# Patient Record
Sex: Female | Born: 1940
Health system: Southern US, Community
[De-identification: ages and names within clinical notes are randomized; demographics above are authoritative.]

## PROBLEM LIST (undated history)

## (undated) DIAGNOSIS — I482 Chronic atrial fibrillation, unspecified: Secondary | ICD-10-CM

## (undated) DIAGNOSIS — Z8701 Personal history of pneumonia (recurrent): Secondary | ICD-10-CM

## (undated) DIAGNOSIS — R931 Abnormal findings on diagnostic imaging of heart and coronary circulation: Secondary | ICD-10-CM

## (undated) DIAGNOSIS — S838X9A Sprain of other specified parts of unspecified knee, initial encounter: Secondary | ICD-10-CM

## (undated) DIAGNOSIS — J449 Chronic obstructive pulmonary disease, unspecified: Secondary | ICD-10-CM

## (undated) DIAGNOSIS — N189 Chronic kidney disease, unspecified: Secondary | ICD-10-CM

## (undated) DIAGNOSIS — Z7901 Long term (current) use of anticoagulants: Secondary | ICD-10-CM

## (undated) DIAGNOSIS — E049 Nontoxic goiter, unspecified: Secondary | ICD-10-CM

## (undated) DIAGNOSIS — E785 Hyperlipidemia, unspecified: Secondary | ICD-10-CM

## (undated) DIAGNOSIS — IMO0001 Reserved for inherently not codable concepts without codable children: Secondary | ICD-10-CM

## (undated) DIAGNOSIS — I4891 Unspecified atrial fibrillation: Secondary | ICD-10-CM

## (undated) DIAGNOSIS — M1710 Unilateral primary osteoarthritis, unspecified knee: Secondary | ICD-10-CM

## (undated) DIAGNOSIS — I428 Other cardiomyopathies: Secondary | ICD-10-CM

## (undated) DIAGNOSIS — M179 Osteoarthritis of knee, unspecified: Secondary | ICD-10-CM

## (undated) DIAGNOSIS — T148XXA Other injury of unspecified body region, initial encounter: Secondary | ICD-10-CM

## (undated) DIAGNOSIS — J4489 Other specified chronic obstructive pulmonary disease: Secondary | ICD-10-CM

## (undated) DIAGNOSIS — I10 Essential (primary) hypertension: Secondary | ICD-10-CM

## (undated) HISTORY — DX: Abnormal findings on diagnostic imaging of heart and coronary circulation: R93.1

## (undated) HISTORY — DX: Unspecified atrial fibrillation: I48.91

## (undated) HISTORY — DX: Reserved for inherently not codable concepts without codable children: IMO0001

## (undated) HISTORY — DX: Hyperlipidemia, unspecified: E78.5

## (undated) HISTORY — DX: Essential (primary) hypertension: I10

## (undated) HISTORY — DX: Chronic atrial fibrillation, unspecified: I48.20

## (undated) HISTORY — DX: Long term (current) use of anticoagulants: Z79.01

## (undated) HISTORY — DX: Nontoxic goiter, unspecified: E04.9

## (undated) HISTORY — DX: Osteoarthritis of knee, unspecified: M17.9

## (undated) HISTORY — DX: Unilateral primary osteoarthritis, unspecified knee: M17.10

---

## 1968-11-07 HISTORY — PX: BREAST ENHANCEMENT SURGERY: SHX7

## 1976-11-07 HISTORY — PX: VAGINAL HYSTERECTOMY: SUR661

## 1998-03-09 ENCOUNTER — Encounter: Admission: RE | Admit: 1998-03-09 | Discharge: 1998-06-07 | Payer: Self-pay | Admitting: Cardiology

## 1998-10-13 ENCOUNTER — Encounter (HOSPITAL_COMMUNITY): Admission: RE | Admit: 1998-10-13 | Discharge: 1999-01-11 | Payer: Self-pay | Admitting: Internal Medicine

## 1999-01-12 ENCOUNTER — Encounter (HOSPITAL_COMMUNITY): Admission: RE | Admit: 1999-01-12 | Discharge: 1999-04-12 | Payer: Self-pay | Admitting: Internal Medicine

## 2000-10-06 ENCOUNTER — Encounter: Admission: RE | Admit: 2000-10-06 | Discharge: 2001-01-04 | Payer: Self-pay | Admitting: Cardiology

## 2006-08-28 ENCOUNTER — Ambulatory Visit: Payer: Self-pay | Admitting: Internal Medicine

## 2006-11-07 DIAGNOSIS — IMO0001 Reserved for inherently not codable concepts without codable children: Secondary | ICD-10-CM

## 2006-11-07 HISTORY — DX: Reserved for inherently not codable concepts without codable children: IMO0001

## 2007-09-06 ENCOUNTER — Ambulatory Visit: Payer: Self-pay | Admitting: Internal Medicine

## 2007-11-21 ENCOUNTER — Telehealth (INDEPENDENT_AMBULATORY_CARE_PROVIDER_SITE_OTHER): Payer: Self-pay | Admitting: *Deleted

## 2008-06-30 ENCOUNTER — Encounter: Admission: RE | Admit: 2008-06-30 | Discharge: 2008-06-30 | Payer: Self-pay | Admitting: Cardiology

## 2008-07-04 ENCOUNTER — Encounter: Payer: Self-pay | Admitting: Internal Medicine

## 2008-09-04 ENCOUNTER — Ambulatory Visit: Payer: Self-pay | Admitting: Internal Medicine

## 2008-09-04 DIAGNOSIS — J209 Acute bronchitis, unspecified: Secondary | ICD-10-CM | POA: Insufficient documentation

## 2008-09-04 DIAGNOSIS — E1169 Type 2 diabetes mellitus with other specified complication: Secondary | ICD-10-CM

## 2008-09-04 DIAGNOSIS — I428 Other cardiomyopathies: Secondary | ICD-10-CM | POA: Insufficient documentation

## 2008-09-04 DIAGNOSIS — I4811 Longstanding persistent atrial fibrillation: Secondary | ICD-10-CM

## 2008-09-04 DIAGNOSIS — E669 Obesity, unspecified: Secondary | ICD-10-CM | POA: Insufficient documentation

## 2008-09-04 LAB — CONVERTED CEMR LAB: Theophylline Lvl: 4.6 ug/mL — ABNORMAL LOW (ref 10.0–20.0)

## 2008-09-09 ENCOUNTER — Telehealth (INDEPENDENT_AMBULATORY_CARE_PROVIDER_SITE_OTHER): Payer: Self-pay | Admitting: *Deleted

## 2008-11-07 DIAGNOSIS — R931 Abnormal findings on diagnostic imaging of heart and coronary circulation: Secondary | ICD-10-CM

## 2008-11-07 HISTORY — DX: Abnormal findings on diagnostic imaging of heart and coronary circulation: R93.1

## 2009-09-03 ENCOUNTER — Ambulatory Visit: Payer: Self-pay | Admitting: Internal Medicine

## 2009-09-03 DIAGNOSIS — J449 Chronic obstructive pulmonary disease, unspecified: Secondary | ICD-10-CM

## 2010-07-02 ENCOUNTER — Ambulatory Visit: Payer: Self-pay | Admitting: Cardiology

## 2010-08-04 ENCOUNTER — Ambulatory Visit: Payer: Self-pay | Admitting: Cardiology

## 2010-08-18 ENCOUNTER — Ambulatory Visit: Payer: Self-pay | Admitting: Cardiology

## 2010-09-02 ENCOUNTER — Ambulatory Visit: Payer: Self-pay | Admitting: Internal Medicine

## 2010-09-06 ENCOUNTER — Ambulatory Visit: Payer: Self-pay | Admitting: Cardiology

## 2010-10-13 ENCOUNTER — Ambulatory Visit: Payer: Self-pay | Admitting: Cardiology

## 2010-10-27 ENCOUNTER — Ambulatory Visit: Payer: Self-pay | Admitting: Cardiovascular Disease

## 2010-11-11 ENCOUNTER — Ambulatory Visit: Payer: Self-pay | Admitting: Cardiology

## 2010-12-07 NOTE — Assessment & Plan Note (Signed)
Summary: 1 year/ mbw   Primary Provider/Referring Provider:  [name]  CC:  yearly follow up visit-asthma; denies any SOB, wheezing, and attacks; uses rescue inhaler occasionally.[name]  History of Present Illness: HISTORY:  A 1-year followup.  Has had flu shots and has had pneumococcal vaccine twice.  Says breathing fine.  Only occasionally needs inhaler. No flare-ups or significant exacerbations.  She says a stress test went well recently.  We have reviewed her medications, particularly in the context of her atrial fibrillation.   09/04/08- Asthmatic bronchitis. Good year, no acute issues. Takes albuterol 4mg  tab every other day on average.Well tolerated- discussed heart rhythm  Rare use of Combivent. Discussed H1N1. had Flu vax.   September 03, 2009-  Asthmatic bronchitis She feels she is doing well and still follows closely with Dr Patty Sermons. Recent  cold shaken off without extra help. Uses her inhaler only occasionally. Denies nasal drainage or congestion. We discussed her vaccine status- has had Pneumovax twice. Remains on warfarin for chronic atirial fib.  September 02, 2010- Asthmatic bronchitis Nurse-CC: yearly follow up visit-asthma; denies any SOB, wheezing, attacks; uses rescue inhaler occasionally. Former smoker. Now working 3 days/ week at [employer]. She avoided summer heat and winter cold. some dry cough occasionally. Takes a little mucinex every other day. Uses daily theophylline. Rare need for rescue inhaler and infrequent use of Spiriva. She takes 1/2 albuterol tab daily. Had flu shot. Discussed theophylline and albuterol as oral meds.  Consider PFT, theophylline level, CXR on return.  Asthma History    Initial Asthma Severity Rating:    Age range: 12+ years    Symptoms: 0-2 days/week    Nighttime Awakenings: 0-2/month    Interferes w/ normal activity: no limitations    SABA use (not for EIB): 0-2 days/week    Asthma Severity Assessment:  Intermittent   Preventive Screening-Counseling & Management  Alcohol-Tobacco     Smoking Status: quit     Year Quit: 1989     Pack years: 66yrs, 2ppd  Current Medications (verified): 1)  Theophylline Cr 200 Mg Xr12h-Tab (Theophylline) .... Take 1 Tablet By Mouth Once A Day 2)  Albuterol Sulfate 4 Mg Tabs (Albuterol Sulfate) .[name].. 1 By Mouth Two Times A Day As Needed For Asthma 3)  Spiriva Handihaler 18 Mcg Caps (Tiotropium Bromide Monohydrate) .... Inhale Contents of 1 Capsule Once A Day 4)  Combivent 103-18 Mcg/act Aero (Ipratropium-Albuterol) .... Inhale 2 Puffs Every 4-6 Hours As Needed 5)  Bisoprolol Fumarate 5 Mg Tabs (Bisoprolol Fumarate) .... Take 1 Tablet By Mouth Once A Day 6)  Bayer Aspirin Ec Low Dose 81 Mg Tbec (Aspirin) .... Take 1 Tablet By Mouth Once A Day 7)  Warfarin Sodium 5 Mg Tabs (Warfarin Sodium) .... Per Clinic 8)  Furosemide 40 Mg Tabs (Furosemide) .... Take 1 Tablet By Mouth Once A Day 9)  Diovan Hct 320-25 Mg Tabs (Valsartan-Hydrochlorothiazide) .... Take 1 Tablet By Mouth Once A Day 10)  Lipitor 80 Mg Tabs (Atorvastatin Calcium) .... Take 1 Tab By Mouth At Bedtime 11)  Metformin Hcl 1000 Mg Tabs (Metformin Hcl) .... Take 1 Tablet By Mouth Two Times A Day 12)  Metanx 2.8-25-2 Mg Tabs (L-Methylfolate-B6-B12) .... Take 1 Tablet By Mouth Two Times A Day 13)  Actos 45 Mg Tabs (Pioglitazone Hcl) .... Take 1 Tablet By Mouth Once A Day 14)  Januvia 100 Mg Tabs (Sitagliptin Phosphate) .... Take 1 Tablet By Mouth Once A Day 15)  Lanoxin 0.125 Mg Tabs (Digoxin) .Marland KitchenMarland KitchenMarland Kitchen  Take 1 Tablet By Mouth Once A Day 16)  Vitamin D 1000 Unit Tabs (Cholecalciferol) .... Take 1 Tablet By Mouth Once A Day 17)  Mucinex Dm 30-600 Mg Xr12h-Tab (Dextromethorphan-Guaifenesin) .[name].. 1-2 Every 12 Hours As Needed 18)  Calcium Carbonate-Vitamin D 600-400 Mg-Unit  Tabs (Calcium Carbonate-Vitamin D) .... Take 1 Tablet By Mouth Two Times A Day 19)  Fish Oil 1000 Mg Caps (Omega-3 Fatty Acids) .... Take 1  Capsule By Mouth Once A Day 20)  Multivitamins   Tabs (Multiple Vitamin) .... Take 1 Tablet By Mouth Once A Day 21)  Black Cohosh 40 Mg Caps (Black Cohosh) .[name].. 1 Once Daily 22)  Vitamin E 200 Unit Caps (Vitamin E) .[name].. 1 Once Daily 23)  Super B Complex  Tabs (B Complex-C) .[name].. 1 Once Daily 24)  Flax Seed Oil 1000 Mg Caps (Flaxseed (Linseed)) .[name].. 1 Once Daily 25)  Lantus 100 Unit/ml Soln (Insulin Glargine) .... Inject 15 Units At Bedtime  Allergies (verified): 1)  ! Sulfa  Past History:  Past Medical History: Last updated: October 02, 2008 DIABETES MELLITUS (ICD-250.00) CARDIOMYOPATHY (ICD-425.4) ATRIAL FIBRILLATION (ICD-427.31) Hx of ASTHMATIC BRONCHITIS, ACUTE (ICD-466.0)  Past Surgical History: Last updated: 09/03/2009 Tonsillectomy Total Abdominal Hysterectomy  Family History: Last updated: 10/02/2008 father died of congestive heart failure, emphysema, arthritis mother died of stroke, arthritis brother died of heart disease, diabetes 2 other siblings healthy  Social History: Last updated: 10/02/2008 former smoker occasionally exercises drinks 1 cup caffeine a day widowed 1 child rare etoh part time at [employer]  Risk Factors: Smoking Status: quit (09/02/2010)  Review of Systems      See HPI       The patient complains of non-productive cough.  The patient denies shortness of breath with activity, shortness of breath at rest, productive cough, coughing up blood, chest pain, irregular heartbeats, acid heartburn, indigestion, loss of appetite, weight change, abdominal pain, difficulty swallowing, sore throat, tooth/dental problems, headaches, nasal congestion/difficulty breathing through nose, hand/feet swelling, rash, change in color of mucus, and fever.    Vital Signs:  Patient profile:   70 year old female Height:      64 inches Weight:      183.38 pounds BMI:     31.59 O2 Sat:      99 % on Room air Pulse rate:   71 / minute BP sitting:   110 / 68  (left  arm) Cuff size:   regular  Vitals Entered By: Reynaldo Minium CMA (September 02, 2010 10:03 AM)  O2 Flow:  Room air CC: yearly follow up visit-asthma; denies any SOB, wheezing, attacks; uses rescue inhaler occasionally.   Physical Exam  Additional Exam:  General: A/Ox3; pleasant and cooperative, NAD, SKIN: no rash, lesions NODES: no lymphadenopathy HEENT: [place]/AT, EOM- WNL, Conjuctivae- clear, PERRLA, TM-WNL, Nose- clear, Throat- clear and wnl, Mallampati II, no erythema NECK: Supple w/ fair ROM, JVD- none, normal carotid impulses w/o bruits Thyroid- normal to palpation CHEST: Clear to P&A, diminished, unlabored without cough or wheeze. HEART: IRR (AF), no m/g/r heard. Pulse remains irregular. ABDOMEN: Soft and nl; AOZ:HYQM, nl pulses, no edema . walking shoe on right foot for a "corn" NEURO: Grossly intact to observation      Impression & Recommendations:  Problem # 1:  ASTHMA (ICD-493.90) Mild intermittent and very well controlled now. It is interesting that she does so well with oral meds. We discussed her meds, especially systemic oral bronchodilators,  and chose to leave well enough alone.   Medications  Added to Medication List This Visit: 1)  Lantus 100 Unit/ml Soln (Insulin glargine) .... Inject 15 units at bedtime  Other Orders: Est. Patient Level III (16109)  Patient Instructions: 1)  Please schedule a follow-up appointment in 1 year. 2)  Meds refilled Prescriptions: COMBIVENT 103-18 MCG/ACT AERO (IPRATROPIUM-ALBUTEROL) inhale 2 puffs every 4-6 hours as needed  #15 Gram x prn   Entered and Authorized by:   Waymon Budge MD   Signed by:   Waymon Budge MD on 09/02/2010   Method used:   Print then Give to Patient   RxID:   [id number] SPIRIVA HANDIHALER 18 MCG CAPS (TIOTROPIUM BROMIDE MONOHYDRATE) Inhale contents of 1 capsule once a day  #30 x prn   Entered and Authorized by:   Waymon Budge MD   Signed by:   Waymon Budge MD on 09/02/2010   Method used:    Print then Give to Patient   RxID:   [id number] ALBUTEROL SULFATE 4 MG TABS (ALBUTEROL SULFATE) 1 by mouth two times a day as needed for asthma  #180 Tablet x prn   Entered and Authorized by:   Waymon Budge MD   Signed by:   Waymon Budge MD on 09/02/2010   Method used:   Print then Give to Patient   RxID:   [id number] THEOPHYLLINE CR 200 MG XR12H-TAB (THEOPHYLLINE) Take 1 tablet by mouth once a day  #180 Tablet x prn   Entered and Authorized by:   Waymon Budge MD   Signed by:   Waymon Budge MD on 09/02/2010   Method used:   Print then Give to Patient   RxID:   [id number]    Immunization History:  Influenza Immunization History:    Influenza:  historical (08/10/2010)

## 2010-12-10 ENCOUNTER — Other Ambulatory Visit (INDEPENDENT_AMBULATORY_CARE_PROVIDER_SITE_OTHER): Payer: Medicare Other

## 2010-12-10 DIAGNOSIS — I4891 Unspecified atrial fibrillation: Secondary | ICD-10-CM

## 2011-01-07 ENCOUNTER — Ambulatory Visit (INDEPENDENT_AMBULATORY_CARE_PROVIDER_SITE_OTHER): Payer: Medicare Other | Admitting: Cardiology

## 2011-01-07 DIAGNOSIS — Z79899 Other long term (current) drug therapy: Secondary | ICD-10-CM

## 2011-01-07 DIAGNOSIS — I4891 Unspecified atrial fibrillation: Secondary | ICD-10-CM

## 2011-01-07 DIAGNOSIS — Z7901 Long term (current) use of anticoagulants: Secondary | ICD-10-CM

## 2011-01-07 DIAGNOSIS — E119 Type 2 diabetes mellitus without complications: Secondary | ICD-10-CM

## 2011-01-07 DIAGNOSIS — I119 Hypertensive heart disease without heart failure: Secondary | ICD-10-CM

## 2011-02-05 ENCOUNTER — Other Ambulatory Visit: Payer: Self-pay | Admitting: Cardiology

## 2011-02-05 DIAGNOSIS — E7800 Pure hypercholesterolemia, unspecified: Secondary | ICD-10-CM

## 2011-02-07 ENCOUNTER — Ambulatory Visit (INDEPENDENT_AMBULATORY_CARE_PROVIDER_SITE_OTHER): Payer: Medicare Other | Admitting: *Deleted

## 2011-02-07 DIAGNOSIS — I4891 Unspecified atrial fibrillation: Secondary | ICD-10-CM

## 2011-02-07 DIAGNOSIS — Z7901 Long term (current) use of anticoagulants: Secondary | ICD-10-CM

## 2011-02-07 LAB — POCT INR
INR: 1.9
INR: 1.9

## 2011-03-10 ENCOUNTER — Ambulatory Visit (INDEPENDENT_AMBULATORY_CARE_PROVIDER_SITE_OTHER): Payer: Medicare Other | Admitting: *Deleted

## 2011-03-10 DIAGNOSIS — I4891 Unspecified atrial fibrillation: Secondary | ICD-10-CM

## 2011-03-10 LAB — POCT INR: INR: 2.8

## 2011-03-17 ENCOUNTER — Encounter: Payer: Medicare Other | Admitting: *Deleted

## 2011-03-22 NOTE — Assessment & Plan Note (Signed)
[facility]                             PULMONARY OFFICE NOTE   NAME:Renee Ray                     MRN:          [id number]  DATE:09/06/2007                            DOB:          08-21-1941    PULMONARY FOLLOWUP   PROBLEMS:  1. Asthmatic bronchitis.  2. Atrial fibrillation/cardiomyopathy/Coumadin.  3. Diabetes.   HISTORY:  A 1-year followup.  Has had flu shots and has had pneumococcal  vaccine twice.  Says breathing fine.  Only occasionally needs inhaler.  No flare-ups or significant exacerbations.  She says a stress test went  well recently.  We have reviewed her medications, particularly in the  context of her atrial fibrillation.   MEDICATIONS:  1. Theophylline 200 mg once daily.  2. Albuterol 4 mg tablet once daily.  3. Spiriva used p.r.n.  4. Combivent inhaler used as a rescue inhaler maybe once a week.  5. Bisoprolol 5 mg.  6. Aspirin 81 mg.  7. Digoxin 125 mcg.  8. Warfarin 5 mg.  9. Furosemide 40 mg.  10.Diovan hydrochlorothiazide 160/12.5.  11.Lipitor 40 mg.  12.Glyburide 2.5 mg b.i.d.  13.Metanx b.i.d.   She also takes a variety of vitamins and herbal medications, including  occasional Mucinex.   DRUG INTOLERANCES:  SULFA.   OBJECTIVE:  Weight 185 pounds, BP 124/80, pulse 79, room air saturation  98%.  Pulse is very regular to palpation, but I think represents well-  controlled atrial fibrillation.  There is no neck vein distension or  edema.  Chest is quiet and clear.  She looks happy and comfortable.   IMPRESSION:  Stable asthma/chronic obstructive pulmonary disease.  Atrial fibrillation with well-controlled ventricular response rate.   PLAN:  Chest x-ray.  We refilled her medications suggesting that she try  tapering away from the albuterol and Theophylline tablets.  She has been  so very stable that we are reluctant to rock the boat, but she  understands the advantage of getting away from these if  possible.  We  also discussed the change to [facility] propellants and, as a replacement for  Combivent, I am giving sample Xopenex HFA 2 puffs q.i.d. p.r.n.  We have  refilled Spiriva.  Schedule return in 1 year, earlier p.r.n.     Clinton D. Maple Hudson, MD, Tonny Bollman, FACP  Electronically Signed    CDY/MedQ  DD: 09/07/2007  DT: 09/07/2007  Job #: [id number]   cc:   Cassell Clement, M.D.

## 2011-03-25 NOTE — Assessment & Plan Note (Signed)
[facility]                               PULMONARY OFFICE NOTE   NAME:Renee Ray, Renee Ray                     MRN:          [id number]  DATE:08/28/2006                            DOB:          06-13-1941    PROBLEM LIST:  1. Asthmatic bronchitis.  2. Atrial fibrillation/cardiomyopathy/Coumadin.  3. Diabetes.   HISTORY:  This is a former smoker previously followed at [facility] and coming now to establish at this practice with me for continuity.  She is the widow of Dr. Ples Specter. She had stopped smoking many years  ago and has been doing extremely well in recent years although at one point  she was in such severe distress at home that her husband seriously  considered doing an emergency tracheostomy in the kitchen. Fortunately he  did not. She had participated in pulmonary rehabilitation. She now feels her  breathing is stable on present therapy and she has been working part time as  a Heritage manager at [employer].   PAST HISTORY:  1. Gallstone pancreatitis in 1994.  2. Spontaneous atrial fibrillation starting around 1992.  3. Removal of silicone breast implants.  4. Hysterectomy.  5. Pulmonary function tests in 2005 had included an FVC of 4600 (85%),      FEV1 of 1600 (67%), with ratio 0.62 consistent with moderate      obstructive airways disease at that time.  6. She has gotten annual flu shots. I do not have dates for Pneumococcal      vaccines administered.   MEDICATIONS:  1. Bisoprolol 5 mg.  2. Combivent 2 puffs x4 p.r.n.  3. Digoxin 125 mcg.  4. Diovan/HCT 160/12.5.  5. Furosemide 40 mg.  6. Glimepiride 2 mg.  7. Hydrocortisone skin cream 0.2%.  8. Metformin 1000 mg.  9. Spiriva inhaler one puff daily.  10.Theophylline 200 mg b.i.d.  11.VoSpire 4 mg b.i.d. p.r.n.  12.Warfarin 5 mg as directed.  13.She has had shingle's vaccine.   ALLERGIES:  DRUG INTOLERANCE OF SULFA.   OBJECTIVE:  VITAL SIGNS: Weight 186  pounds. Blood pressure 110/64, pulse  regular, 112. Room air saturation 97%.  GENERAL:  She looks alert and comfortable.  NECK: There is no neck vein distention or peripheral edema. I do not find  adenopathy, distention or peripheral edema.  LUNGS:  Sounds are somewhat diminished and may be a little bit slow on  expiration but do not hear rales, wheeze, crackles or rhonchi and she is not  using accessory muscles.  CARDIAC:  Heart sounds are slightly irregular without murmur or gallop.  There is no obvious acute process.   IMPRESSION:  1.Stable mild to moderate COPD.  1. Chronic atrial fibrillation with controlled ventricular response.   PLAN:  1. We discussed medications and follow up status.  2. Flu vaccine.  3. Schedule return for 1 year follow up, earlier p.r.n.     Clinton D. Maple Hudson, MD, FCCP, FACP    CDY/MedQ  DD: 09/01/2006  DT: 09/03/2006  Job #: [id number]   cc:   Cassell Clement, M.D.

## 2011-04-07 ENCOUNTER — Ambulatory Visit (INDEPENDENT_AMBULATORY_CARE_PROVIDER_SITE_OTHER): Payer: Medicare Other | Admitting: *Deleted

## 2011-04-07 DIAGNOSIS — I4891 Unspecified atrial fibrillation: Secondary | ICD-10-CM

## 2011-04-07 LAB — POCT INR: INR: 2.4

## 2011-05-03 ENCOUNTER — Telehealth: Payer: Self-pay | Admitting: *Deleted

## 2011-05-03 NOTE — Telephone Encounter (Signed)
Patient phoned requesting to cancel INR for 6/28 since she had an appointment for INR & OV 7/9.  Discussed with Dr Patty Sermons and ok to (last INR 2.4)

## 2011-05-05 ENCOUNTER — Encounter: Payer: Medicare Other | Admitting: *Deleted

## 2011-05-06 ENCOUNTER — Encounter: Payer: Self-pay | Admitting: Cardiology

## 2011-05-16 ENCOUNTER — Encounter (INDEPENDENT_AMBULATORY_CARE_PROVIDER_SITE_OTHER): Payer: Medicare Other | Admitting: *Deleted

## 2011-05-16 ENCOUNTER — Ambulatory Visit (INDEPENDENT_AMBULATORY_CARE_PROVIDER_SITE_OTHER): Payer: Medicare Other | Admitting: Cardiology

## 2011-05-16 ENCOUNTER — Encounter: Payer: Self-pay | Admitting: Cardiology

## 2011-05-16 VITALS — BP 120/70 | HR 80 | Wt 177.0 lb

## 2011-05-16 DIAGNOSIS — I4891 Unspecified atrial fibrillation: Secondary | ICD-10-CM

## 2011-05-16 DIAGNOSIS — I10 Essential (primary) hypertension: Secondary | ICD-10-CM

## 2011-05-16 DIAGNOSIS — I119 Hypertensive heart disease without heart failure: Secondary | ICD-10-CM | POA: Insufficient documentation

## 2011-05-16 DIAGNOSIS — E119 Type 2 diabetes mellitus without complications: Secondary | ICD-10-CM

## 2011-05-16 LAB — POCT INR: INR: 3.2

## 2011-05-16 MED ORDER — LOSARTAN POTASSIUM-HCTZ 100-25 MG PO TABS: 1.0000 | ORAL_TABLET | Freq: Every day | ORAL | Status: DC

## 2011-05-16 NOTE — Assessment & Plan Note (Signed)
The patient has a history of established atrial fibrillation.  She had a remote history of unsuccessful attempts at cardioversion.  She has not been experiencing any chest pain or increased shortness of breath.  She is on Coumadin.  She's not had any thromboembolic events.

## 2011-05-16 NOTE — Assessment & Plan Note (Signed)
The patient has a past history of essential hypertension.  She had been on Diovan HCT but this is becoming expensive for her.  For this reason we are switching her to losartan HCT .  The patient is not having any headaches or dizzy spells.  He's had no syncope.

## 2011-05-16 NOTE — Progress Notes (Signed)
Renee Ray Date of Birth:  [birth year] [facility] [address]   [address] [place], [place]  [address] [phone]           Fax   [phone]  HPI: This pleasant 70 year old woman is seen for a four-month followup office visit.  She has a history of established atrial fibrillation.  She also has a history of essential hypertension and diabetes she's been feeling well with no new cardiac symptoms.  She denies chest pain or shortness of breath.  she does not have any history of ischemic heart disease.  She had a normal adenosine Cardiolite stress test on 07/05/07.Her last echocardiogram was over 04/30/09 and showed slightly enlarged left atrium and normal left ventricular systolic function and normal pulmonary artery pressure  Current Outpatient Prescriptions  Medication Sig Dispense Refill  . aspirin 81 MG tablet Take 81 mg by mouth daily.        [name] atorvastatin (LIPITOR) 80 MG tablet TAKE 1 TABLET BY MOUTH EVERY DAY  30 tablet  11  . b complex vitamins capsule Take 1 capsule by mouth daily.        . bisoprolol (ZEBETA) 5 MG tablet Take 5 mg by mouth daily.        [name] BLACK COHOSH PO Take by mouth daily.        . digoxin (LANOXIN) 0.125 MG tablet Take 125 mcg by mouth daily.        . fish oil-omega-3 fatty acids 1000 MG capsule Take 1 g by mouth daily.        . Flaxseed, Linseed, (FLAXSEED OIL PO) Take by mouth daily.        . furosemide (LASIX) 40 MG tablet Take 40 mg by mouth daily.        . insulin glargine (LANTUS) 100 UNIT/ML injection Inject into the skin at bedtime. Taking 24units daily      . Ipratropium-Albuterol (COMBIVENT IN) Inhale into the lungs as needed.        . metFORMIN (GLUCOPHAGE) 1000 MG tablet Take 1,000 mg by mouth 2 (two) times daily with a meal.        . Multiple Vitamin (MULTIVITAMIN) tablet Take 1 tablet by mouth daily.        . sitaGLIPtin (JANUVIA) 100 MG tablet Take 100 mg by mouth daily.        . theophylline (THEOPHYLLINE) 200  MG 12 hr tablet Take 200 mg by mouth daily.        [name] tiotropium (SPIRIVA) 18 MCG inhalation capsule Place 18 mcg into inhaler and inhale as needed.        . warfarin (COUMADIN) 5 MG tablet Take 5 mg by mouth as directed.        [name] DISCONTD: valsartan-hydrochlorothiazide (DIOVAN-HCT) 320-25 MG per tablet Take 1 tablet by mouth daily.        [name] losartan-hydrochlorothiazide (HYZAAR) 100-25 MG per tablet Take 1 tablet by mouth daily.  90 tablet  3  . DISCONTD: pioglitazone (ACTOS) 45 MG tablet Take 45 mg by mouth daily.          Allergies  Allergen Reactions  . Sulfonamide Derivatives     REACTION: thrush    Patient Active Problem List  Diagnoses  . DIABETES MELLITUS  . CARDIOMYOPATHY  . ATRIAL FIBRILLATION  . ASTHMATIC BRONCHITIS, ACUTE  . ASTHMA  . Benign hypertensive heart disease without heart failure    History  Smoking status  . Former Smoker  .  Quit date: 05/05/1996  Smokeless tobacco  . Not on file    History  Alcohol Use No    Family History  Problem Relation Age of Onset  . Arrhythmia Mother   . Heart failure Mother   . Heart attack Father   . Diabetes Brother     Review of Systems: The patient denies any heat or cold intolerance.  No weight gain or weight loss.  The patient denies headaches or blurry vision.  There is no cough or sputum production.  The patient denies dizziness.  There is no hematuria or hematochezia.  The patient denies any muscle aches or arthritis.  The patient denies any rash.  The patient denies frequent falling or instability.  There is no history of depression or anxiety.  All other systems were reviewed and are negative.   Physical Exam: Filed Vitals:   05/16/11 0842  BP: 120/70  Pulse: 80  The general appearance feels a well-developed well-nourished woman in no distress.Pupils equal and reactive.   Extraocular Movements are full.  There is no scleral icterus.  The mouth and pharynx are normal.  The neck is supple.  The carotids  reveal no bruits.  The jugular venous pressure is normal.  The thyroid is not enlarged.  There is no lymphadenopathy.The chest is clear to percussion and auscultation. There are no rales or rhonchi. Expansion of the chest is symmetrical.  The heart reveals an irregular rhythm.  No murmur gallop or rub.The abdomen is soft and nontender. Bowel sounds are normal. The liver and spleen are not enlarged. There Are no abdominal masses. There are no bruits.The pedal pulses are good.  There is no phlebitis or edema.  There is no cyanosis or clubbing.Strength is normal and symmetrical in all extremities.  There is no lateralizing weakness.  There are no sensory deficits.The skin is warm and dry.  There is no rash.    Assessment / Plan: The patient appears to be doing well.  She will continue same medication.  We are switching her from Diovan HCT to losartan HCT because of cost.   recheck in 4 months for followup office visit

## 2011-05-16 NOTE — Assessment & Plan Note (Signed)
The patient is now followed for diabetes by [name].  He recently stopped her Actos.  She was pleased about that because it appeared to be preventing her from losing weight.  Now her weight has started to come down to

## 2011-05-17 ENCOUNTER — Ambulatory Visit (INDEPENDENT_AMBULATORY_CARE_PROVIDER_SITE_OTHER): Payer: Medicare Other | Admitting: *Deleted

## 2011-05-17 DIAGNOSIS — I4891 Unspecified atrial fibrillation: Secondary | ICD-10-CM

## 2011-06-13 ENCOUNTER — Ambulatory Visit (INDEPENDENT_AMBULATORY_CARE_PROVIDER_SITE_OTHER): Payer: Medicare Other | Admitting: *Deleted

## 2011-06-13 ENCOUNTER — Encounter: Payer: Medicare Other | Admitting: *Deleted

## 2011-06-13 DIAGNOSIS — I4891 Unspecified atrial fibrillation: Secondary | ICD-10-CM

## 2011-06-13 LAB — POCT INR: INR: 2.8

## 2011-06-24 ENCOUNTER — Other Ambulatory Visit: Payer: Self-pay | Admitting: *Deleted

## 2011-06-24 DIAGNOSIS — I4891 Unspecified atrial fibrillation: Secondary | ICD-10-CM

## 2011-06-24 MED ORDER — DIGOXIN 125 MCG PO TABS: 125.0000 ug | ORAL_TABLET | Freq: Every day | ORAL | Status: DC

## 2011-07-14 ENCOUNTER — Ambulatory Visit (INDEPENDENT_AMBULATORY_CARE_PROVIDER_SITE_OTHER): Payer: Medicare Other | Admitting: *Deleted

## 2011-07-14 DIAGNOSIS — I4891 Unspecified atrial fibrillation: Secondary | ICD-10-CM

## 2011-08-05 ENCOUNTER — Other Ambulatory Visit: Payer: Self-pay | Admitting: *Deleted

## 2011-08-05 DIAGNOSIS — I119 Hypertensive heart disease without heart failure: Secondary | ICD-10-CM

## 2011-08-05 MED ORDER — FUROSEMIDE 40 MG PO TABS: 40.0000 mg | ORAL_TABLET | Freq: Every day | ORAL | Status: DC

## 2011-08-05 NOTE — Telephone Encounter (Signed)
Refilled meds per fax request.  

## 2011-08-09 ENCOUNTER — Telehealth: Payer: Self-pay | Admitting: Cardiology

## 2011-08-09 ENCOUNTER — Inpatient Hospital Stay (HOSPITAL_COMMUNITY)
Admission: EM | Admit: 2011-08-09 | Discharge: 2011-08-15 | DRG: 682 | Disposition: A | Discharge status: Home / Self Care | Payer: Medicare Other | Source: Ambulatory Visit | Attending: Pulmonary Disease | Admitting: Pulmonary Disease

## 2011-08-09 ENCOUNTER — Emergency Department (HOSPITAL_COMMUNITY): Payer: Medicare Other

## 2011-08-09 DIAGNOSIS — N179 Acute kidney failure, unspecified: Principal | ICD-10-CM | POA: Diagnosis present

## 2011-08-09 DIAGNOSIS — M79609 Pain in unspecified limb: Secondary | ICD-10-CM

## 2011-08-09 DIAGNOSIS — E872 Acidosis, unspecified: Secondary | ICD-10-CM | POA: Diagnosis present

## 2011-08-09 DIAGNOSIS — J441 Chronic obstructive pulmonary disease with (acute) exacerbation: Secondary | ICD-10-CM | POA: Diagnosis present

## 2011-08-09 DIAGNOSIS — J9600 Acute respiratory failure, unspecified whether with hypoxia or hypercapnia: Secondary | ICD-10-CM | POA: Diagnosis present

## 2011-08-09 DIAGNOSIS — M6282 Rhabdomyolysis: Secondary | ICD-10-CM | POA: Diagnosis present

## 2011-08-09 DIAGNOSIS — A498 Other bacterial infections of unspecified site: Secondary | ICD-10-CM | POA: Diagnosis present

## 2011-08-09 DIAGNOSIS — Z7901 Long term (current) use of anticoagulants: Secondary | ICD-10-CM

## 2011-08-09 DIAGNOSIS — J45901 Unspecified asthma with (acute) exacerbation: Secondary | ICD-10-CM | POA: Diagnosis present

## 2011-08-09 DIAGNOSIS — Z7982 Long term (current) use of aspirin: Secondary | ICD-10-CM

## 2011-08-09 DIAGNOSIS — A419 Sepsis, unspecified organism: Secondary | ICD-10-CM | POA: Diagnosis present

## 2011-08-09 DIAGNOSIS — I4891 Unspecified atrial fibrillation: Secondary | ICD-10-CM | POA: Diagnosis present

## 2011-08-09 DIAGNOSIS — I428 Other cardiomyopathies: Secondary | ICD-10-CM | POA: Diagnosis present

## 2011-08-09 DIAGNOSIS — J159 Unspecified bacterial pneumonia: Secondary | ICD-10-CM

## 2011-08-09 DIAGNOSIS — E860 Dehydration: Secondary | ICD-10-CM | POA: Diagnosis present

## 2011-08-09 DIAGNOSIS — Z794 Long term (current) use of insulin: Secondary | ICD-10-CM

## 2011-08-09 DIAGNOSIS — J189 Pneumonia, unspecified organism: Secondary | ICD-10-CM | POA: Diagnosis present

## 2011-08-09 DIAGNOSIS — J449 Chronic obstructive pulmonary disease, unspecified: Secondary | ICD-10-CM

## 2011-08-09 DIAGNOSIS — M7989 Other specified soft tissue disorders: Secondary | ICD-10-CM

## 2011-08-09 DIAGNOSIS — M629 Disorder of muscle, unspecified: Secondary | ICD-10-CM

## 2011-08-09 DIAGNOSIS — N390 Urinary tract infection, site not specified: Secondary | ICD-10-CM | POA: Diagnosis present

## 2011-08-09 DIAGNOSIS — E119 Type 2 diabetes mellitus without complications: Secondary | ICD-10-CM | POA: Diagnosis present

## 2011-08-09 LAB — URINALYSIS, ROUTINE W REFLEX MICROSCOPIC
Glucose, UA: NEGATIVE mg/dL
Specific Gravity, Urine: 1.015 (ref 1.005–1.030)
Urobilinogen, UA: 0.2 mg/dL (ref 0.0–1.0)
pH: 5.5 (ref 5.0–8.0)

## 2011-08-09 LAB — COMPREHENSIVE METABOLIC PANEL
ALT: 366 U/L — ABNORMAL HIGH (ref 0–35)
AST: 955 U/L — ABNORMAL HIGH (ref 0–37)
Albumin: 3.1 g/dL — ABNORMAL LOW (ref 3.5–5.2)
Alkaline Phosphatase: 100 U/L (ref 39–117)
Calcium: 9.2 mg/dL (ref 8.4–10.5)
GFR calc Af Amer: 10 mL/min — ABNORMAL LOW (ref >90)
Potassium: 4 mEq/L (ref 3.5–5.1)
Sodium: 138 mEq/L (ref 135–145)
Total Protein: 7.6 g/dL (ref 6.0–8.3)

## 2011-08-09 LAB — DIFFERENTIAL
Eosinophils Absolute: 0 10*3/uL (ref 0.0–0.7)
Eosinophils Relative: 0 % (ref 0–5)
Lymphocytes Relative: 9 % — ABNORMAL LOW (ref 12–46)
Lymphs Abs: 1.2 10*3/uL (ref 0.7–4.0)
Monocytes Absolute: 1.7 10*3/uL — ABNORMAL HIGH (ref 0.1–1.0)
Monocytes Relative: 13 % — ABNORMAL HIGH (ref 3–12)
Myelocytes: 0 %
Neutro Abs: 10.2 10*3/uL — ABNORMAL HIGH (ref 1.7–7.7)
Neutrophils Relative %: 78 % — ABNORMAL HIGH (ref 43–77)
nRBC: 0 /100 WBC

## 2011-08-09 LAB — POCT I-STAT TROPONIN I

## 2011-08-09 LAB — URINE MICROSCOPIC-ADD ON

## 2011-08-09 LAB — CBC
HCT: 33.8 % — ABNORMAL LOW (ref 36.0–46.0)
MCH: 25.7 pg — ABNORMAL LOW (ref 26.0–34.0)
MCV: 78.2 fL (ref 78.0–100.0)
RDW: 15.8 % — ABNORMAL HIGH (ref 11.5–15.5)
WBC: 13.1 10*3/uL — ABNORMAL HIGH (ref 4.0–10.5)

## 2011-08-09 LAB — MRSA PCR SCREENING: MRSA by PCR: NEGATIVE

## 2011-08-09 NOTE — Telephone Encounter (Signed)
Agree with advice given.  Dr. Arnoldo Lenis, urgent care called me and is sending her to the emergency room.  Her white count is 18,000, and she is very short of breath

## 2011-08-09 NOTE — Telephone Encounter (Signed)
Spoke with patient and she has had a fever since Saturday with Tylenol.  Larey Seat in her bathroom Sunday night and was there all night before she was able to get someone to come over.  Had to call 911 for them to help her up.  Very sore and still has chest congestion.  Advised for her to go to urgent care.  They would be able to do xray, check for flu, and check her INR.  Stated she would.  Will call back Thursday with update

## 2011-08-09 NOTE — Telephone Encounter (Signed)
Pt has a virus and has had it since Saturday and she wants to know what to do. She has only taken tylenol

## 2011-08-10 ENCOUNTER — Inpatient Hospital Stay (HOSPITAL_COMMUNITY): Payer: Medicare Other

## 2011-08-10 DIAGNOSIS — I369 Nonrheumatic tricuspid valve disorder, unspecified: Secondary | ICD-10-CM

## 2011-08-10 LAB — BASIC METABOLIC PANEL
CO2: 18 mEq/L — ABNORMAL LOW (ref 19–32)
Calcium: 8 mg/dL — ABNORMAL LOW (ref 8.4–10.5)
Creatinine, Ser: 4.83 mg/dL — ABNORMAL HIGH (ref 0.50–1.10)
Glucose, Bld: 173 mg/dL — ABNORMAL HIGH (ref 70–99)

## 2011-08-10 LAB — GLUCOSE, CAPILLARY
Glucose-Capillary: 247 mg/dL — ABNORMAL HIGH (ref 70–99)
Glucose-Capillary: 252 mg/dL — ABNORMAL HIGH (ref 70–99)

## 2011-08-10 LAB — CBC
Hemoglobin: 9.3 g/dL — ABNORMAL LOW (ref 12.0–15.0)
MCH: 25.1 pg — ABNORMAL LOW (ref 26.0–34.0)
MCHC: 32.5 g/dL (ref 30.0–36.0)
MCV: 77.3 fL — ABNORMAL LOW (ref 78.0–100.0)
Platelets: 247 10*3/uL (ref 150–400)
RBC: 3.7 MIL/uL — ABNORMAL LOW (ref 3.87–5.11)

## 2011-08-10 LAB — CARDIAC PANEL(CRET KIN+CKTOT+MB+TROPI): Total CK: 24864 U/L — ABNORMAL HIGH (ref 7–177)

## 2011-08-10 LAB — PROCALCITONIN: Procalcitonin: 91.11 ng/mL

## 2011-08-10 LAB — CK: Total CK: 20076 U/L — ABNORMAL HIGH (ref 7–177)

## 2011-08-10 LAB — LEGIONELLA ANTIGEN, URINE: Legionella Antigen, Urine: NEGATIVE

## 2011-08-11 ENCOUNTER — Inpatient Hospital Stay (HOSPITAL_COMMUNITY): Payer: Medicare Other

## 2011-08-11 ENCOUNTER — Encounter: Payer: Medicare Other | Admitting: *Deleted

## 2011-08-11 DIAGNOSIS — N179 Acute kidney failure, unspecified: Secondary | ICD-10-CM

## 2011-08-11 DIAGNOSIS — J45901 Unspecified asthma with (acute) exacerbation: Secondary | ICD-10-CM

## 2011-08-11 DIAGNOSIS — J189 Pneumonia, unspecified organism: Secondary | ICD-10-CM

## 2011-08-11 DIAGNOSIS — A419 Sepsis, unspecified organism: Secondary | ICD-10-CM

## 2011-08-11 LAB — COMPREHENSIVE METABOLIC PANEL
ALT: 299 U/L — ABNORMAL HIGH (ref 0–35)
CO2: 16 mEq/L — ABNORMAL LOW (ref 19–32)
Calcium: 8.5 mg/dL (ref 8.4–10.5)
Chloride: 103 mEq/L (ref 96–112)
Creatinine, Ser: 4.94 mg/dL — ABNORMAL HIGH (ref 0.50–1.10)
GFR calc Af Amer: 9 mL/min — ABNORMAL LOW (ref >90)
GFR calc non Af Amer: 8 mL/min — ABNORMAL LOW (ref >90)
Glucose, Bld: 156 mg/dL — ABNORMAL HIGH (ref 70–99)
Sodium: 136 mEq/L (ref 135–145)
Total Bilirubin: 0.3 mg/dL (ref 0.3–1.2)

## 2011-08-11 LAB — PROTIME-INR
INR: 2.39 — ABNORMAL HIGH (ref 0.00–1.49)
Prothrombin Time: 26.5 seconds — ABNORMAL HIGH (ref 11.6–15.2)

## 2011-08-11 LAB — CARDIAC PANEL(CRET KIN+CKTOT+MB+TROPI): Total CK: 19539 U/L — ABNORMAL HIGH (ref 7–177)

## 2011-08-11 LAB — CBC
HCT: 29 % — ABNORMAL LOW (ref 36.0–46.0)
MCHC: 32.1 g/dL (ref 30.0–36.0)
RDW: 15.8 % — ABNORMAL HIGH (ref 11.5–15.5)

## 2011-08-11 LAB — GLUCOSE, CAPILLARY: Glucose-Capillary: 155 mg/dL — ABNORMAL HIGH (ref 70–99)

## 2011-08-11 LAB — URINE CULTURE

## 2011-08-12 LAB — COMPREHENSIVE METABOLIC PANEL
ALT: 286 U/L — ABNORMAL HIGH (ref 0–35)
AST: 326 U/L — ABNORMAL HIGH (ref 0–37)
Alkaline Phosphatase: 93 U/L (ref 39–117)
CO2: 20 mEq/L (ref 19–32)
Chloride: 109 mEq/L (ref 96–112)
GFR calc Af Amer: 11 mL/min — ABNORMAL LOW (ref >90)
GFR calc non Af Amer: 10 mL/min — ABNORMAL LOW (ref >90)
Glucose, Bld: 131 mg/dL — ABNORMAL HIGH (ref 70–99)
Potassium: 4 mEq/L (ref 3.5–5.1)
Sodium: 143 mEq/L (ref 135–145)

## 2011-08-12 LAB — URINALYSIS, ROUTINE W REFLEX MICROSCOPIC
Bilirubin Urine: NEGATIVE
Ketones, ur: NEGATIVE mg/dL
Nitrite: NEGATIVE
Specific Gravity, Urine: 1.009 (ref 1.005–1.030)
Urobilinogen, UA: 0.2 mg/dL (ref 0.0–1.0)
pH: 5 (ref 5.0–8.0)

## 2011-08-12 LAB — CK
Total CK: 10575 U/L — ABNORMAL HIGH (ref 7–177)
Total CK: 9332 U/L — ABNORMAL HIGH (ref 7–177)

## 2011-08-12 LAB — URINE MICROSCOPIC-ADD ON

## 2011-08-12 LAB — CBC
Hemoglobin: 8.9 g/dL — ABNORMAL LOW (ref 12.0–15.0)
Platelets: 299 10*3/uL (ref 150–400)
RBC: 3.6 MIL/uL — ABNORMAL LOW (ref 3.87–5.11)
WBC: 9.7 10*3/uL (ref 4.0–10.5)

## 2011-08-12 LAB — PROTIME-INR
INR: 3.42 — ABNORMAL HIGH (ref 0.00–1.49)
Prothrombin Time: 35 seconds — ABNORMAL HIGH (ref 11.6–15.2)

## 2011-08-12 LAB — GLUCOSE, CAPILLARY
Glucose-Capillary: 191 mg/dL — ABNORMAL HIGH (ref 70–99)
Glucose-Capillary: 180 mg/dL — ABNORMAL HIGH (ref 70–99)

## 2011-08-12 LAB — MYOGLOBIN, SERUM: Myoglobin: 5977 ng/mL — ABNORMAL HIGH (ref <111)

## 2011-08-13 LAB — GLUCOSE, CAPILLARY: Glucose-Capillary: 146 mg/dL — ABNORMAL HIGH (ref 70–99)

## 2011-08-13 LAB — CBC
HCT: 31.9 % — ABNORMAL LOW (ref 36.0–46.0)
Platelets: 369 10*3/uL (ref 150–400)
RBC: 4.08 MIL/uL (ref 3.87–5.11)
RDW: 15.8 % — ABNORMAL HIGH (ref 11.5–15.5)
WBC: 10.2 10*3/uL (ref 4.0–10.5)

## 2011-08-13 LAB — CK: Total CK: 4451 U/L — ABNORMAL HIGH (ref 7–177)

## 2011-08-13 LAB — RENAL FUNCTION PANEL
Albumin: 2.8 g/dL — ABNORMAL LOW (ref 3.5–5.2)
BUN: 77 mg/dL — ABNORMAL HIGH (ref 6–23)
CO2: 20 mEq/L (ref 19–32)
Chloride: 109 mEq/L (ref 96–112)
GFR calc non Af Amer: 14 mL/min — ABNORMAL LOW (ref >90)
Potassium: 4.2 mEq/L (ref 3.5–5.1)

## 2011-08-13 LAB — PROTIME-INR: Prothrombin Time: 29.1 seconds — ABNORMAL HIGH (ref 11.6–15.2)

## 2011-08-14 DIAGNOSIS — J45901 Unspecified asthma with (acute) exacerbation: Secondary | ICD-10-CM

## 2011-08-14 DIAGNOSIS — N179 Acute kidney failure, unspecified: Secondary | ICD-10-CM

## 2011-08-14 DIAGNOSIS — J189 Pneumonia, unspecified organism: Secondary | ICD-10-CM

## 2011-08-14 LAB — GLUCOSE, CAPILLARY: Glucose-Capillary: 210 mg/dL — ABNORMAL HIGH (ref 70–99)

## 2011-08-14 LAB — RENAL FUNCTION PANEL
CO2: 18 mEq/L — ABNORMAL LOW (ref 19–32)
Calcium: 9.5 mg/dL (ref 8.4–10.5)
Chloride: 107 mEq/L (ref 96–112)
GFR calc Af Amer: 21 mL/min — ABNORMAL LOW (ref >90)
GFR calc non Af Amer: 18 mL/min — ABNORMAL LOW (ref >90)
Potassium: 5.4 mEq/L — ABNORMAL HIGH (ref 3.5–5.1)
Sodium: 141 mEq/L (ref 135–145)

## 2011-08-14 LAB — CBC
Hemoglobin: 9.9 g/dL — ABNORMAL LOW (ref 12.0–15.0)
MCH: 24 pg — ABNORMAL LOW (ref 26.0–34.0)
MCV: 78.4 fL (ref 78.0–100.0)
RBC: 4.12 MIL/uL (ref 3.87–5.11)

## 2011-08-14 LAB — CK
Total CK: 1592 U/L — ABNORMAL HIGH (ref 7–177)
Total CK: 903 U/L — ABNORMAL HIGH (ref 7–177)

## 2011-08-15 ENCOUNTER — Inpatient Hospital Stay (HOSPITAL_COMMUNITY): Payer: Medicare Other

## 2011-08-15 LAB — CK: Total CK: 681 U/L — ABNORMAL HIGH (ref 7–177)

## 2011-08-15 LAB — CBC
Hemoglobin: 10.6 g/dL — ABNORMAL LOW (ref 12.0–15.0)
MCV: 78.9 fL (ref 78.0–100.0)
Platelets: 407 10*3/uL — ABNORMAL HIGH (ref 150–400)
RBC: 4.21 MIL/uL (ref 3.87–5.11)
WBC: 9.5 10*3/uL (ref 4.0–10.5)

## 2011-08-15 LAB — GLUCOSE, CAPILLARY: Glucose-Capillary: 170 mg/dL — ABNORMAL HIGH (ref 70–99)

## 2011-08-15 LAB — BASIC METABOLIC PANEL
CO2: 23 mEq/L (ref 19–32)
Chloride: 109 mEq/L (ref 96–112)
Creatinine, Ser: 2.07 mg/dL — ABNORMAL HIGH (ref 0.50–1.10)

## 2011-08-16 ENCOUNTER — Telehealth: Payer: Self-pay | Admitting: Internal Medicine

## 2011-08-16 ENCOUNTER — Telehealth: Payer: Self-pay | Admitting: *Deleted

## 2011-08-16 LAB — CULTURE, BLOOD (ROUTINE X 2)
Culture  Setup Time: 201210030759
Culture: NO GROWTH

## 2011-08-16 MED ORDER — ALBUTEROL SULFATE HFA 108 (90 BASE) MCG/ACT IN AERS: 2.0000 | INHALATION_SPRAY | RESPIRATORY_TRACT | Status: DC | PRN

## 2011-08-16 NOTE — Telephone Encounter (Signed)
Ok to substitute any albuterol HFA, eg Proair. 2 puffs, every 4 hours if needed, dx asthma, refill prn

## 2011-08-16 NOTE — Telephone Encounter (Signed)
PA started for pt for ventolin hfa---[phone].  Fax has been received and placed on CY cart to be signed.  Will fax back once completed.

## 2011-08-16 NOTE — Telephone Encounter (Signed)
Per CY---we will change to another rescue inhaler instead of the ventolin.

## 2011-08-16 NOTE — Telephone Encounter (Signed)
Called and spoke with pt and she stated that her insurance denied the ventolin hfa for her.  She stated that she was sent home without any of this med and prior to this she was on the combivent.  She is aware that PA has been initiated today and can take up to 24-72 hours for approval.  Pt is ok to wait for this if it is ok for her to be off of this med for this long.,  CY please advise. thanks

## 2011-08-16 NOTE — Telephone Encounter (Signed)
Per CY-do not do PA for inhaler-patient can use any albuterol inhaler that her insurance will cover-see if they will cover Proair #1 2 puffs qid prn with prn refills

## 2011-08-16 NOTE — Telephone Encounter (Signed)
Called, spoke with pt.  She is ok with switching to another albuterol inhaler.  She was informed by pharmacy that proair will be covered by her insurance.  I advised I will send in rx for the proair to [facility] and to use 2 puffs q4h prn.  She verbalized understanding of this and will call back if anything further is needed.

## 2011-08-18 ENCOUNTER — Telehealth: Payer: Self-pay

## 2011-08-18 ENCOUNTER — Telehealth: Payer: Self-pay | Admitting: Cardiology

## 2011-08-18 DIAGNOSIS — I119 Hypertensive heart disease without heart failure: Secondary | ICD-10-CM

## 2011-08-18 DIAGNOSIS — R899 Unspecified abnormal finding in specimens from other organs, systems and tissues: Secondary | ICD-10-CM

## 2011-08-18 DIAGNOSIS — I4891 Unspecified atrial fibrillation: Secondary | ICD-10-CM

## 2011-08-18 NOTE — Telephone Encounter (Signed)
Patient called concerned about nose bleed (one nostril, lasting <3 minutes). Patient was recently hospitalized for pneumonia and finished cefuroxime 1-2 days ago. Was discharged on her regular home regimin of coumadin. On discharge (10/8) her INR was 2.63. Denies any other signs of bleeding. Her next appointment with [place] is 10/22. I instructed her to continue her normal dose of coumadin/continue to be consistent with her greens and to watch for any more bleeding and to contact us to check her INR sooner if she has any more nose bleeds, if they become worse, or if she has any other signs of bleeding.

## 2011-08-18 NOTE — Telephone Encounter (Signed)
Pt calling stating that she was recently d/c from hospital and is now having a nose bleed (pt is on coumadin). Please return pt call to discuss further. Pt was transferred to coumadin clinic.

## 2011-08-18 NOTE — Telephone Encounter (Signed)
Called patient to schedule follow up appointment not realizing she had one scheduled.  Spoke with patient further regarding nose bleed and she stated started back Monday night on usual dose of coumadin, not sure if given before discharge or not.  Discussed with  [name] and will have her hold coumadin for 2 days.  Did move her office visit to next week with Lawson Fiscal NP and scheduled labs

## 2011-08-18 NOTE — Telephone Encounter (Signed)
Patient called concerned about nose bleed (one nostril, lasting <3 minutes). Patient was recently hospitalized for pneumonia and finished cefuroxime 1-2 days ago. Was discharged on her regular home regimin of coumadin. On discharge (10/8) her INR was 2.63. Denies any other signs of bleeding. Her next appointment with us is 10/22. I instructed her to continue her normal dose of coumadin/continue to be consistent with her greens and to watch for any more bleeding and to contact us to check her INR sooner if she has any more nose bleeds, if they become worse, or if she has any other signs of bleeding.  

## 2011-08-18 NOTE — Telephone Encounter (Signed)
Agree with plan 

## 2011-08-24 ENCOUNTER — Encounter: Payer: Self-pay | Admitting: Nurse Practitioner

## 2011-08-24 ENCOUNTER — Other Ambulatory Visit: Payer: Medicare Other | Admitting: *Deleted

## 2011-08-24 ENCOUNTER — Ambulatory Visit (INDEPENDENT_AMBULATORY_CARE_PROVIDER_SITE_OTHER): Payer: Medicare Other | Admitting: Nurse Practitioner

## 2011-08-24 ENCOUNTER — Ambulatory Visit (INDEPENDENT_AMBULATORY_CARE_PROVIDER_SITE_OTHER): Payer: Medicare Other | Admitting: *Deleted

## 2011-08-24 VITALS — BP 148/88 | HR 88 | Ht 62.0 in | Wt 172.1 lb

## 2011-08-24 DIAGNOSIS — J189 Pneumonia, unspecified organism: Secondary | ICD-10-CM

## 2011-08-24 DIAGNOSIS — E785 Hyperlipidemia, unspecified: Secondary | ICD-10-CM

## 2011-08-24 DIAGNOSIS — I119 Hypertensive heart disease without heart failure: Secondary | ICD-10-CM

## 2011-08-24 DIAGNOSIS — E7800 Pure hypercholesterolemia, unspecified: Secondary | ICD-10-CM | POA: Insufficient documentation

## 2011-08-24 DIAGNOSIS — I4891 Unspecified atrial fibrillation: Secondary | ICD-10-CM

## 2011-08-24 NOTE — Assessment & Plan Note (Signed)
She is currently off of her statin due to the rhabdo. Labs have been checked by [name]. She will make sure a copy comes our way.

## 2011-08-24 NOTE — Progress Notes (Signed)
[name] Date of Birth: 10-Sep-1941 Medical Record #[id number]  History of Present Illness: Renee Ray is seen today for her post hospital visit. She is seen for [name]. She was admitted earlier this month with pneumonia, UTI and rhabdo that caused renal failure. She had laid on the floor at her home for over a day. She responded nicely with antibiotics and is doing much better. She saw [name] yesterday who checked her labs. She has no fever, chills, cough or shortness of breath. She is anxious to return to work. No cardiac complaints. She has chronic atrial fib and is on coumadin. Her INR's have been a little out of line due to antibiotics. She had one nose bleed but no recurrence. Overall, she is doing much better. She has gotten a life alert at home.   Current Outpatient Prescriptions on File Prior to Visit  Medication Sig Dispense Refill  . albuterol (PROAIR HFA) 108 (90 BASE) MCG/ACT inhaler Inhale 2 puffs into the lungs every 4 (four) hours as needed.  1 Inhaler  prn  . aspirin 81 MG tablet Take 81 mg by mouth daily.        [name] b complex vitamins capsule Take 1 capsule by mouth daily.        . calcium carbonate (OS-CAL) 600 MG TABS Take 600 mg by mouth daily.        . cholecalciferol (VITAMIN D) 1000 UNITS tablet Take 1,000 Units by mouth daily.        . Flaxseed, Linseed, (FLAXSEED OIL PO) Take by mouth daily.        . insulin glargine (LANTUS) 100 UNIT/ML injection Inject into the skin at bedtime. Taking 14units daily      . Multiple Vitamin (MULTIVITAMIN) tablet Take 1 tablet by mouth daily.        . sitaGLIPtin (JANUVIA) 100 MG tablet Take 100 mg by mouth daily.        [name] tiotropium (SPIRIVA) 18 MCG inhalation capsule Place 18 mcg into inhaler and inhale as needed.        . valsartan-hydrochlorothiazide (DIOVAN-HCT) 320-25 MG per tablet Take 1 tablet by mouth daily.        [name] warfarin (COUMADIN) 5 MG tablet Take 5 mg by mouth as directed.          Allergies  Allergen  Reactions  . Amoxicillin   . Sulfonamide Derivatives     REACTION: thrush    Past Medical History  Diagnosis Date  . Hypertension   . Diabetes mellitus   . Asthma   . Hyperlipidemia   . Chronic atrial fibrillation   . Chronic anticoagulation   . Pneumonia Oct 2012    associated with rhabdo    Past Surgical History  Procedure Date  . Partial hysterectomy   . Breast enhancement surgery     History  Smoking status  . Former Smoker  . Quit date: 05/05/1996  Smokeless tobacco  . Not on file    History  Alcohol Use No    Family History  Problem Relation Age of Onset  . Arrhythmia Mother   . Heart failure Mother   . Heart attack Father   . Diabetes Brother     Review of Systems: The review of systems is per the HPI. Blood pressure yesterday at [name] office was great. He has done her follow up labs.  All other systems were reviewed and are negative.  Physical Exam: BP 148/88  Pulse 88  Ht 5\' 2"  (1.575 m)  Wt 172 lb 1.9 oz (78.073 kg)  BMI 31.48 kg/m2 Patient is very pleasant and in no acute distress. She looks younger than her stated age. Skin is warm and dry. Color is normal.  HEENT is unremarkable. Normocephalic/atraumatic. PERRL. Sclera are nonicteric. Neck is supple. No masses. No JVD. Lungs are clear. Cardiac exam shows a regular rate and rhythm. Abdomen is soft. Extremities are without edema. Gait and ROM are intact. No gross neurologic deficits noted.   LABORATORY DATA:   Assessment / Plan:

## 2011-08-24 NOTE — Assessment & Plan Note (Signed)
Blood pressure has been good at home. She will continue to monitor. We will tentatively see her back in 4 months. Patient is agreeable to this plan and will call if any problems develop in the interim.

## 2011-08-24 NOTE — Assessment & Plan Note (Signed)
This is chronic. Her rate is controlled. She is maintained on coumadin.

## 2011-08-24 NOTE — Assessment & Plan Note (Signed)
She is to see Dr. Maple Hudson next week. She is doing well. Labs have already been done in follow up of her rhabdo. I have given her an ok to return to work.

## 2011-08-24 NOTE — Patient Instructions (Signed)
Stay on your current medicines.  You may return to work.  We will get your coumadin checked today  We will see you back in about 4 months  Monitor your blood pressure at home. Let us know if consistently above 135/85  Call for any problems.

## 2011-08-26 NOTE — Discharge Summary (Signed)
Renee Ray, Renee Ray              ACCOUNT NO.:  [id number]  MEDICAL RECORD NO.:  [id number]  LOCATION:  3733                         FACILITY:  [facility]  PHYSICIAN:  Jaleyah Longhi D. Maple Hudson, MD, FCCP, FACPDATE OF BIRTH:  01-16-1941  DATE OF ADMISSION:  08/09/2011 DATE OF DISCHARGE:  08/15/2011                              DISCHARGE SUMMARY   DISCHARGE DIAGNOSES: 1. Systemic inflammatory response syndrome/sepsis.     a.     Escherichia coli urinary tract infection.     b.     Community-acquired pneumonia. 2. Atrial fibrillation. 3. Asthma/chronic obstructive pulmonary disease. 4. Diabetes. 5. Rhabdomyolysis. 6. Acute on chronic renal insufficiency.  HISTORY OF PRESENT ILLNESS:  Renee Ray is a 70 year old female with history of well-controlled asthma, diabetes, and chronic AFib on Coumadin who presented on October 2 after she passed out in the restroom approximately 3 days prior to admission and was not able to get up untilthe day of admission when EMS was called.  She initially refused to go to the hospital at the time that EMS presented, however, the following day she presented to the [facility] Emergency Room stating that she was not feeling any better with shortness of breath and general malaise.  On presentation, labs revealed acute renal failure and rhabdomyolysis as well as likely community-acquired pneumonia and the patient did initially require bypass.  Pulmonary Critical Care was called to admit.  LABORATORY DATA:  At time of admission on October 2, INR 1.72.  CBC: White blood cells 13.1 hemoglobin 11.1 hematocrit 33.8, and platelets 292.  BNP was 4619.  Complete metabolic panel:  Sodium 138, potassium 4.0, glucose 145, BUN 59, creatinine 4.7.  Urinalysis showed large blood and moderate leukocytes and many bacteria.  Procalcitonin was 91.11. Followup procalcitonin on October 4, 36.84.  October 3, CK 24,864. Other pertinent laboratory data, October 4, CK 14,006, October 5  CK 6019, October 6 INR 2.7.  MICRO DATA:  Blood cultures x2 on October 2, preliminary report is negative.  Urine culture on October 2, final report of E. coli.  Urine Legionella was negative.  RADIOLOGY DATA: 1. At the time of admission, October 2, portable chest x-ray shows     left upper lobe pneumonia, superimposed bronchitis.  Renal     ultrasound October 4 shows right renal cortical thinning and     echogenic liver suggesting hepatic dysfunction. 2. Two-view chest x-ray on October 8 shows no definite pneumonia, left     basilar linear atelectasis, hyperaeration, and stable cardiomegaly.  HOSPITAL COURSE BY DISCHARGE DIAGNOSES: 1. Systemic inflammatory response syndrome/sepsis in setting of     Escherichia coli urinary tract infection and likely community-     acquired pneumonia.  The patient was initially treated with IV     Avelox and has been transitioned to p.o. Ceftin which she will     continue for a total course of 10 days of antibiotics.  The patient     did initially require BiPAP.  By the time of discharge, respiratory     status is back to her baseline.  White count is back to normal and     she is afebrile. 2. Chronic atrial fibrillation.  The patient was continued on her     Coumadin.  Her rate is currently controlled.  She was initially     borderline hypotensive and so many of her home antihypertensives     have not been resumed.  She will be discharged back on her Diovan,     however, bisoprolol and digoxin will continue to be held and she     will follow up with [name] for further recommendations on     restarting home medications.  She will also be discharged on her     maintenance Coumadin dose and will visit the Coumadin Clinic at the     time of her followup with [name]. 3. Asthma/chronic obstructive pulmonary disease.  Again complicated by     likely community-acquired pneumonia.  These are currently     controlled on her maintenance nebs and  she will be discharged on     her previous home regimen. 4. Diabetes exacerbated by short taper of prednisone, which she has     completed.  She is currently on Lantus 12 units daily, although at     home she is on Lantus 24 units daily.  Her glucose seems to be     trending down nicely.  We will discharge on 12 units Lantus daily     and follow up with [name] for further recommendations. 5. Rhabdomyolysis in the setting of community-acquired pneumonia and     extended downtime after a syncopal episode.  CKs were initially     extremely high and continued to improve. 6. Acute on chronic renal insufficiency secondary to rhabomyolysis.     Nephrology followed the patient closely.  Her creatinine is     trending down nicely.  Nephrotoxic medications were held and the     patient was rehydrated.  CK continues to trend down and urine     output is good.  Again no further acute issues and it was felt that     she will likely return to her baseline renal function.  Outpatient     basic metabolic panel should be obtained at her next followup     appointment.  DISCHARGE MEDICATIONS: 1. Albuterol inhaler 2 puffs inhaled q.3 hours p.r.n. 2. Albuterol inhaler 2 puffs inhaled t.i.d. 3. Ceftin 250 mg tabs p.o. b.i.d. until October 9. 4. Lantus 12 units subcu daily. 5. Aspirin 81 mg daily. 6. Calcium 600 mg p.o. daily. 7. Coumadin.  The patient takes 5 mg on Tuesday, Thursday, Saturday,     and Sunday and 10 mg on Monday, Wednesday, and Friday. 8. Diovan HCT 320/25, 1 tab p.o. daily. 9. Flax seed 1000 mg p.o. daily. 10.Januvia 100 mg p.o. daily. 11.Multivitamins 1 tab p.o. daily. 12.Spiriva 18 mcg inhaled daily. 13.Vitamin D 1000 units p.o. daily.  DISCHARGE ACTIVITY:  No restrictions.  DISCHARGE DIET:  Low-sodium, heart healthy diet.  DISCHARGE PLAN: 1. Dr. Maple Hudson on October 25 at 9:30 a.m. 2. [name] on October 16 at 4 p.m. 3. Norma Fredrickson, nurse practitioner with Dr. Yevonne Pax  office on     October 22 at 9:45.  The patient will also visit Coumadin Clinic at     that time.  DISPOSITION:  The patient has met maximum benefit from her inpatient hospitalization.  She is medically cleared and ready for discharge.  She will be discharged to home.  Greater than 30 minutes was spent on this discharge.     Dirk Dress, NP  ______________________________ Rennis Chris Maple Hudson, MD, FCCP, FACP    KW/MEDQ  D:  08/15/2011  T:  08/15/2011  Job:  [id number]  cc:   Joni Fears D. Maple Hudson, MD, FCCP, FACP Tonita Cong, M.D. Cassell Clement, M.D. Dante Gang, N.P. [facility]  Electronically Signed by Danford Bad N.P. on 08/24/2011 08:29:43 PM Electronically Signed by Jetty Duhamel MD FCCP FACP on 08/26/2011 07:45:07 PM

## 2011-08-29 ENCOUNTER — Encounter: Payer: Medicare Other | Admitting: Nurse Practitioner

## 2011-08-29 ENCOUNTER — Encounter: Payer: Medicare Other | Admitting: *Deleted

## 2011-08-30 NOTE — Consult Note (Signed)
Renee Ray, TOOTLE              ACCOUNT NO.:  [id number]  MEDICAL RECORD NO.:  [id number]  LOCATION:  3733                         FACILITY:  [facility]  PHYSICIAN:  Aram Beecham B. Eliott Nine, M.D.DATE OF BIRTH:  1940/12/15  DATE OF CONSULTATION:  08/12/2011 DATE OF DISCHARGE:                                CONSULTATION   PRIMARY TEAM:  PCCM.  HISTORY OF PRESENT ILLNESS:  This is a 70 year old female, admitted on August 09, 2011, for asthma exacerbation, pneumonia, rhabdomyolysis, and dehydration, all contributing to acute kidney injury.  Renal Service was consulted by PCCM for this acute kidney injury.  The patient has no known history of kidney disease, however, in speaking with the patient, she states that her last visit with [name], her endocrinologist, he told her that her kidney function was 60% of what it should be and thought that this may be due to her diabetes.  Upon further investigation, her most recent creatinine in the office was 0.95 in September 2012.  On admission, the patient's creatinine was found to be 4.77 which then increased to 4.83 on August 10, 2011, hitting a max of 4.94 on August 11, 2011.  On the day of consult, the patient's creatinine had improved to 4.22 after only rehydration therapy.  Also on admission, the patient's CK total was elevated to 29453 and has now trended down to 9332.  On August 11, 2011, the patient's myoglobin was 5977, at which point her total CK was 16109.  The patient has been given IV fluid rehydration.  Other initial insults to the patient's kidneys could have included a continued Lasix use in the face of acute illness and decreased p.o. intake prior to admission.  In addition, the patient is on Diovan and HCTZ as home medications which also may have contributed to this acute rise in creatinine.  As previously mentioned, the patient's creatinine was 0.95 in the office on July 21, 2011. In addition, she had a negative urine  microalbumin at that time, showing that she does not have any diabetic nephropathy.  PAST MEDICAL HISTORY: 1. Type 2 diabetes, insulin dependent. 2. Atrial fibrillation, on Coumadin, rate controlled. 3. Asthmatic bronchitis. 4. Cardiomyopathy.  CURRENT MEDICATIONS: 1. Albuterol t.i.d. plus p.r.n. 2. Ceftin 250 mg p.o. b.i.d. 3. Sliding scale insulin Lantus 12 units daily. 4. Saline locked IV. 5. Spiriva daily. 6. Coumadin per pharmacy protocol. 7. SCDs.  PHYSICAL EXAMINATION:  VITAL SIGNS:  Temperature 97.9, blood pressure 126/75, pulse 74, respiratory rate 18, O2 sat 96% on room air.  In's and out's 240/0. GENERAL:  No acute distress, sitting in chair.  Reports increased urine output over the past day. HEENT:  Moist mucous membranes.  Extraocular movements intact.  No jaundice. CARDIOVASCULAR:  Irregularly irregular, rate controlled. PULMONARY:  Crackles in left upper lobe, scattered expiratory wheezes. Good respiratory effort. ABDOMEN:  Soft, nontender. EXTREMITIES:  No tenderness.  Trace amount of pitting edema, right greater than left lower extremity.  LABS:  CBC:  9.7/8.9/28.0/299.  BMET:  143/4.0/109/20/81/4.22/131.  T bili 0.3, alk phos 93, AST/ALT 326/286, total protein 6.5, albumin 2.8, calcium 8.9, phosphorus 6.6, mag 2.6.  INR 3.42.  CK total 9332.  Abdominal ultrasound showing  right renal cortical thinning, suggesting medical renal disease, right kidney 11.4 cm, left kidney 12.2 cm.  Chest x-ray on August 11, 2011, showing a large cardiac silhouette, improved pulmonary infiltrates.  ASSESSMENT AND PLAN:  This is a 70 year old female with type 2 diabetes and atrial fibrillation, presenting with asthma exacerbation and pneumonia in addition to rhabdomyolysis and dehydration after being found down, all leading to acute kidney injury.  1. Acute kidney injury.  Mechanism is likely rhabdomyolysis plus     hypovolemia plus home medications.  Creatinine is  decreasing with     supportive management and IV fluids.  [name] with [facility]     Endocrinology was contacted for lab records and it was found that     this patient's kidney injury appears to only be acute.  There was     no prior kidney damage and a negative microalbumin less than 1     month ago. 2. Pulmonary.  The patient with both pneumonia, on antibiotics; and     asthma, on albuterol and Spiriva.  This is being managed by Primary     Team. 3. Rhabdomyolysis, likely contributing to problem #1.  We will     continue p.o. hydration at this time, however, if creatinine bumps,     we will reinitiate IV fluids.  Total CK is also decreasing 4. Atrial fibrillation.  This is a chronic problem, rate controlled on     Coumadin, will be managed by Primary Team.    ______________________________ Demetria Pore, MD   ______________________________ Duke Salvia. Eliott Nine, M.D.    JM/MEDQ  D:  08/12/2011  T:  08/12/2011  Job:  [id number]  Electronically Signed by Demetria Pore MD on 08/13/2011 04:42:24 PM Electronically Signed by Camille Bal M.D. on 08/30/2011 09:35:10 PM

## 2011-09-01 ENCOUNTER — Encounter: Payer: Self-pay | Admitting: Internal Medicine

## 2011-09-01 ENCOUNTER — Ambulatory Visit (INDEPENDENT_AMBULATORY_CARE_PROVIDER_SITE_OTHER): Payer: Medicare Other | Admitting: Internal Medicine

## 2011-09-01 VITALS — BP 124/68 | HR 91 | Ht 64.0 in | Wt 176.8 lb

## 2011-09-01 DIAGNOSIS — J45909 Unspecified asthma, uncomplicated: Secondary | ICD-10-CM

## 2011-09-01 NOTE — Progress Notes (Signed)
09/01/11- 70 year old female former smoker followed for asthmatic bronchitis complicated by DM, CM, atrial fibrillation. Widow of Dr. Molly Maduro Simons/neurosurgeon. She continues to work part-time at [employer]. Has had flu vaccine. Has had pneumonia vaccine. Post hospital visit now after episode of syncope with rhabdomyolysis. She feels back to normal and is able to work and to get around with her usual activities. She now has a Medic alert device. Had renal insufficiency which is being tracked by Dr.Kerr, who follows up for diabetes. He has seen her and reports stable renal and liver function. She denies cough, wheeze, chest pain, palpitation or swelling. CXR 08/25/2011-mild CE, hyperinflation, bronchitis with some atelectasis left base.  ROS-see HPI Constitutional:   No-   weight loss, night sweats, fevers, chills, fatigue, lassitude. HEENT:   No-  headaches, difficulty swallowing, tooth/dental problems, sore throat,       No-  sneezing, itching, ear ache, nasal congestion, post nasal drip,  CV:  No-   chest pain, orthopnea, PND, swelling in lower extremities, anasarca, dizziness, palpitations Resp: No-acute  shortness of breath with exertion or at rest.              No-   productive cough,  No non-productive cough,  No- coughing up of blood.              No-   change in color of mucus.  No- wheezing.   Skin: No-   rash or lesions. GI:  No-   heartburn, indigestion, abdominal pain, nausea, vomiting, diarrhea,                 change in bowel habits, loss of appetite GU: No-   dysuria, change in color of urine, no urgency or frequency.  No- flank pain. MS:  No-   joint pain or swelling.  No- decreased range of motion.  No- back pain. Neuro-     nothing unusual Psych:  No- change in mood or affect. No depression or anxiety.  No memory loss.  OBJ General- Alert, Oriented, Affect-appropriate, Distress- none acute. Looks well Skin- rash-none, lesions- none, excoriation- none Lymphadenopathy-  none Head- atraumatic            Eyes- Gross vision intact, PERRLA, conjunctivae clear secretions            Ears- Hearing, canals-normal            Nose- Clear, no-Septal dev, mucus, polyps, erosion, perforation             Throat- Mallampati II , mucosa clear , drainage- none, tonsils- atrophic Neck- flexible , trachea midline, no stridor , thyroid nl, carotid no bruit Chest - symmetrical excursion , unlabored           Heart/CV- RRR or very nearly regular to palpation , no murmur , no gallop  , no rub, nl s1 s2                           - JVD- none , edema- none, stasis changes- none, varices- none           Lung- trace wheeze right scapula, cough- none , dullness-none, rub- none           Chest wall-  Abd- tender-no, distended-no, bowel sounds-present, HSM- no Br/ Gen/ Rectal- Not done, not indicated Extrem- cyanosis- none, clubbing, none, atrophy- none, strength- nl Neuro- grossly intact to observation

## 2011-09-01 NOTE — Patient Instructions (Signed)
Order- schedule PFT dx asthma  Please call as needed

## 2011-09-04 NOTE — Assessment & Plan Note (Signed)
Asthma control is good with underlying COPD. We need PFT to clarify how much fixed lung disease she actually has.

## 2011-09-04 NOTE — Progress Notes (Signed)
Agree with plan 

## 2011-09-18 LAB — PROTIME-INR: INR: 2.3 — AB (ref 0.9–1.1)

## 2011-09-20 ENCOUNTER — Ambulatory Visit (INDEPENDENT_AMBULATORY_CARE_PROVIDER_SITE_OTHER): Payer: Self-pay | Admitting: Cardiovascular Disease

## 2011-09-20 DIAGNOSIS — R0989 Other specified symptoms and signs involving the circulatory and respiratory systems: Secondary | ICD-10-CM

## 2011-09-20 DIAGNOSIS — I4891 Unspecified atrial fibrillation: Secondary | ICD-10-CM

## 2011-09-21 ENCOUNTER — Encounter: Payer: Medicare Other | Admitting: *Deleted

## 2011-10-05 ENCOUNTER — Ambulatory Visit (INDEPENDENT_AMBULATORY_CARE_PROVIDER_SITE_OTHER): Payer: Medicare Other | Admitting: Internal Medicine

## 2011-10-05 DIAGNOSIS — J45909 Unspecified asthma, uncomplicated: Secondary | ICD-10-CM

## 2011-10-05 LAB — PULMONARY FUNCTION TEST

## 2011-10-05 NOTE — Progress Notes (Signed)
PFT done today. 

## 2011-10-17 ENCOUNTER — Ambulatory Visit (INDEPENDENT_AMBULATORY_CARE_PROVIDER_SITE_OTHER): Payer: Medicare Other | Admitting: *Deleted

## 2011-10-17 ENCOUNTER — Encounter: Payer: Medicare Other | Admitting: *Deleted

## 2011-10-17 DIAGNOSIS — I4891 Unspecified atrial fibrillation: Secondary | ICD-10-CM

## 2011-10-27 ENCOUNTER — Ambulatory Visit (INDEPENDENT_AMBULATORY_CARE_PROVIDER_SITE_OTHER): Payer: Medicare Other | Admitting: Internal Medicine

## 2011-10-27 ENCOUNTER — Encounter: Payer: Self-pay | Admitting: Internal Medicine

## 2011-10-27 ENCOUNTER — Ambulatory Visit (INDEPENDENT_AMBULATORY_CARE_PROVIDER_SITE_OTHER)
Admission: RE | Admit: 2011-10-27 | Discharge: 2011-10-27 | Disposition: A | Discharge status: Home / Self Care | Payer: Medicare Other | Source: Ambulatory Visit | Attending: Internal Medicine | Admitting: Internal Medicine

## 2011-10-27 VITALS — BP 120/68 | HR 100 | Temp 98.5°F | Ht 64.0 in | Wt 180.4 lb

## 2011-10-27 DIAGNOSIS — J449 Chronic obstructive pulmonary disease, unspecified: Secondary | ICD-10-CM

## 2011-10-27 MED ORDER — TIOTROPIUM BROMIDE MONOHYDRATE 18 MCG IN CAPS: 18.0000 ug | ORAL_CAPSULE | Freq: Every day | RESPIRATORY_TRACT | Status: DC

## 2011-10-27 MED ORDER — LEVALBUTEROL HCL 0.63 MG/3ML IN NEBU
0.6300 mg | INHALATION_SOLUTION | Freq: Once | RESPIRATORY_TRACT | Status: AC
  Administered 2011-10-27: 0.63 mg via RESPIRATORY_TRACT

## 2011-10-27 MED ORDER — METHYLPREDNISOLONE ACETATE 80 MG/ML IJ SUSP
80.0000 mg | Freq: Once | INTRAMUSCULAR | Status: AC
  Administered 2011-10-27: 80 mg via INTRAMUSCULAR

## 2011-10-27 MED ORDER — AZITHROMYCIN 250 MG PO TABS: ORAL_TABLET | ORAL | Status: AC

## 2011-10-27 NOTE — Progress Notes (Signed)
09/01/11- 70 year old female former smoker followed for asthmatic bronchitis complicated by DM, CM, atrial fibrillation. Widow of Dr. Molly Maduro Zackery/neurosurgeon. She continues to work part-time at [employer]. Has had flu vaccine. Has had pneumonia vaccine. Post hospital visit now after episode of syncope with rhabdomyolysis. She feels back to normal and is able to work and to get around with her usual activities. She now has a Medic alert device. Had renal insufficiency which is being tracked by Dr.Kerr, who follows up for diabetes. He has seen her and reports stable renal and liver function. She denies cough, wheeze, chest pain, palpitation or swelling. CXR 08/25/2011-mild CE, hyperinflation, bronchitis with some atelectasis left base.  10/27/11-70 year old female former smoker followed for asthmatic bronchitis complicated by DM, CM, atrial fibrillation. Widow of Dr. Molly Maduro Samples/neurosurgeon. She continues to work part-time at [employer]. Has had flu vaccine. Has had pneumonia vaccine. Malaise yesterday. Today woke w/ temp 101.4, cough productive green. Nose blowing clear. Scratchy throat. No muscle ache.  PFT-10/05/11- moderate COPD- FEV1/FVC0.61, DLCO 74%. She wants to re-do pulmonary rehab.  ROS-see HPI Constitutional:   No-   weight loss, night sweats, +fevers, chills, fatigue, lassitude. HEENT:   No-  headaches, difficulty swallowing, tooth/dental problems, sore throat,       No-  sneezing, itching, ear ache, +nasal congestion, post nasal drip,  CV:  No-   chest pain, orthopnea, PND, swelling in lower extremities, anasarca, dizziness, palpitations Resp: Someacute  shortness of breath with exertion or at rest.              No-   productive cough,  No non-productive cough,  No- coughing up of blood.              No-   change in color of mucus.  No- wheezing.   Skin: No-   rash or lesions. GI:  No-   heartburn, indigestion, abdominal pain, nausea, vomiting, diarrhea,        change in bowel habits, loss of appetite GU: MS:  No-   joint pain or swelling.  No- decreased range of motion.  No- back pain. Neuro-     nothing unusual Psych:  No- change in mood or affect. No depression or anxiety.  No memory loss.  OBJ General- Alert, Oriented, Affect-appropriate, Distress- none acute. Looks well Skin- rash-none, lesions- none, excoriation- none; warm  Lymphadenopathy- none Head- atraumatic            Eyes- Gross vision intact, PERRLA, conjunctivae clear secretions            Ears- Hearing, canals-normal            Nose- Clear, no-Septal dev, mucus, polyps, erosion, perforation             Throat- Mallampati II , mucosa red , drainage- none, tonsils- atrophic Neck- flexible , trachea midline, no stridor , thyroid nl, carotid no bruit Chest - symmetrical excursion , unlabored           Heart/CV- RRR or very nearly regular to palpation , no murmur , no gallop  , no rub, nl s1 s2                           - JVD- none , edema- none, stasis changes- none, varices- none           Lung- inspiratory wheeze, cough- none , dullness-none, rub- none  Chest wall-  Abd- tender-no, distended-no, bowel sounds-present, HSM- no Br/ Gen/ Rectal- Not done, not indicated Extrem- cyanosis- none, clubbing, none, atrophy- none, strength- nl Neuro- grossly intact to observation

## 2011-10-27 NOTE — Patient Instructions (Addendum)
Order- [facility] for Gold Stage 111 COPD  Order CXR  Dx COPD  Refill Spiriva  Script for Z pak to hold  Neb xop 0.63  Depo 80

## 2011-10-31 NOTE — Assessment & Plan Note (Signed)
GOLD III COPD. We will give neb and depo today for mild acute exacerbation, CXR, use her Spiriva daily not as a rescue inhaler. Pulmonary Rehab referral.

## 2011-11-02 ENCOUNTER — Telehealth: Payer: Self-pay | Admitting: Internal Medicine

## 2011-11-02 NOTE — Telephone Encounter (Signed)
Pt is aware of CXR results per [facility].

## 2011-11-08 ENCOUNTER — Other Ambulatory Visit: Payer: Self-pay | Admitting: Internal Medicine

## 2011-11-15 ENCOUNTER — Other Ambulatory Visit: Payer: Self-pay

## 2011-11-15 MED ORDER — WARFARIN SODIUM 5 MG PO TABS: ORAL_TABLET | ORAL | Status: DC

## 2011-11-18 ENCOUNTER — Ambulatory Visit (INDEPENDENT_AMBULATORY_CARE_PROVIDER_SITE_OTHER): Payer: Medicare Other | Admitting: *Deleted

## 2011-11-18 ENCOUNTER — Telehealth: Payer: Self-pay | Admitting: Cardiology

## 2011-11-18 ENCOUNTER — Other Ambulatory Visit: Payer: Self-pay | Admitting: *Deleted

## 2011-11-18 DIAGNOSIS — I4891 Unspecified atrial fibrillation: Secondary | ICD-10-CM

## 2011-11-18 LAB — POCT INR: INR: 2.1

## 2011-11-18 MED ORDER — WARFARIN SODIUM 5 MG PO TABS: ORAL_TABLET | ORAL | Status: DC

## 2011-11-18 NOTE — Telephone Encounter (Signed)
Pt calling re nose bleed, on coumadin, pls advise

## 2011-11-18 NOTE — Telephone Encounter (Signed)
INR 10/17/11  2.2.    Scheduled for INR today

## 2011-11-25 ENCOUNTER — Encounter: Payer: Medicare Other | Admitting: *Deleted

## 2011-11-28 ENCOUNTER — Encounter: Payer: Medicare Other | Admitting: *Deleted

## 2011-12-16 ENCOUNTER — Ambulatory Visit: Payer: Medicare Other

## 2011-12-16 ENCOUNTER — Encounter: Payer: Self-pay | Admitting: Nurse Practitioner

## 2011-12-16 ENCOUNTER — Ambulatory Visit (INDEPENDENT_AMBULATORY_CARE_PROVIDER_SITE_OTHER): Payer: Medicare Other | Admitting: Nurse Practitioner

## 2011-12-16 ENCOUNTER — Encounter: Payer: Medicare Other | Admitting: *Deleted

## 2011-12-16 VITALS — BP 160/90 | HR 84 | Ht 62.0 in | Wt 185.0 lb

## 2011-12-16 DIAGNOSIS — I4891 Unspecified atrial fibrillation: Secondary | ICD-10-CM

## 2011-12-16 DIAGNOSIS — E785 Hyperlipidemia, unspecified: Secondary | ICD-10-CM

## 2011-12-16 DIAGNOSIS — I119 Hypertensive heart disease without heart failure: Secondary | ICD-10-CM

## 2011-12-16 DIAGNOSIS — I428 Other cardiomyopathies: Secondary | ICD-10-CM

## 2011-12-16 LAB — BASIC METABOLIC PANEL
BUN: 24 mg/dL — ABNORMAL HIGH (ref 6–23)
CO2: 28 mEq/L (ref 19–32)
Calcium: 9.1 mg/dL (ref 8.4–10.5)
Chloride: 107 mEq/L (ref 96–112)
Creatinine, Ser: 0.7 mg/dL (ref 0.4–1.2)
GFR: 83.44 mL/min (ref >60.00)
Glucose, Bld: 132 mg/dL — ABNORMAL HIGH (ref 70–99)
Potassium: 4.1 mEq/L (ref 3.5–5.1)
Sodium: 141 mEq/L (ref 135–145)

## 2011-12-16 LAB — LIPID PANEL
Cholesterol: 254 mg/dL — ABNORMAL HIGH (ref 0–200)
HDL: 73.6 mg/dL (ref >39.00)
Total CHOL/HDL Ratio: 3
Triglycerides: 93 mg/dL (ref 0.0–149.0)
VLDL: 18.6 mg/dL (ref 0.0–40.0)

## 2011-12-16 LAB — HEPATIC FUNCTION PANEL
ALT: 32 U/L (ref 0–35)
AST: 28 U/L (ref 0–37)
Albumin: 3.6 g/dL (ref 3.5–5.2)
Alkaline Phosphatase: 80 U/L (ref 39–117)
Bilirubin, Direct: 0 mg/dL (ref 0.0–0.3)
Total Bilirubin: 0.3 mg/dL (ref 0.3–1.2)
Total Protein: 7 g/dL (ref 6.0–8.3)

## 2011-12-16 LAB — LDL CHOLESTEROL, DIRECT: Direct LDL: 165.6 mg/dL

## 2011-12-16 LAB — POCT INR: INR: 2.8

## 2011-12-16 NOTE — Assessment & Plan Note (Signed)
Last echo was in 2010 and showed a normal EF. She is currently without any symptoms.

## 2011-12-16 NOTE — Assessment & Plan Note (Signed)
She says she has some "white coat syndrome". I have asked her to monitor at home. Goal for her is to be closer to 120/80.

## 2011-12-16 NOTE — Assessment & Plan Note (Addendum)
Her rate is controlled. She remains on her coumadin. We are checking her INR today. She is planning on arthroscopy of her knee in the spring. Should be an acceptable candidate.

## 2011-12-16 NOTE — Patient Instructions (Addendum)
We will check your labs today. Will need to think about restarting your statin.  Monitor your blood pressure at home. Let us know if your readings are staying above 135/85. Your goal is to be closer to 120/80.  We will see you back in 4 months with [name].  Call the [facility] office at [phone] if you have any questions, problems or concerns.

## 2011-12-16 NOTE — Progress Notes (Signed)
[name] Date of Birth: [birth year] Medical Record #[id number]  History of Present Illness: Renee Ray is seen today for a follow up visit. She is seen for [name]. She has chronic atrial fib and is on coumadin. Needs INR today. Other problems include HTN and asthma. She did have pneumonia associated with a UTI and rhado with subsequent renal failure back in October. This has resolved.  She comes in today. She is doing well. Her INR's have been good. No significant bruising and no bleeding reported. She is not having chest pain. No shortness of breath. She has gotten more inactive due to bilateral knee troubles. She is currently seeing Dr. Lovey Newcomer and is planning on arthroscopy in the spring. Her weight is climbing due to her inactivity. She has not been checking her blood pressure. She has had her medicines today. She remains off of her statin and we will be checking her labs today. This will need to be readdressed. She sees [name] later this month for her diabetes.  Current Outpatient Prescriptions on File Prior to Visit  Medication Sig Dispense Refill  . aspirin 81 MG tablet Take 81 mg by mouth daily.        [name] b complex vitamins capsule Take 1 capsule by mouth daily.        . calcium carbonate (OS-CAL) 600 MG TABS Take 600 mg by mouth daily.        . cholecalciferol (VITAMIN D) 1000 UNITS tablet Take 1,000 Units by mouth daily.        . Flaxseed, Linseed, (FLAXSEED OIL PO) Take by mouth daily.        . insulin glargine (LANTUS) 100 UNIT/ML injection Inject into the skin at bedtime. Taking 14units daily      . JANUVIA 50 MG tablet Take 1 tablet by mouth daily.      . Multiple Vitamin (MULTIVITAMIN) tablet Take 1 tablet by mouth daily.        [name] PROAIR HFA 108 (90 BASE) MCG/ACT inhaler INHALE 2 PUFFS 3 TIMES A DAY  8.5 g  3  . tiotropium (SPIRIVA) 18 MCG inhalation capsule Place 1 capsule (18 mcg total) into inhaler and inhale daily.  30 capsule  prn  . valsartan-hydrochlorothiazide  (DIOVAN-HCT) 320-25 MG per tablet Take 1 tablet by mouth daily.        [name] warfarin (COUMADIN) 5 MG tablet Take as directed by anticoagulation clinic  150 tablet  1    Allergies  Allergen Reactions  . Amoxicillin   . Sulfonamide Derivatives     REACTION: thrush    Past Medical History  Diagnosis Date  . Hypertension   . Diabetes mellitus   . Asthma   . Hyperlipidemia   . Chronic atrial fibrillation   . Chronic anticoagulation   . Pneumonia Oct 2012    associated with rhabdo  . Normal nuclear stress test 2008  . Abnormal echocardiogram 2010    showing slightly enlarged left atrium with normal LV function and normal PA pressures.     Past Surgical History  Procedure Date  . Partial hysterectomy   . Breast enhancement surgery     History  Smoking status  . Former Smoker  . Quit date: 05/05/1996  Smokeless tobacco  . Not on file    History  Alcohol Use No    Family History  Problem Relation Age of Onset  . Arrhythmia Mother   . Heart failure Mother   . Heart attack Father   .  Diabetes Brother     Review of Systems: The review of systems is per the HPI.  All other systems were reviewed and are negative.  Physical Exam: BP 142/88  Pulse 84  Ht 5\' 2"  (1.575 m)  Wt 185 lb (83.915 kg)  BMI 33.84 kg/m2 Her weight is up 13 pounds.  Patient is very pleasant and in no acute distress. She is overweight. Skin is warm and dry. Color is normal.  HEENT is unremarkable. Normocephalic/atraumatic. PERRL. Sclera are nonicteric. Neck is supple. No masses. No JVD. Lungs are clear. Cardiac exam shows an irregular rhythm.Her rate is controlled. Abdomen is soft. Extremities are without edema. Gait and ROM are intact. No gross neurologic deficits noted.   LABORATORY DATA: INR today is pending. Other labs are pending.   Assessment / Plan:

## 2011-12-16 NOTE — Assessment & Plan Note (Signed)
We are rechecking her labs today. Will need to consider adding back her statin therapy.

## 2011-12-21 ENCOUNTER — Telehealth: Payer: Self-pay | Admitting: *Deleted

## 2011-12-21 NOTE — Telephone Encounter (Signed)
Advised and scheduled

## 2011-12-21 NOTE — Telephone Encounter (Signed)
Message copied by Burnell Blanks on Wed Dec 21, 2011  3:24 PM ------      Message from: Rosalio Macadamia      Created: Fri Dec 16, 2011  4:10 PM       Please report. Lipids are not as good. Needs to restart her statin. Will need close follow up. Recheck in 4 weeks.

## 2011-12-27 ENCOUNTER — Encounter (HOSPITAL_COMMUNITY)
Admission: RE | Admit: 2011-12-27 | Discharge: 2011-12-27 | Disposition: A | Discharge status: Home / Self Care | Payer: Self-pay | Source: Ambulatory Visit | Attending: Internal Medicine | Admitting: Internal Medicine

## 2011-12-27 DIAGNOSIS — E119 Type 2 diabetes mellitus without complications: Secondary | ICD-10-CM | POA: Insufficient documentation

## 2011-12-27 DIAGNOSIS — J4489 Other specified chronic obstructive pulmonary disease: Secondary | ICD-10-CM | POA: Insufficient documentation

## 2011-12-27 DIAGNOSIS — E785 Hyperlipidemia, unspecified: Secondary | ICD-10-CM | POA: Insufficient documentation

## 2011-12-27 DIAGNOSIS — I4891 Unspecified atrial fibrillation: Secondary | ICD-10-CM | POA: Insufficient documentation

## 2011-12-27 DIAGNOSIS — Z5189 Encounter for other specified aftercare: Secondary | ICD-10-CM | POA: Insufficient documentation

## 2011-12-27 DIAGNOSIS — I119 Hypertensive heart disease without heart failure: Secondary | ICD-10-CM | POA: Insufficient documentation

## 2011-12-27 DIAGNOSIS — J449 Chronic obstructive pulmonary disease, unspecified: Secondary | ICD-10-CM | POA: Insufficient documentation

## 2011-12-27 DIAGNOSIS — I428 Other cardiomyopathies: Secondary | ICD-10-CM | POA: Insufficient documentation

## 2011-12-29 ENCOUNTER — Encounter (HOSPITAL_COMMUNITY)
Admission: RE | Admit: 2011-12-29 | Discharge: 2011-12-29 | Disposition: A | Discharge status: Home / Self Care | Payer: Self-pay | Source: Ambulatory Visit | Attending: Internal Medicine | Admitting: Internal Medicine

## 2012-01-03 ENCOUNTER — Encounter (HOSPITAL_COMMUNITY)
Admission: RE | Admit: 2012-01-03 | Discharge: 2012-01-03 | Disposition: A | Discharge status: Home / Self Care | Payer: Self-pay | Source: Ambulatory Visit | Attending: Internal Medicine | Admitting: Internal Medicine

## 2012-01-04 ENCOUNTER — Telehealth: Payer: Self-pay | Admitting: Internal Medicine

## 2012-01-04 MED ORDER — AZITHROMYCIN 250 MG PO TABS: ORAL_TABLET | ORAL | Status: AC

## 2012-01-04 NOTE — Telephone Encounter (Signed)
C/o cough x 3 days intermittent productive with yellow mucus today, with wheezing. Requesting zpak to be called in to [facility] [address]. Denies fever, chills, sweats, PND, sinus pressure, headache, chest tightness. States has used zpak in the past for same symptoms with relief. Please advise, thank you.   Allergies  Allergen Reactions  . Amoxicillin   . Sulfonamide Derivatives     REACTION: thrush

## 2012-01-04 NOTE — Telephone Encounter (Signed)
Per CDY-ok zpak. Pt aware, medication sent to pharmacy.

## 2012-01-05 ENCOUNTER — Encounter (HOSPITAL_COMMUNITY)
Admission: RE | Admit: 2012-01-05 | Discharge: 2012-01-05 | Disposition: A | Discharge status: Home / Self Care | Payer: Self-pay | Source: Ambulatory Visit | Attending: Internal Medicine | Admitting: Internal Medicine

## 2012-01-10 ENCOUNTER — Encounter (HOSPITAL_COMMUNITY)
Admission: RE | Admit: 2012-01-10 | Discharge: 2012-01-10 | Disposition: A | Discharge status: Home / Self Care | Payer: Self-pay | Source: Ambulatory Visit | Attending: Internal Medicine | Admitting: Internal Medicine

## 2012-01-10 DIAGNOSIS — E119 Type 2 diabetes mellitus without complications: Secondary | ICD-10-CM | POA: Insufficient documentation

## 2012-01-10 DIAGNOSIS — I4891 Unspecified atrial fibrillation: Secondary | ICD-10-CM | POA: Insufficient documentation

## 2012-01-10 DIAGNOSIS — I119 Hypertensive heart disease without heart failure: Secondary | ICD-10-CM | POA: Insufficient documentation

## 2012-01-10 DIAGNOSIS — E785 Hyperlipidemia, unspecified: Secondary | ICD-10-CM | POA: Insufficient documentation

## 2012-01-10 DIAGNOSIS — J4489 Other specified chronic obstructive pulmonary disease: Secondary | ICD-10-CM | POA: Insufficient documentation

## 2012-01-10 DIAGNOSIS — I428 Other cardiomyopathies: Secondary | ICD-10-CM | POA: Insufficient documentation

## 2012-01-10 DIAGNOSIS — J449 Chronic obstructive pulmonary disease, unspecified: Secondary | ICD-10-CM | POA: Insufficient documentation

## 2012-01-10 DIAGNOSIS — Z5189 Encounter for other specified aftercare: Secondary | ICD-10-CM | POA: Insufficient documentation

## 2012-01-12 ENCOUNTER — Encounter (HOSPITAL_COMMUNITY)
Admission: RE | Admit: 2012-01-12 | Discharge: 2012-01-12 | Disposition: A | Discharge status: Home / Self Care | Payer: Self-pay | Source: Ambulatory Visit | Attending: Internal Medicine | Admitting: Internal Medicine

## 2012-01-12 ENCOUNTER — Other Ambulatory Visit: Payer: Self-pay | Admitting: Orthopedic Surgery

## 2012-01-16 ENCOUNTER — Telehealth: Payer: Self-pay | Admitting: Internal Medicine

## 2012-01-16 MED ORDER — PREDNISONE 10 MG PO TABS: ORAL_TABLET | ORAL | Status: AC

## 2012-01-16 NOTE — Telephone Encounter (Signed)
Per CDY, call in Prednisone 10mg  #20, 4 x 2 d, 3 x 2d, 2 x 2d 1 x 2d then stop. Pt is aware and Rx has been sent to [facility] [address].

## 2012-01-16 NOTE — Telephone Encounter (Signed)
Pt was given Zpak on 01-04-2012; pt states she is still having cough-non productive most of the time but when able to get something up its yellow in color. Also having some sinus congestion-clear in color. Wheezing and SOB as well. Would like to know if CY feels she needs another abx or what recs he has for her.   Allergies  Allergen Reactions  . Amoxicillin   . Sulfonamide Derivatives     REACTION: thrush     Please advise.

## 2012-01-17 ENCOUNTER — Encounter (HOSPITAL_COMMUNITY)
Admission: RE | Admit: 2012-01-17 | Discharge: 2012-01-17 | Disposition: A | Discharge status: Home / Self Care | Payer: Self-pay | Source: Ambulatory Visit | Attending: Internal Medicine | Admitting: Internal Medicine

## 2012-01-19 ENCOUNTER — Other Ambulatory Visit: Payer: Medicare Other | Admitting: *Deleted

## 2012-01-19 ENCOUNTER — Encounter (HOSPITAL_COMMUNITY): Payer: Self-pay

## 2012-01-19 ENCOUNTER — Ambulatory Visit (INDEPENDENT_AMBULATORY_CARE_PROVIDER_SITE_OTHER): Payer: Medicare Other | Admitting: *Deleted

## 2012-01-19 DIAGNOSIS — I428 Other cardiomyopathies: Secondary | ICD-10-CM

## 2012-01-19 DIAGNOSIS — E785 Hyperlipidemia, unspecified: Secondary | ICD-10-CM

## 2012-01-19 DIAGNOSIS — I119 Hypertensive heart disease without heart failure: Secondary | ICD-10-CM

## 2012-01-19 LAB — BASIC METABOLIC PANEL
BUN: 29 mg/dL — ABNORMAL HIGH (ref 6–23)
CO2: 28 mEq/L (ref 19–32)
Calcium: 9.3 mg/dL (ref 8.4–10.5)
GFR: 67.24 mL/min (ref >60.00)
Glucose, Bld: 83 mg/dL (ref 70–99)

## 2012-01-19 LAB — HEPATIC FUNCTION PANEL
ALT: 34 U/L (ref 0–35)
AST: 21 U/L (ref 0–37)
Bilirubin, Direct: 0.1 mg/dL (ref 0.0–0.3)
Total Bilirubin: 0.2 mg/dL — ABNORMAL LOW (ref 0.3–1.2)

## 2012-01-20 ENCOUNTER — Encounter: Payer: Self-pay | Admitting: *Deleted

## 2012-01-24 ENCOUNTER — Encounter (HOSPITAL_COMMUNITY)
Admission: RE | Admit: 2012-01-24 | Discharge: 2012-01-24 | Disposition: A | Discharge status: Home / Self Care | Payer: Self-pay | Source: Ambulatory Visit | Attending: Internal Medicine | Admitting: Internal Medicine

## 2012-01-26 ENCOUNTER — Encounter (HOSPITAL_COMMUNITY)
Admission: RE | Admit: 2012-01-26 | Discharge: 2012-01-26 | Disposition: A | Discharge status: Home / Self Care | Payer: Self-pay | Source: Ambulatory Visit | Attending: Internal Medicine | Admitting: Internal Medicine

## 2012-01-27 ENCOUNTER — Ambulatory Visit (INDEPENDENT_AMBULATORY_CARE_PROVIDER_SITE_OTHER): Payer: Medicare Other | Admitting: *Deleted

## 2012-01-27 DIAGNOSIS — I4891 Unspecified atrial fibrillation: Secondary | ICD-10-CM

## 2012-01-31 ENCOUNTER — Encounter (HOSPITAL_COMMUNITY)
Admission: RE | Admit: 2012-01-31 | Discharge: 2012-01-31 | Disposition: A | Discharge status: Home / Self Care | Payer: Self-pay | Source: Ambulatory Visit | Attending: Internal Medicine | Admitting: Internal Medicine

## 2012-02-02 ENCOUNTER — Encounter (HOSPITAL_COMMUNITY)
Admission: RE | Admit: 2012-02-02 | Discharge: 2012-02-02 | Disposition: A | Discharge status: Home / Self Care | Payer: Self-pay | Source: Ambulatory Visit | Attending: Internal Medicine | Admitting: Internal Medicine

## 2012-02-07 ENCOUNTER — Encounter (HOSPITAL_COMMUNITY)
Admission: RE | Admit: 2012-02-07 | Discharge: 2012-02-07 | Disposition: A | Discharge status: Home / Self Care | Payer: Self-pay | Source: Ambulatory Visit | Attending: Internal Medicine | Admitting: Internal Medicine

## 2012-02-07 DIAGNOSIS — Z5189 Encounter for other specified aftercare: Secondary | ICD-10-CM | POA: Insufficient documentation

## 2012-02-07 DIAGNOSIS — J4489 Other specified chronic obstructive pulmonary disease: Secondary | ICD-10-CM | POA: Insufficient documentation

## 2012-02-07 DIAGNOSIS — J449 Chronic obstructive pulmonary disease, unspecified: Secondary | ICD-10-CM | POA: Insufficient documentation

## 2012-02-07 DIAGNOSIS — E785 Hyperlipidemia, unspecified: Secondary | ICD-10-CM | POA: Insufficient documentation

## 2012-02-07 DIAGNOSIS — I428 Other cardiomyopathies: Secondary | ICD-10-CM | POA: Insufficient documentation

## 2012-02-07 DIAGNOSIS — I119 Hypertensive heart disease without heart failure: Secondary | ICD-10-CM | POA: Insufficient documentation

## 2012-02-07 DIAGNOSIS — I4891 Unspecified atrial fibrillation: Secondary | ICD-10-CM | POA: Insufficient documentation

## 2012-02-07 DIAGNOSIS — E119 Type 2 diabetes mellitus without complications: Secondary | ICD-10-CM | POA: Insufficient documentation

## 2012-02-09 ENCOUNTER — Encounter (HOSPITAL_COMMUNITY)
Admission: RE | Admit: 2012-02-09 | Discharge: 2012-02-09 | Disposition: A | Discharge status: Home / Self Care | Payer: Self-pay | Source: Ambulatory Visit | Attending: Internal Medicine | Admitting: Internal Medicine

## 2012-02-14 ENCOUNTER — Encounter (HOSPITAL_COMMUNITY): Payer: Self-pay

## 2012-02-15 ENCOUNTER — Telehealth: Payer: Self-pay | Admitting: Cardiology

## 2012-02-15 NOTE — Telephone Encounter (Signed)
Patient is having arthroscopic knee surgery on 4/26 and was advised to hold coumadin and ASA starting on 4/20.  Patient wanted to check and make sure this was ok and nothing else needed to be done.  Will forward to  [name] for review

## 2012-02-15 NOTE — Telephone Encounter (Signed)
Pt having a procedure with Dr. Clide Dales office and needs to come off coumadin 5 days prior to procedure and wants to talk to you about that

## 2012-02-16 ENCOUNTER — Encounter (HOSPITAL_COMMUNITY)
Admission: RE | Admit: 2012-02-16 | Discharge: 2012-02-16 | Disposition: A | Discharge status: Home / Self Care | Payer: Self-pay | Source: Ambulatory Visit | Attending: Internal Medicine | Admitting: Internal Medicine

## 2012-02-19 NOTE — Telephone Encounter (Signed)
She is CHADSS 2 (diabetes and hypertension) and I would suggest lovenox bridging.

## 2012-02-21 ENCOUNTER — Encounter (HOSPITAL_COMMUNITY)
Admission: RE | Admit: 2012-02-21 | Discharge: 2012-02-21 | Disposition: A | Discharge status: Home / Self Care | Payer: Self-pay | Source: Ambulatory Visit | Attending: Internal Medicine | Admitting: Internal Medicine

## 2012-02-21 NOTE — Telephone Encounter (Signed)
Left message to call back  

## 2012-02-22 ENCOUNTER — Telehealth: Payer: Self-pay | Admitting: Cardiology

## 2012-02-22 NOTE — Telephone Encounter (Signed)
Spoke with [facility] and they will just send in Rx on Friday

## 2012-02-22 NOTE — Telephone Encounter (Signed)
New Problem:    Patient called returning your call.  Please call back. 

## 2012-02-22 NOTE — Telephone Encounter (Signed)
Advised patient needs lovenox.  Sent to coumadin clinic for dosing

## 2012-02-22 NOTE — Telephone Encounter (Signed)
Renee Ray Floor 02/22/2012 9:45 AM Signed  New Problem:  Patient called returning your call. Please call back.    Advised patient would need the Lovenox injections and will be sent to [facility] [address] and bring to INR check on Friday 4/19.

## 2012-02-23 ENCOUNTER — Encounter (HOSPITAL_COMMUNITY): Payer: Self-pay

## 2012-02-23 ENCOUNTER — Ambulatory Visit (INDEPENDENT_AMBULATORY_CARE_PROVIDER_SITE_OTHER): Payer: Medicare Other | Admitting: Internal Medicine

## 2012-02-23 ENCOUNTER — Encounter: Payer: Self-pay | Admitting: Internal Medicine

## 2012-02-23 VITALS — BP 122/82 | HR 94 | Ht 64.0 in | Wt 190.8 lb

## 2012-02-23 DIAGNOSIS — J449 Chronic obstructive pulmonary disease, unspecified: Secondary | ICD-10-CM

## 2012-02-23 NOTE — Assessment & Plan Note (Signed)
Moderate obstructive airways disease with some response to bronchodilator. Currently well controlled, using primarily her Spiriva.

## 2012-02-23 NOTE — Patient Instructions (Signed)
Please call as needed 

## 2012-02-23 NOTE — Progress Notes (Signed)
09/01/11- 71 year old female former smoker followed for asthmatic bronchitis complicated by DM, CM, atrial fibrillation. Widow of Dr. Molly Maduro Biegel/neurosurgeon. She continues to work part-time at [employer]. Has had flu vaccine. Has had pneumonia vaccine. Post hospital visit now after episode of syncope with rhabdomyolysis. She feels back to normal and is able to work and to get around with her usual activities. She now has a Medic alert device. Had renal insufficiency which is being tracked by Dr.Kerr, who follows up for diabetes. He has seen her and reports stable renal and liver function. She denies cough, wheeze, chest pain, palpitation or swelling. CXR 08/25/2011-mild CE, hyperinflation, bronchitis with some atelectasis left base.  10/27/11-71 year old female former smoker followed for asthmatic bronchitis complicated by DM, CM, atrial fibrillation. Widow of Dr. Molly Maduro Difabio/neurosurgeon. She continues to work part-time at [employer]. Has had flu vaccine. Has had pneumonia vaccine. Malaise yesterday. Today woke w/ temp 101.4, cough productive green. Nose blowing clear. Scratchy throat. No muscle ache.  PFT-10/05/11- moderate COPD- FEV1/FVC0.61, DLCO 74%. She wants to re-do pulmonary rehab.  02/23/12- 71 year old female former smoker followed for asthmatic bronchitis complicated by DM, CM, atrial fibrillation. Widow of Dr. Molly Maduro Mertens/neurosurgeon. She continues to work part-time at [employer]. :"doing good overall"; recently had bronchitis-took Zpak and round of Prednisone and helped clear it up Not bothered by spring pollens. Satisfied with Spiriva and rare use of Proair.Back in Pulmonary Rehab and says it helps her.  For R knee arthroscopy with no pulmonary issues anticipated.  IMPRESSION: CXR 11/02/11 Stable. Emphysema without acute findings.  Original Report Authenticated By: ERIC A. MANSELL, M.D.   ROS-see HPI Constitutional:   No-   weight loss, night sweats,  +fevers, chills, fatigue, lassitude. HEENT:   No-  headaches, difficulty swallowing, tooth/dental problems, sore throat,       No-  sneezing, itching, ear ache, +nasal congestion, post nasal drip,  CV:  No-   chest pain, orthopnea, PND, swelling in lower extremities, anasarca, dizziness, palpitations Resp:  +Some shortness of breath with exertion or at rest.              No-   productive cough,  No non-productive cough,  No- coughing up of blood.              No-   change in color of mucus.  No- wheezing.   Skin: No-   rash or lesions. GI:  No-   heartburn, indigestion, abdominal pain, nausea, vomiting,  GU: MS:  No-   joint pain or swelling.   Neuro-     nothing unusual Psych:  No- change in mood or affect. No depression or anxiety.  No memory loss.  OBJ General- Alert, Oriented, Affect-appropriate, Distress- none acute. Looks well Skin- rash-none, lesions- none, excoriation- none; warm  Lymphadenopathy- none Head- atraumatic            Eyes- Gross vision intact, PERRLA, conjunctivae clear secretions            Ears- Hearing, canals-normal            Nose- Clear, no-Septal dev, mucus, polyps, erosion, perforation             Throat- Mallampati II , mucosa red , drainage- none, tonsils- atrophic Neck- flexible , trachea midline, no stridor , thyroid nl, carotid no bruit Chest - symmetrical excursion , unlabored           Heart/CV- AF/ IRR , no murmur , no gallop  , no rub,  nl s1 s2                           - JVD- none , edema- none, stasis changes- none, varices- none           Lung- no wheeze, unlabored, cough- none , dullness-none, rub- none           Chest wall-  Abd- Br/ Gen/ Rectal- Not done, not indicated Extrem- cyanosis- none, clubbing, none, atrophy- none, strength- nl Neuro- grossly intact to observation

## 2012-02-24 ENCOUNTER — Ambulatory Visit (INDEPENDENT_AMBULATORY_CARE_PROVIDER_SITE_OTHER): Payer: Medicare Other | Admitting: *Deleted

## 2012-02-24 ENCOUNTER — Other Ambulatory Visit: Payer: Self-pay | Admitting: Cardiology

## 2012-02-24 DIAGNOSIS — I4891 Unspecified atrial fibrillation: Secondary | ICD-10-CM

## 2012-02-24 MED ORDER — ENOXAPARIN SODIUM 120 MG/0.8ML ~~LOC~~ SOLN: 120.0000 mg | Freq: Every day | SUBCUTANEOUS | Status: DC

## 2012-02-24 NOTE — Patient Instructions (Signed)
02/25/2012 take last dose of Coumadin 04.21/2013 no coumadin nor Lovenox 02/27/2012  No coumadin  Lovenox  120 mg daily am  02/28/2012  No coumadin Lovenox 120 mg daily am 04.24.2013 No coumadin Lovenox 120 mg daily am 03/01/2012  No coumadin Lovenox 120 mg daily am 03/02/2012  Procedure   Then restart lovenox and Coumadin as directed by MD and when coumadin is restarted take extra 1/2 tablet for 2 days Lovenox to continue until INR is 2.0 or greater

## 2012-02-27 NOTE — Telephone Encounter (Signed)
Refilled atorvastatin

## 2012-02-28 ENCOUNTER — Encounter (HOSPITAL_COMMUNITY)
Admission: RE | Admit: 2012-02-28 | Discharge: 2012-02-28 | Disposition: A | Discharge status: Home / Self Care | Payer: Self-pay | Source: Ambulatory Visit | Attending: Internal Medicine | Admitting: Internal Medicine

## 2012-02-28 ENCOUNTER — Encounter (HOSPITAL_BASED_OUTPATIENT_CLINIC_OR_DEPARTMENT_OTHER): Payer: Self-pay | Admitting: *Deleted

## 2012-02-28 NOTE — Progress Notes (Signed)
NPO AFTER MN. ARRIVES AT 0700. NEEDS ISTAT . CURRENT EKG IN EPIC AND IN CHART. WILL TAKE LIPITOR AND DO SPIRIVA AM OF SURG. W/ SIP OF WATER. ALSO, WILL BRING PROAIR INHALER.

## 2012-03-01 ENCOUNTER — Encounter (HOSPITAL_COMMUNITY): Payer: Self-pay

## 2012-03-01 NOTE — H&P (Signed)
CC- Renee Ray is a 71 y.o. female who presents with left knee pain.  HPI- . Knee Pain: Patient presents with knee pain involving the  left knee. Onset of the symptoms was several months ago. Inciting event: none known. Current symptoms include pain located medially. Pain is aggravated by kneeling, rising after sitting and squatting.  Patient has had no prior knee problems. Evalultefation to date: MRI: abnormal medial meniscal tear. Treatment to date: rest.  Past Medical History  Diagnosis Date  . Hypertension   . Hyperlipidemia   . Chronic anticoagulation COUMADIN  . Normal nuclear stress test 2008  . Abnormal echocardiogram 2010    showing slightly enlarged left atrium with normal LV function and normal PA pressures.   . Chronic atrial fibrillation CARDIOLOGIST- DR BRACKBILL-- LAST VISIT  12-16-2011 IN EPIC  . Acute medial meniscal injury of knee RIGHT KNEE  . Asthmatic bronchitis , chronic   . OA (osteoarthritis) of knee HANDS  . History of pneumonia associated w/  rhabdomyolysis  OCT 2012  . COPD, moderate PULMOLOGIST- DR YOUNG --  LAST VISIT NOTE 02-23-2012 IN EPIC  . Bruise RIGHT ABD. DUE TO LOVENOX INJECTION  02-27-2012  . Chronic renal insufficiency   . Diabetes mellitus INSULIN AND ORAL MEDS  . Non-ischemic cardiomyopathy normal ef  . COPD with asthma     Past Surgical History  Procedure Date  . Breast enhancement surgery 1970    1990--- BREAST IMPLANTS REMOVED   . Vaginal hysterectomy 1978    Prior to Admission medications   Medication Sig Start Date End Date Taking? Authorizing Provider  albuterol [facility] HFA) 108 (90 BASE) MCG/ACT inhaler  11/08/11 11/08/12 Yes Waymon Budge, MD  aspirin 81 MG tablet Take 81 mg by mouth daily.    Yes Historical Provider, MD  atorvastatin (LIPITOR) 80 MG tablet  02/24/12  Yes Cassell Clement, MD  b complex vitamins capsule Take 1 capsule by mouth daily.    Yes Historical Provider, MD  calcium carbonate (OS-CAL) 600 MG TABS Take  600 mg by mouth daily.    Yes Historical Provider, MD  cholecalciferol (VITAMIN D) 1000 UNITS tablet Take 1,000 Units by mouth daily.    Yes Historical Provider, MD  enoxaparin (LOVENOX) 120 MG/0.8ML injection Inject 120 mg into the skin daily. 02/27/12  Yes Cassell Clement, MD  Flaxseed, Linseed, (FLAXSEED OIL PO) Take by mouth daily.    Yes Historical Provider, MD  insulin glargine (LANTUS) 100 UNIT/ML injection Inject 26 Units into the skin at bedtime.    Yes Historical Provider, MD  JANUVIA 50 MG tablet Take 50 mg by mouth daily.  08/29/11  Yes Historical Provider, MD  Multiple Vitamin (MULTIVITAMIN) tablet Take 1 tablet by mouth daily.    Yes Historical Provider, MD  tiotropium (SPIRIVA) 18 MCG inhalation capsule Place 18 mcg into inhaler and inhale every morning. 10/27/11 10/26/12 Yes Clinton D Young, MD  valsartan-hydrochlorothiazide (DIOVAN-HCT) 320-25 MG per tablet Take 1 tablet by mouth daily.    Yes Historical Provider, MD  warfarin (COUMADIN) 5 MG tablet Take by mouth daily. Take as directed by anticoagulation clinic  PT STOPPED DUE TO SURG. COMING UP ON 03-02-2012-- WILL RESTART PER MD INSTRUCTIONS AFTER SURG. 11/18/11   Cassell Clement, MD   KNEE EXAM antalgic gait, soft tissue tenderness over medial joint line, negative drawer sign, collateral ligaments intact, normal ipsilateral hip exam  Physical Examination: General appearance - alert, well appearing, and in no distress Mental status - alert, oriented to  person, place, and time Chest - clear to auscultation, no wheezes, rales or rhonchi, symmetric air entry Heart - normal rate, regular rhythm, normal S1, S2, no murmurs, rubs, clicks or gallops Abdomen - soft, nontender, nondistended, no masses or organomegaly Neurological - alert, oriented, normal speech, no focal findings or movement disorder noted   Asessment/Plan--- Left knee medial meniscal tear- - Plan left knee arthroscopy with meniscal debridement. Procedure risks and  potential comps discussed with patient who elects to proceed. Goals are decreased pain and increased function with a high likelihood of achieving both

## 2012-03-02 ENCOUNTER — Encounter (HOSPITAL_BASED_OUTPATIENT_CLINIC_OR_DEPARTMENT_OTHER)
Admission: RE | Disposition: A | Discharge status: Home / Self Care | Payer: Self-pay | Source: Ambulatory Visit | Attending: Orthopedic Surgery

## 2012-03-02 ENCOUNTER — Ambulatory Visit (HOSPITAL_BASED_OUTPATIENT_CLINIC_OR_DEPARTMENT_OTHER)
Admission: RE | Admit: 2012-03-02 | Discharge: 2012-03-02 | Disposition: A | Discharge status: Home / Self Care | Payer: Medicare Other | Source: Ambulatory Visit | Attending: Orthopedic Surgery | Admitting: Orthopedic Surgery

## 2012-03-02 ENCOUNTER — Encounter (HOSPITAL_BASED_OUTPATIENT_CLINIC_OR_DEPARTMENT_OTHER): Payer: Self-pay

## 2012-03-02 ENCOUNTER — Ambulatory Visit (HOSPITAL_BASED_OUTPATIENT_CLINIC_OR_DEPARTMENT_OTHER): Payer: Medicare Other | Admitting: Anesthesiology

## 2012-03-02 ENCOUNTER — Encounter (HOSPITAL_BASED_OUTPATIENT_CLINIC_OR_DEPARTMENT_OTHER): Payer: Self-pay | Admitting: Anesthesiology

## 2012-03-02 DIAGNOSIS — I129 Hypertensive chronic kidney disease with stage 1 through stage 4 chronic kidney disease, or unspecified chronic kidney disease: Secondary | ICD-10-CM | POA: Insufficient documentation

## 2012-03-02 DIAGNOSIS — J449 Chronic obstructive pulmonary disease, unspecified: Secondary | ICD-10-CM | POA: Insufficient documentation

## 2012-03-02 DIAGNOSIS — N189 Chronic kidney disease, unspecified: Secondary | ICD-10-CM | POA: Insufficient documentation

## 2012-03-02 DIAGNOSIS — J4489 Other specified chronic obstructive pulmonary disease: Secondary | ICD-10-CM | POA: Insufficient documentation

## 2012-03-02 DIAGNOSIS — Z79899 Other long term (current) drug therapy: Secondary | ICD-10-CM | POA: Insufficient documentation

## 2012-03-02 DIAGNOSIS — E785 Hyperlipidemia, unspecified: Secondary | ICD-10-CM | POA: Insufficient documentation

## 2012-03-02 DIAGNOSIS — E119 Type 2 diabetes mellitus without complications: Secondary | ICD-10-CM | POA: Insufficient documentation

## 2012-03-02 DIAGNOSIS — X58XXXA Exposure to other specified factors, initial encounter: Secondary | ICD-10-CM | POA: Insufficient documentation

## 2012-03-02 DIAGNOSIS — IMO0002 Reserved for concepts with insufficient information to code with codable children: Secondary | ICD-10-CM | POA: Insufficient documentation

## 2012-03-02 DIAGNOSIS — S83249A Other tear of medial meniscus, current injury, unspecified knee, initial encounter: Secondary | ICD-10-CM | POA: Diagnosis present

## 2012-03-02 DIAGNOSIS — Z4901 Encounter for fitting and adjustment of extracorporeal dialysis catheter: Secondary | ICD-10-CM | POA: Insufficient documentation

## 2012-03-02 HISTORY — DX: Other injury of unspecified body region, initial encounter: T14.8XXA

## 2012-03-02 HISTORY — DX: Chronic obstructive pulmonary disease, unspecified: J44.9

## 2012-03-02 HISTORY — DX: Other cardiomyopathies: I42.8

## 2012-03-02 HISTORY — PX: KNEE ARTHROSCOPY: SHX127

## 2012-03-02 HISTORY — DX: Other specified chronic obstructive pulmonary disease: J44.89

## 2012-03-02 HISTORY — DX: Chronic kidney disease, unspecified: N18.9

## 2012-03-02 HISTORY — DX: Sprain of other specified parts of unspecified knee, initial encounter: S83.8X9A

## 2012-03-02 HISTORY — DX: Personal history of pneumonia (recurrent): Z87.01

## 2012-03-02 LAB — GLUCOSE, CAPILLARY: Glucose-Capillary: 152 mg/dL — ABNORMAL HIGH (ref 70–99)

## 2012-03-02 LAB — POCT I-STAT 4, (NA,K, GLUC, HGB,HCT)
Potassium: 3.2 mEq/L — ABNORMAL LOW (ref 3.5–5.1)
Sodium: 142 mEq/L (ref 135–145)

## 2012-03-02 SURGERY — ARTHROSCOPY, KNEE
Anesthesia: General | Site: Knee | Laterality: Right | Wound class: Clean

## 2012-03-02 MED ORDER — CHLORHEXIDINE GLUCONATE 4 % EX LIQD: 60.0000 mL | Freq: Once | CUTANEOUS | Status: DC

## 2012-03-02 MED ORDER — LACTATED RINGERS IV SOLN
INTRAVENOUS | Status: DC | PRN
  Administered 2012-03-02 (×2): via INTRAVENOUS

## 2012-03-02 MED ORDER — BUPIVACAINE-EPINEPHRINE 0.25% -1:200000 IJ SOLN
INTRAMUSCULAR | Status: DC | PRN
  Administered 2012-03-02: 25 mL

## 2012-03-02 MED ORDER — SODIUM CHLORIDE 0.9 % IR SOLN
Status: DC | PRN
  Administered 2012-03-02: 6000 mL

## 2012-03-02 MED ORDER — FENTANYL CITRATE 0.05 MG/ML IJ SOLN: 25.0000 ug | INTRAMUSCULAR | Status: DC | PRN

## 2012-03-02 MED ORDER — DEXAMETHASONE SODIUM PHOSPHATE 4 MG/ML IJ SOLN
INTRAMUSCULAR | Status: DC | PRN
  Administered 2012-03-02: 4 mg via INTRAVENOUS

## 2012-03-02 MED ORDER — KETOROLAC TROMETHAMINE 30 MG/ML IJ SOLN
INTRAMUSCULAR | Status: DC | PRN
  Administered 2012-03-02: 30 mg via INTRAVENOUS

## 2012-03-02 MED ORDER — LACTATED RINGERS IV SOLN
INTRAVENOUS | Status: DC
  Administered 2012-03-02: 08:00:00 via INTRAVENOUS

## 2012-03-02 MED ORDER — PROMETHAZINE HCL 25 MG/ML IJ SOLN: 6.2500 mg | INTRAMUSCULAR | Status: DC | PRN

## 2012-03-02 MED ORDER — OXYCODONE-ACETAMINOPHEN 5-325 MG PO TABS: 1.0000 | ORAL_TABLET | ORAL | Status: AC | PRN

## 2012-03-02 MED ORDER — SODIUM CHLORIDE 0.9 % IV SOLN: INTRAVENOUS | Status: DC

## 2012-03-02 MED ORDER — ONDANSETRON HCL 4 MG/2ML IJ SOLN
INTRAMUSCULAR | Status: DC | PRN
  Administered 2012-03-02: 4 mg via INTRAVENOUS

## 2012-03-02 MED ORDER — VANCOMYCIN HCL IN DEXTROSE 1-5 GM/200ML-% IV SOLN
1000.0000 mg | INTRAVENOUS | Status: AC
  Administered 2012-03-02: 1000 mg via INTRAVENOUS

## 2012-03-02 MED ORDER — LIDOCAINE HCL (CARDIAC) 20 MG/ML IV SOLN
INTRAVENOUS | Status: DC | PRN
  Administered 2012-03-02: 50 mg via INTRAVENOUS

## 2012-03-02 MED ORDER — PROPOFOL 10 MG/ML IV EMUL
INTRAVENOUS | Status: DC | PRN
  Administered 2012-03-02: 200 mg via INTRAVENOUS

## 2012-03-02 MED ORDER — DEXAMETHASONE SODIUM PHOSPHATE 10 MG/ML IJ SOLN: 10.0000 mg | Freq: Once | INTRAMUSCULAR | Status: DC

## 2012-03-02 MED ORDER — METHOCARBAMOL 500 MG PO TABS: 500.0000 mg | ORAL_TABLET | Freq: Four times a day (QID) | ORAL | Status: AC

## 2012-03-02 MED ORDER — ACETAMINOPHEN 10 MG/ML IV SOLN
1000.0000 mg | Freq: Once | INTRAVENOUS | Status: AC
  Administered 2012-03-02: 1000 mg via INTRAVENOUS

## 2012-03-02 MED ORDER — LACTATED RINGERS IV SOLN: INTRAVENOUS | Status: DC

## 2012-03-02 MED ORDER — FENTANYL CITRATE 0.05 MG/ML IJ SOLN
INTRAMUSCULAR | Status: DC | PRN
  Administered 2012-03-02: 25 ug via INTRAVENOUS
  Administered 2012-03-02 (×3): 50 ug via INTRAVENOUS
  Administered 2012-03-02: 25 ug via INTRAVENOUS

## 2012-03-02 SURGICAL SUPPLY — 39 items
BANDAGE ELASTIC 6 VELCRO ST LF (GAUZE/BANDAGES/DRESSINGS) ×2 IMPLANT
BLADE 4.2CUDA (BLADE) ×2 IMPLANT
BLADE CUDA SHAVER 3.5 (BLADE) IMPLANT
BLADE CUTTER GATOR 3.5 (BLADE) IMPLANT
CANISTER SUCT LVC 12 LTR MEDI- (MISCELLANEOUS) ×2 IMPLANT
CANISTER SUCTION 2500CC (MISCELLANEOUS) IMPLANT
CLOTH BEACON ORANGE TIMEOUT ST (SAFETY) ×2 IMPLANT
DRAPE ARTHROSCOPY W/POUCH 114 (DRAPES) ×2 IMPLANT
DRSG EMULSION OIL 3X3 NADH (GAUZE/BANDAGES/DRESSINGS) ×2 IMPLANT
DRSG PAD ABDOMINAL 8X10 ST (GAUZE/BANDAGES/DRESSINGS) ×1 IMPLANT
DURAPREP 26ML APPLICATOR (WOUND CARE) ×2 IMPLANT
ELECT MENISCUS 165MM 90D (ELECTRODE) IMPLANT
ELECT REM PT RETURN 9FT ADLT (ELECTROSURGICAL)
ELECTRODE REM PT RTRN 9FT ADLT (ELECTROSURGICAL) IMPLANT
GLOVE BIO SURGEON STRL SZ8 (GLOVE) ×2 IMPLANT
GLOVE BIOGEL M STER SZ 6 (GLOVE) ×1 IMPLANT
GLOVE BIOGEL PI IND STRL 6.5 (GLOVE) IMPLANT
GLOVE BIOGEL PI INDICATOR 6.5 (GLOVE) ×1
GLOVE ECLIPSE 6.5 STRL STRAW (GLOVE) ×1 IMPLANT
GLOVE INDICATOR 8.0 STRL GRN (GLOVE) ×2 IMPLANT
GLOVE SKINSENSE STRL SZ6.0 (GLOVE) ×1 IMPLANT
GOWN PREVENTION PLUS LG XLONG (DISPOSABLE) ×4 IMPLANT
GOWN SURGICAL LARGE (GOWNS) ×2 IMPLANT
IV NS IRRIG 3000ML ARTHROMATIC (IV SOLUTION) ×3 IMPLANT
KNEE WRAP E Z 3 GEL PACK (MISCELLANEOUS) ×2 IMPLANT
PACK ARTHROSCOPY DSU (CUSTOM PROCEDURE TRAY) ×2 IMPLANT
PACK BASIN DAY SURGERY FS (CUSTOM PROCEDURE TRAY) ×2 IMPLANT
PADDING CAST ABS 4INX4YD NS (CAST SUPPLIES) ×1
PADDING CAST ABS COTTON 4X4 ST (CAST SUPPLIES) ×1 IMPLANT
PADDING CAST COTTON 6X4 STRL (CAST SUPPLIES) ×2 IMPLANT
PENCIL BUTTON HOLSTER BLD 10FT (ELECTRODE) IMPLANT
SET ARTHROSCOPY TUBING (MISCELLANEOUS) ×2
SET ARTHROSCOPY TUBING LN (MISCELLANEOUS) ×1 IMPLANT
SPONGE GAUZE 4X4 12PLY (GAUZE/BANDAGES/DRESSINGS) ×2 IMPLANT
SUT ETHILON 4 0 PS 2 18 (SUTURE) ×2 IMPLANT
TOWEL OR 17X24 6PK STRL BLUE (TOWEL DISPOSABLE) ×2 IMPLANT
WAND 30 DEG SABER W/CORD (SURGICAL WAND) IMPLANT
WAND 90 DEG TURBOVAC W/CORD (SURGICAL WAND) ×1 IMPLANT
WATER STERILE IRR 500ML POUR (IV SOLUTION) ×2 IMPLANT

## 2012-03-02 NOTE — Anesthesia Postprocedure Evaluation (Signed)
Anesthesia Post Note  Patient: Renee Ray  Procedure(s) Performed: Procedure(s) (LRB): ARTHROSCOPY KNEE (Right)  Anesthesia type: General  Patient location: PACU  Post pain: Pain level controlled  Post assessment: Post-op Vital signs reviewed  Last Vitals:  Filed Vitals:   03/02/12 1012  BP: 166/83  Pulse: 78  Temp:   Resp: 11    Post vital signs: Reviewed  Level of consciousness: sedated  Complications: No apparent anesthesia complications

## 2012-03-02 NOTE — Anesthesia Procedure Notes (Signed)
Procedure Name: LMA Insertion Date/Time: 03/02/2012 9:02 AM Performed by: Fran Lowes Pre-anesthesia Checklist: Patient identified, Emergency Drugs available, Suction available and Patient being monitored Patient Re-evaluated:Patient Re-evaluated prior to inductionOxygen Delivery Method: Circle System Utilized Preoxygenation: Pre-oxygenation with 100% oxygen Intubation Type: IV induction Ventilation: Mask ventilation without difficulty LMA: LMA inserted LMA Size: 4.0 Number of attempts: 1 Airway Equipment and Method: bite block Placement Confirmation: positive ETCO2 Tube secured with: Tape Dental Injury: Teeth and Oropharynx as per pre-operative assessment  Comments: 1st attempt W/ #4 LMA supreme. Not a good fit.

## 2012-03-02 NOTE — Transfer of Care (Signed)
Immediate Anesthesia Transfer of Care Note Immediate Anesthesia Transfer of Care Note  Patient: Renee Ray  Procedure(s) Performed: Procedure(s) (LRB): ARTHROSCOPY KNEE (Right)  Patient Location: PACU  Anesthesia Type: General  Level of Consciousness: awake, sedated, patient cooperative and responds to stimulation  Airway & Oxygen Therapy: Patient Spontanous Breathing and Patient connected to face mask oxygen  Post-op Assessment: Report given to PACU RN, Post -op Vital signs reviewed and stable and Patient moving all extremities  Post vital signs: Reviewed and stable  Complications: No apparent anesthesia complications

## 2012-03-02 NOTE — Interval H&P Note (Signed)
History and Physical Interval Note:  03/02/2012 8:52 AM  Renee Ray  has presented today for surgery, with the diagnosis of RIGHT KNEE MEDIAL MENISCAL TEAR  The various methods of treatment have been discussed with the patient and family. After consideration of risks, benefits and other options for treatment, the patient has consented to  Procedure(s) (LRB): ARTHROSCOPY KNEE (Right) as a surgical intervention .  The patients' history has been reviewed, patient examined, no change in status, stable for surgery.  I have reviewed the patients' chart and labs.  Questions were answered to the patient's satisfaction.     Loanne Drilling

## 2012-03-02 NOTE — Discharge Instructions (Signed)
Arthroscopic Procedure, Knee An arthroscopic procedure can find what is wrong with your knee. PROCEDURE Arthroscopy is a surgical technique that allows your orthopedic surgeon to diagnose and treat your knee injury with accuracy. They will look into your knee through a small instrument. This is almost like a small (pencil sized) telescope. Because arthroscopy affects your knee less than open knee surgery, you can anticipate a more rapid recovery. Taking an active role by following your caregiver's instructions will help with rapid and complete recovery. Use crutches, rest, elevation, ice, and knee exercises as instructed. The length of recovery depends on various factors including type of injury, age, physical condition, medical conditions, and your rehabilitation. Your knee is the joint between the large bones (femur and tibia) in your leg. Cartilage covers these bone ends which are smooth and slippery and allow your knee to bend and move smoothly. Two menisci, thick, semi-lunar shaped pads of cartilage which form a rim inside the joint, help absorb shock and stabilize your knee. Ligaments bind the bones together and support your knee joint. Muscles move the joint, help support your knee, and take stress off the joint itself. Because of this all programs and physical therapy to rehabilitate an injured or repaired knee require rebuilding and strengthening your muscles. AFTER THE PROCEDURE  After the procedure, you will be moved to a recovery area until most of the effects of the medication have worn off. Your caregiver will discuss the test results with you.   Only take over-the-counter or prescription medicines for pain, discomfort, or fever as directed by your caregiver.  SEEK MEDICAL CARE IF:   You have increased bleeding from your wounds.   You see redness, swelling, or have increasing pain in your wounds.   You have pus coming from your wound.   You have an oral temperature above 102 F (38.9  C).   You notice a bad smell coming from the wound or dressing.   You have severe pain with any motion of your knee.  SEEK IMMEDIATE MEDICAL CARE IF:   You develop a rash.   You have difficulty breathing.   You have any allergic problems.  Document Released: 10/21/2000 Document Revised: 10/13/2011 Document Reviewed: 05/14/2008 [facility] Patient Information 2012 [place], [place]. Post Anesthesia Home Care Instructions  Activity: Get plenty of rest for the remainder of the day. A responsible adult should stay with you for 24 hours following the procedure.  For the next 24 hours, DO NOT: -Drive a car -Advertising copywriter -Drink alcoholic beverages -Take any medication unless instructed by your physician -Make any legal decisions or sign important papers.  Meals: Start with liquid foods such as gelatin or soup. Progress to regular foods as tolerated. Avoid greasy, spicy, heavy foods. If nausea and/or vomiting occur, drink only clear liquids until the nausea and/or vomiting subsides. Call your physician if vomiting continues.  Special Instructions/Symptoms: Your throat may feel dry or sore from the anesthesia or the breathing tube placed in your throat during surgery. If this causes discomfort, gargle with warm salt water. The discomfort should disappear within 24 hours.  Post Anesthesia Home Care Instructions  Activity: Get plenty of rest for the remainder of the day. A responsible adult should stay with you for 24 hours following the procedure.  For the next 24 hours, DO NOT: -Drive a car -Advertising copywriter -Drink alcoholic beverages -Take any medication unless instructed by your physician -Make any legal decisions or sign important papers.  Meals: Start with liquid foods such  as gelatin or soup. Progress to regular foods as tolerated. Avoid greasy, spicy, heavy foods. If nausea and/or vomiting occur, drink only clear liquids until the nausea and/or vomiting subsides. Call your  physician if vomiting continues.  Special Instructions/Symptoms: Your throat may feel dry or sore from the anesthesia or the breathing tube placed in your throat during surgery. If this causes discomfort, gargle with warm salt water. The discomfort should disappear within 24 hours.  Discharge Instructions After Orthopedic Procedures:  *You may feel tired and weak following your procedure. It is recommended that you limit physical activity for the next 24 hours and rest at home for the remainder of today and tomorrow. *No strenuous activity should be started without your doctor's permission.  Elevate the extremity that you had surgery on to a level above your heart. This should continue for 48 hours or as instructed by your doctor.  If you had hand, arm or shoulder surgery you should move your fingers frequently unless otherwise instructed by your doctor.  If you had foot, knee or leg surgery you should wiggle your toes frequently unless otherwise instructed by your doctor.  Follow your doctor's exact instructions for activity at home. Use your home equipment as instructed. (Crutches, hard shoes, slings etc.)  Limit your activity as instructed by your doctor.  Report to your doctor should any of the following occur: 1. Extreme swelling of your fingers or toes. 2. Inability to wiggle your fingers or toes. 3. Coldness, pale or bluish color in your fingers or toes. 4. Loss of sensation, numbness or tingling of your fingers or toes. 5. Unusual smell or odor from under your dressing or cast. 6. Excessive bleeding or drainage from the surgical site. 7. Pain not relieved by medication your doctor has prescribed for you. 8. Cast or dressing too tight (do not get your dressing or cast wet or put anything under          your dressing or cast.)  *Do not change your dressing unless instructed by your doctor or discharge nurse. Then follow exact instructions.  *Follow labeled instructions for any  medications that your doctor may have prescribed for you. *Should any questions or complications develop following your procedure, PLEASE CONTACT YOUR DOCTOR.

## 2012-03-02 NOTE — Op Note (Signed)
Preoperative diagnosis-  Right knee medial meniscal tear  Postoperative diagnosis Right- knee medial meniscal tear   Procedure- Right knee arthroscopy with medial  Meniscal debridement   Surgeon- Gus Rankin. Raushanah Osmundson, MD  Anesthesia-General  EBL-  minimal Complications- None  Condition- PACU - hemodynamically stable.  Brief clinical note- -Renee Ray is a 71 y.o.  female with a several month history of right knee pain and mechanical symptoms. Exam and history suggested medial meniscal tear confirmed by MRI. The patient presents now for arthroscopy and debridement   Procedure in detail -       After successful administration of General anesthetic, a tourmiquet is placed high on the right thigh and the Right lower extremity is prepped and draped in the usual sterile fashion. Time out is performed by the surgical team. Standard superomedial and inferolateral portal sites are marked and incisions made with an 11 blade. The inflow cannula is passed through the superomedial portal and camera through the inferolateral portal and inflow is initiated. Arthroscopic visualization proceeds.      The undersurface of the patella and trochlea are visualized and they appear normal. The medial and lateral gutters are visualized and there are  no loose bodies. Flexion and valgus force is applied to the knee and the medial compartment is entered. A spinal needle is passed into the joint through the site marked for the inferomedial portal. A small incision is made and the dilator passed into the joint. The findings for the medial compartment are medial meniscal unstable tear of body and posterior horn. The tear is debrided to a stable base with baskets and a shaver and sealed off with the Arthrocare.     The intercondylar notch is visualized and the ACL appears normal. The lateral compartment is entered and the findings are normal .      The joint is again inspected and there are no other tears, defects or  loose bodies identified. The arthroscopic equipment is then removed from the inferior portals which are closed with interrupted 4-0 nylon. 20 ml of .25% Marcaine with epinephrine are injected through the inflow cannula and the cannula is then removed and the portal closed with nylon. The incisions are cleaned and dried and a bulky sterile dressing is applied. The patient is then awakened and transported to recovery in stable condition.   03/02/2012, 9:39 AM

## 2012-03-02 NOTE — Anesthesia Preprocedure Evaluation (Addendum)
Anesthesia Evaluation  Patient identified by MRN, date of birth, ID band Patient awake    Reviewed: Allergy & Precautions, H&P , NPO status , Patient's Chart, lab work & pertinent test results  History of Anesthesia Complications Negative for: history of anesthetic complications  Airway Mallampati: II TM Distance: >3 FB Neck ROM: Full    Dental  (+) Teeth Intact, Caps and Dental Advisory Given   Pulmonary asthma , COPD COPD inhaler,  breath sounds clear to auscultation  Pulmonary exam normal       Cardiovascular hypertension, Pt. on medications + DOE + dysrhythmias Atrial Fibrillation Rhythm:Irregular Rate:Normal     Neuro/Psych negative neurological ROS  negative psych ROS   GI/Hepatic negative GI ROS, Neg liver ROS,   Endo/Other  Diabetes mellitus-, Well Controlled, Type 2, Insulin Dependent and Oral Hypoglycemic Agents  Renal/GU Renal Insufficiency and ARFRenal disease  negative genitourinary   Musculoskeletal negative musculoskeletal ROS (+) Arthritis -, Osteoarthritis,    Abdominal   Peds  Hematology negative hematology ROS (+)   Anesthesia Other Findings   Reproductive/Obstetrics negative OB ROS                           Anesthesia Physical Anesthesia Plan  ASA: III  Anesthesia Plan: General   Post-op Pain Management:    Induction: Intravenous  Airway Management Planned: LMA  Additional Equipment:   Intra-op Plan:   Post-operative Plan: Extubation in OR  Informed Consent: I have reviewed the patients History and Physical, chart, labs and discussed the procedure including the risks, benefits and alternatives for the proposed anesthesia with the patient or authorized representative who has indicated his/her understanding and acceptance.   Dental advisory given  Plan Discussed with: CRNA  Anesthesia Plan Comments:         Anesthesia Quick Evaluation

## 2012-03-05 ENCOUNTER — Encounter (HOSPITAL_BASED_OUTPATIENT_CLINIC_OR_DEPARTMENT_OTHER): Payer: Self-pay | Admitting: Orthopedic Surgery

## 2012-03-06 ENCOUNTER — Encounter (HOSPITAL_COMMUNITY): Payer: Self-pay

## 2012-03-07 ENCOUNTER — Ambulatory Visit (INDEPENDENT_AMBULATORY_CARE_PROVIDER_SITE_OTHER): Payer: Medicare Other | Admitting: Pharmacist

## 2012-03-07 DIAGNOSIS — I4891 Unspecified atrial fibrillation: Secondary | ICD-10-CM

## 2012-03-08 ENCOUNTER — Encounter (HOSPITAL_BASED_OUTPATIENT_CLINIC_OR_DEPARTMENT_OTHER): Payer: Self-pay

## 2012-03-08 ENCOUNTER — Encounter (HOSPITAL_COMMUNITY): Payer: Medicare Other | Attending: Internal Medicine

## 2012-03-08 DIAGNOSIS — Z5189 Encounter for other specified aftercare: Secondary | ICD-10-CM | POA: Insufficient documentation

## 2012-03-08 DIAGNOSIS — I119 Hypertensive heart disease without heart failure: Secondary | ICD-10-CM | POA: Insufficient documentation

## 2012-03-08 DIAGNOSIS — E119 Type 2 diabetes mellitus without complications: Secondary | ICD-10-CM | POA: Insufficient documentation

## 2012-03-08 DIAGNOSIS — I4891 Unspecified atrial fibrillation: Secondary | ICD-10-CM | POA: Insufficient documentation

## 2012-03-08 DIAGNOSIS — J449 Chronic obstructive pulmonary disease, unspecified: Secondary | ICD-10-CM | POA: Insufficient documentation

## 2012-03-08 DIAGNOSIS — E785 Hyperlipidemia, unspecified: Secondary | ICD-10-CM | POA: Insufficient documentation

## 2012-03-08 DIAGNOSIS — I428 Other cardiomyopathies: Secondary | ICD-10-CM | POA: Insufficient documentation

## 2012-03-08 DIAGNOSIS — J4489 Other specified chronic obstructive pulmonary disease: Secondary | ICD-10-CM | POA: Insufficient documentation

## 2012-03-12 ENCOUNTER — Ambulatory Visit (INDEPENDENT_AMBULATORY_CARE_PROVIDER_SITE_OTHER): Payer: Medicare Other | Admitting: Pharmacist

## 2012-03-12 DIAGNOSIS — I119 Hypertensive heart disease without heart failure: Secondary | ICD-10-CM

## 2012-03-12 DIAGNOSIS — R6889 Other general symptoms and signs: Secondary | ICD-10-CM

## 2012-03-12 DIAGNOSIS — I4891 Unspecified atrial fibrillation: Secondary | ICD-10-CM

## 2012-03-12 DIAGNOSIS — R899 Unspecified abnormal finding in specimens from other organs, systems and tissues: Secondary | ICD-10-CM

## 2012-03-12 LAB — POCT INR: INR: 2.2

## 2012-03-13 ENCOUNTER — Encounter (HOSPITAL_COMMUNITY): Payer: Medicare Other

## 2012-03-15 ENCOUNTER — Encounter (HOSPITAL_COMMUNITY): Payer: Medicare Other

## 2012-03-20 ENCOUNTER — Encounter (HOSPITAL_COMMUNITY): Payer: Medicare Other

## 2012-03-22 ENCOUNTER — Encounter (HOSPITAL_COMMUNITY): Payer: Medicare Other

## 2012-03-27 ENCOUNTER — Encounter (HOSPITAL_COMMUNITY): Payer: Medicare Other

## 2012-03-29 ENCOUNTER — Encounter (HOSPITAL_COMMUNITY): Payer: Medicare Other

## 2012-04-03 ENCOUNTER — Encounter (HOSPITAL_COMMUNITY): Payer: Medicare Other

## 2012-04-05 ENCOUNTER — Encounter (HOSPITAL_COMMUNITY): Admission: RE | Admit: 2012-04-05 | Payer: Medicare Other | Source: Ambulatory Visit

## 2012-04-09 ENCOUNTER — Ambulatory Visit (INDEPENDENT_AMBULATORY_CARE_PROVIDER_SITE_OTHER): Payer: Medicare Other | Admitting: *Deleted

## 2012-04-09 DIAGNOSIS — I4891 Unspecified atrial fibrillation: Secondary | ICD-10-CM

## 2012-04-09 DIAGNOSIS — R899 Unspecified abnormal finding in specimens from other organs, systems and tissues: Secondary | ICD-10-CM

## 2012-04-10 ENCOUNTER — Encounter (HOSPITAL_COMMUNITY)
Admission: RE | Admit: 2012-04-10 | Discharge: 2012-04-10 | Disposition: A | Discharge status: Home / Self Care | Payer: Self-pay | Source: Ambulatory Visit | Attending: Internal Medicine | Admitting: Internal Medicine

## 2012-04-10 DIAGNOSIS — J4489 Other specified chronic obstructive pulmonary disease: Secondary | ICD-10-CM | POA: Insufficient documentation

## 2012-04-10 DIAGNOSIS — I4891 Unspecified atrial fibrillation: Secondary | ICD-10-CM | POA: Insufficient documentation

## 2012-04-10 DIAGNOSIS — E785 Hyperlipidemia, unspecified: Secondary | ICD-10-CM | POA: Insufficient documentation

## 2012-04-10 DIAGNOSIS — J449 Chronic obstructive pulmonary disease, unspecified: Secondary | ICD-10-CM | POA: Insufficient documentation

## 2012-04-10 DIAGNOSIS — Z5189 Encounter for other specified aftercare: Secondary | ICD-10-CM | POA: Insufficient documentation

## 2012-04-10 DIAGNOSIS — I119 Hypertensive heart disease without heart failure: Secondary | ICD-10-CM | POA: Insufficient documentation

## 2012-04-10 DIAGNOSIS — I428 Other cardiomyopathies: Secondary | ICD-10-CM | POA: Insufficient documentation

## 2012-04-10 DIAGNOSIS — E119 Type 2 diabetes mellitus without complications: Secondary | ICD-10-CM | POA: Insufficient documentation

## 2012-04-10 NOTE — Progress Notes (Signed)
Pt return to pulmonary rehab maintenance program after time off for knee surgery. Tolerated exercise well, no c/o. Will continue to monitor and support.

## 2012-04-12 ENCOUNTER — Encounter (HOSPITAL_COMMUNITY)
Admission: RE | Admit: 2012-04-12 | Discharge: 2012-04-12 | Disposition: A | Discharge status: Home / Self Care | Payer: Self-pay | Source: Ambulatory Visit | Attending: Internal Medicine | Admitting: Internal Medicine

## 2012-04-17 ENCOUNTER — Encounter (HOSPITAL_COMMUNITY): Payer: Self-pay

## 2012-04-19 ENCOUNTER — Encounter (HOSPITAL_COMMUNITY): Payer: Self-pay

## 2012-04-24 ENCOUNTER — Encounter (HOSPITAL_COMMUNITY): Payer: Self-pay

## 2012-04-26 ENCOUNTER — Encounter (HOSPITAL_COMMUNITY)
Admission: RE | Admit: 2012-04-26 | Discharge: 2012-04-26 | Disposition: A | Discharge status: Home / Self Care | Payer: Self-pay | Source: Ambulatory Visit | Attending: Internal Medicine | Admitting: Internal Medicine

## 2012-05-01 ENCOUNTER — Encounter (HOSPITAL_COMMUNITY): Payer: Self-pay

## 2012-05-03 ENCOUNTER — Encounter (HOSPITAL_COMMUNITY)
Admission: RE | Admit: 2012-05-03 | Discharge: 2012-05-03 | Disposition: A | Discharge status: Home / Self Care | Payer: Self-pay | Source: Ambulatory Visit | Attending: Internal Medicine | Admitting: Internal Medicine

## 2012-05-03 ENCOUNTER — Ambulatory Visit (INDEPENDENT_AMBULATORY_CARE_PROVIDER_SITE_OTHER): Payer: Medicare Other

## 2012-05-03 ENCOUNTER — Ambulatory Visit (INDEPENDENT_AMBULATORY_CARE_PROVIDER_SITE_OTHER): Payer: Medicare Other | Admitting: Cardiology

## 2012-05-03 VITALS — BP 119/70 | HR 94 | Wt 190.0 lb

## 2012-05-03 DIAGNOSIS — R899 Unspecified abnormal finding in specimens from other organs, systems and tissues: Secondary | ICD-10-CM

## 2012-05-03 DIAGNOSIS — R6889 Other general symptoms and signs: Secondary | ICD-10-CM

## 2012-05-03 DIAGNOSIS — E7800 Pure hypercholesterolemia, unspecified: Secondary | ICD-10-CM

## 2012-05-03 DIAGNOSIS — E119 Type 2 diabetes mellitus without complications: Secondary | ICD-10-CM

## 2012-05-03 DIAGNOSIS — E785 Hyperlipidemia, unspecified: Secondary | ICD-10-CM

## 2012-05-03 DIAGNOSIS — I119 Hypertensive heart disease without heart failure: Secondary | ICD-10-CM

## 2012-05-03 DIAGNOSIS — I4891 Unspecified atrial fibrillation: Secondary | ICD-10-CM

## 2012-05-03 LAB — BASIC METABOLIC PANEL
BUN: 27 mg/dL — ABNORMAL HIGH (ref 6–23)
Chloride: 103 mEq/L (ref 96–112)
Creatinine, Ser: 1 mg/dL (ref 0.4–1.2)
GFR: 61.5 mL/min (ref >60.00)
Glucose, Bld: 158 mg/dL — ABNORMAL HIGH (ref 70–99)

## 2012-05-03 LAB — POCT INR: INR: 2

## 2012-05-03 LAB — HEPATIC FUNCTION PANEL
Albumin: 4 g/dL (ref 3.5–5.2)
Total Bilirubin: 0.7 mg/dL (ref 0.3–1.2)

## 2012-05-03 LAB — LIPID PANEL
Cholesterol: 177 mg/dL (ref 0–200)
LDL Cholesterol: 96 mg/dL (ref 0–99)
Triglycerides: 95 mg/dL (ref 0.0–149.0)
VLDL: 19 mg/dL (ref 0.0–40.0)

## 2012-05-03 NOTE — Assessment & Plan Note (Signed)
The patient has a history of hyperlipidemia.  Since last visit she has been on a less strict diet and her weight is up 5 pounds.  She is on Lipitor 80 mg daily.  We are checking fasting lab work today.

## 2012-05-03 NOTE — Assessment & Plan Note (Signed)
The patient has had no TIA symptoms.  She's not having any evidence of congestive heart failure.  No chest pain.

## 2012-05-03 NOTE — Assessment & Plan Note (Signed)
Her diabetes is followed by [name].  [name] has suggested that she may wish to go on a new hypoglycemic medication called Invocana.  It has occasionally caused the blood pressure to run lower and also to help people lose weight and get rid of excess fluid.  If she goes on in bulk and we will have her reduce her Diovan HCT to just half the current dose.  She states that her hemoglobin A1c's have been running in the range of 7.5 the past 2 times.

## 2012-05-03 NOTE — Patient Instructions (Addendum)
Will obtain labs today and call you with the results  When you start the Invokana you need to decrease your Diovan HCT to 1/2 tablet daily  Your physician wants you to follow-up in: 4 month You will receive a reminder letter in the mail two months in advance. If you don't receive a letter, please call our office to schedule the follow-up appointment.

## 2012-05-03 NOTE — Progress Notes (Signed)
[name] Date of Birth:  [birth year] [facility] [address] [place], [place]  [address] [phone]  Fax   [phone]  HPI: This pleasant 71 year old woman is seen for a scheduled four-month followup office visit.  She has a history of diabetes.  She has a history of high blood pressure and a history of established chronic atrial fibrillation.  She had a remote attempt at cardioversion which was unsuccessful.  She has not been having any symptoms from her atrial fibrillation.  He also has a history of asthma and is followed by pulmonary.  She participates in the pulmonary rehabilitation program.  Current Outpatient Prescriptions  Medication Sig Dispense Refill  . albuterol (PROAIR HFA) 108 (90 BASE) MCG/ACT inhaler       . aspirin 81 MG tablet Take 81 mg by mouth daily.       [name] atorvastatin (LIPITOR) 80 MG tablet       . b complex vitamins capsule Take 1 capsule by mouth daily.       . calcium carbonate (OS-CAL) 600 MG TABS Take 600 mg by mouth daily.       . cholecalciferol (VITAMIN D) 1000 UNITS tablet Take 1,000 Units by mouth daily.       . Flaxseed, Linseed, (FLAXSEED OIL PO) Take by mouth daily.       . insulin glargine (LANTUS) 100 UNIT/ML injection Inject 30 Units into the skin at bedtime.       [name] JANUVIA 50 MG tablet Take 50 mg by mouth daily.       . Multiple Vitamin (MULTIVITAMIN) tablet Take 1 tablet by mouth daily.       [name] tiotropium (SPIRIVA) 18 MCG inhalation capsule Place 18 mcg into inhaler and inhale every morning.      . valsartan-hydrochlorothiazide (DIOVAN-HCT) 320-25 MG per tablet Take 1 tablet by mouth daily.       [name] warfarin (COUMADIN) 5 MG tablet Take by mouth daily. Take as directed by anticoagulation clinic  PT STOPPED DUE TO SURG. COMING UP ON 03-02-2012-- WILL RESTART PER MD INSTRUCTIONS AFTER SURG.      . furosemide (LASIX) 40 MG tablet Take 40 mg by mouth Daily.      [name] glucose blood test strip as directed.         Allergies  Allergen Reactions  . Amoxicillin Diarrhea  . Sulfonamide Derivatives Other (See Comments)    REACTION: thrush    Patient Active Problem List  Diagnosis  . DIABETES MELLITUS  . CARDIOMYOPATHY  . ATRIAL FIBRILLATION  . ASTHMATIC BRONCHITIS, ACUTE  . Asthma with COPD  . Benign hypertensive heart disease without heart failure  . Hyperlipidemia  . Acute medial meniscal tear    History  Smoking status  . Former Smoker -- 20 years  . Types: Cigarettes  . Quit date: 05/05/1996  Smokeless tobacco  . Never Used    History  Alcohol Use No    Family History  Problem Relation Age of Onset  . Arrhythmia Mother   . Heart failure Mother   . Heart attack Father   . Diabetes Brother     Review of Systems: The patient denies any heat or cold intolerance.  No weight gain or weight loss.  The patient denies headaches or blurry vision.  There is no cough or sputum production.  The patient denies dizziness.  There is no hematuria or hematochezia.  The patient denies any muscle aches or arthritis.  The patient denies  any rash.  The patient denies frequent falling or instability.  There is no history of depression or anxiety.  All other systems were reviewed and are negative.   Physical Exam: Filed Vitals:   05/03/12 0850  BP: 119/70  Pulse: 94   the general appearance reveals a well-developed well-nourished middle-aged woman in no distress.Pupils equal and reactive.   Extraocular Movements are full.  There is no scleral icterus.  The mouth and pharynx are normal.  The neck is supple.  The carotids reveal no bruits.  The jugular venous pressure is normal.  The thyroid is not enlarged.  There is no lymphadenopathy.  The chest is clear to percussion and auscultation. There are no rales or rhonchi. Expansion of the chest is symmetrical.  The precordium is quiet.  The first heart sound is normal.  The second heart sound is physiologically split.  There is no murmur gallop  rub or click.  There is no abnormal lift or heave.  Rhythm is irregular The abdomen is soft and nontender. Bowel sounds are normal. The liver and spleen are not enlarged. There Are no abdominal masses. There are no bruits.  The pedal pulses are good.  There is no phlebitis or edema.  There is no cyanosis or clubbing. Strength is normal and symmetrical in all extremities.  There is no lateralizing weakness.  There are no sensory deficits.  The skin is warm and dry.  There is no rash.  EKG today shows atrial fibrillation with occasional PVCs, incomplete right bundle branch block, and nonspecific T-wave abnormalities and prolonged QT interval.      Assessment / Plan: Continue same medication.  Work harder on diet.  Recheck in 4 months for followup office visit.  Decrease Diovan HCT if she goes on the new diabetes medication invocana.

## 2012-05-03 NOTE — Progress Notes (Signed)
Quick Note:  Please report to patient. The recent labs are stable. Continue same medication and careful diet. LFTs up slightly consistent with fatty liver from weight gain. BS 158. LDL 96 up from last time (diabetic goal 70) Send copy to [name]. ______

## 2012-05-04 ENCOUNTER — Telehealth: Payer: Self-pay | Admitting: *Deleted

## 2012-05-04 NOTE — Telephone Encounter (Signed)
Mailed copy of labs and left message to call if any questions Will send to Dr [name]

## 2012-05-04 NOTE — Telephone Encounter (Signed)
Message copied by Burnell Blanks on Fri May 04, 2012 12:32 PM ------      Message from: Cassell Clement      Created: Thu May 03, 2012  9:20 PM       Please report to patient.  The recent labs are stable. Continue same medication and careful diet. LFTs up slightly consistent with fatty liver from weight gain. BS 158. LDL 96 up from last time (diabetic goal 70)      Send copy to [name].

## 2012-05-08 ENCOUNTER — Encounter (HOSPITAL_COMMUNITY): Payer: Self-pay

## 2012-05-08 ENCOUNTER — Other Ambulatory Visit: Payer: Self-pay | Admitting: Cardiology

## 2012-05-08 DIAGNOSIS — I428 Other cardiomyopathies: Secondary | ICD-10-CM | POA: Insufficient documentation

## 2012-05-08 DIAGNOSIS — Z5189 Encounter for other specified aftercare: Secondary | ICD-10-CM | POA: Insufficient documentation

## 2012-05-08 DIAGNOSIS — E785 Hyperlipidemia, unspecified: Secondary | ICD-10-CM | POA: Insufficient documentation

## 2012-05-08 DIAGNOSIS — J4489 Other specified chronic obstructive pulmonary disease: Secondary | ICD-10-CM | POA: Insufficient documentation

## 2012-05-08 DIAGNOSIS — I4891 Unspecified atrial fibrillation: Secondary | ICD-10-CM | POA: Insufficient documentation

## 2012-05-08 DIAGNOSIS — I119 Hypertensive heart disease without heart failure: Secondary | ICD-10-CM | POA: Insufficient documentation

## 2012-05-08 DIAGNOSIS — J449 Chronic obstructive pulmonary disease, unspecified: Secondary | ICD-10-CM | POA: Insufficient documentation

## 2012-05-08 DIAGNOSIS — E119 Type 2 diabetes mellitus without complications: Secondary | ICD-10-CM | POA: Insufficient documentation

## 2012-05-10 ENCOUNTER — Encounter (HOSPITAL_COMMUNITY): Payer: Self-pay

## 2012-05-15 ENCOUNTER — Encounter (HOSPITAL_COMMUNITY)
Admission: RE | Admit: 2012-05-15 | Discharge: 2012-05-15 | Disposition: A | Discharge status: Home / Self Care | Payer: Self-pay | Source: Ambulatory Visit | Attending: Internal Medicine | Admitting: Internal Medicine

## 2012-05-17 ENCOUNTER — Encounter (HOSPITAL_COMMUNITY)
Admission: RE | Admit: 2012-05-17 | Discharge: 2012-05-17 | Disposition: A | Discharge status: Home / Self Care | Payer: Self-pay | Source: Ambulatory Visit | Attending: Internal Medicine | Admitting: Internal Medicine

## 2012-05-22 ENCOUNTER — Encounter (HOSPITAL_COMMUNITY)
Admission: RE | Admit: 2012-05-22 | Discharge: 2012-05-22 | Disposition: A | Discharge status: Home / Self Care | Payer: Self-pay | Source: Ambulatory Visit | Attending: Internal Medicine | Admitting: Internal Medicine

## 2012-05-24 ENCOUNTER — Encounter (HOSPITAL_COMMUNITY)
Admission: RE | Admit: 2012-05-24 | Discharge: 2012-05-24 | Disposition: A | Discharge status: Home / Self Care | Payer: Self-pay | Source: Ambulatory Visit | Attending: Internal Medicine | Admitting: Internal Medicine

## 2012-05-27 ENCOUNTER — Other Ambulatory Visit: Payer: Self-pay | Admitting: Cardiology

## 2012-05-29 ENCOUNTER — Encounter (HOSPITAL_COMMUNITY): Payer: Self-pay

## 2012-05-31 ENCOUNTER — Encounter (HOSPITAL_COMMUNITY)
Admission: RE | Admit: 2012-05-31 | Discharge: 2012-05-31 | Disposition: A | Discharge status: Home / Self Care | Payer: Self-pay | Source: Ambulatory Visit | Attending: Internal Medicine | Admitting: Internal Medicine

## 2012-05-31 ENCOUNTER — Ambulatory Visit (INDEPENDENT_AMBULATORY_CARE_PROVIDER_SITE_OTHER): Payer: Medicare Other | Admitting: *Deleted

## 2012-05-31 DIAGNOSIS — R6889 Other general symptoms and signs: Secondary | ICD-10-CM

## 2012-05-31 DIAGNOSIS — I4891 Unspecified atrial fibrillation: Secondary | ICD-10-CM

## 2012-05-31 DIAGNOSIS — R899 Unspecified abnormal finding in specimens from other organs, systems and tissues: Secondary | ICD-10-CM

## 2012-05-31 LAB — POCT INR: INR: 2.5

## 2012-06-05 ENCOUNTER — Encounter (HOSPITAL_COMMUNITY): Payer: Self-pay

## 2012-06-07 ENCOUNTER — Encounter (HOSPITAL_COMMUNITY): Payer: Self-pay

## 2012-06-07 DIAGNOSIS — I119 Hypertensive heart disease without heart failure: Secondary | ICD-10-CM | POA: Insufficient documentation

## 2012-06-07 DIAGNOSIS — I4891 Unspecified atrial fibrillation: Secondary | ICD-10-CM | POA: Insufficient documentation

## 2012-06-07 DIAGNOSIS — J4489 Other specified chronic obstructive pulmonary disease: Secondary | ICD-10-CM | POA: Insufficient documentation

## 2012-06-07 DIAGNOSIS — E785 Hyperlipidemia, unspecified: Secondary | ICD-10-CM | POA: Insufficient documentation

## 2012-06-07 DIAGNOSIS — Z5189 Encounter for other specified aftercare: Secondary | ICD-10-CM | POA: Insufficient documentation

## 2012-06-07 DIAGNOSIS — E119 Type 2 diabetes mellitus without complications: Secondary | ICD-10-CM | POA: Insufficient documentation

## 2012-06-07 DIAGNOSIS — J449 Chronic obstructive pulmonary disease, unspecified: Secondary | ICD-10-CM | POA: Insufficient documentation

## 2012-06-07 DIAGNOSIS — I428 Other cardiomyopathies: Secondary | ICD-10-CM | POA: Insufficient documentation

## 2012-06-12 ENCOUNTER — Encounter (HOSPITAL_COMMUNITY)
Admission: RE | Admit: 2012-06-12 | Discharge: 2012-06-12 | Disposition: A | Discharge status: Home / Self Care | Payer: Self-pay | Source: Ambulatory Visit | Attending: Internal Medicine | Admitting: Internal Medicine

## 2012-06-14 ENCOUNTER — Encounter (HOSPITAL_COMMUNITY)
Admission: RE | Admit: 2012-06-14 | Discharge: 2012-06-14 | Disposition: A | Discharge status: Home / Self Care | Payer: Self-pay | Source: Ambulatory Visit | Attending: Internal Medicine | Admitting: Internal Medicine

## 2012-06-19 ENCOUNTER — Encounter (HOSPITAL_COMMUNITY)
Admission: RE | Admit: 2012-06-19 | Discharge: 2012-06-19 | Disposition: A | Discharge status: Home / Self Care | Payer: Self-pay | Source: Ambulatory Visit | Attending: Internal Medicine | Admitting: Internal Medicine

## 2012-06-21 ENCOUNTER — Encounter (HOSPITAL_COMMUNITY)
Admission: RE | Admit: 2012-06-21 | Discharge: 2012-06-21 | Disposition: A | Discharge status: Home / Self Care | Payer: Self-pay | Source: Ambulatory Visit | Attending: Internal Medicine | Admitting: Internal Medicine

## 2012-06-26 ENCOUNTER — Encounter (HOSPITAL_COMMUNITY)
Admission: RE | Admit: 2012-06-26 | Discharge: 2012-06-26 | Disposition: A | Discharge status: Home / Self Care | Payer: Self-pay | Source: Ambulatory Visit | Attending: Internal Medicine | Admitting: Internal Medicine

## 2012-06-28 ENCOUNTER — Encounter (HOSPITAL_COMMUNITY)
Admission: RE | Admit: 2012-06-28 | Discharge: 2012-06-28 | Disposition: A | Discharge status: Home / Self Care | Payer: Self-pay | Source: Ambulatory Visit | Attending: Internal Medicine | Admitting: Internal Medicine

## 2012-07-03 ENCOUNTER — Encounter (HOSPITAL_COMMUNITY): Payer: Self-pay

## 2012-07-05 ENCOUNTER — Encounter (HOSPITAL_COMMUNITY)
Admission: RE | Admit: 2012-07-05 | Discharge: 2012-07-05 | Disposition: A | Discharge status: Home / Self Care | Payer: Self-pay | Source: Ambulatory Visit | Attending: Internal Medicine | Admitting: Internal Medicine

## 2012-07-10 ENCOUNTER — Encounter (HOSPITAL_COMMUNITY)
Admission: RE | Admit: 2012-07-10 | Discharge: 2012-07-10 | Disposition: A | Discharge status: Home / Self Care | Payer: Self-pay | Source: Ambulatory Visit | Attending: Internal Medicine | Admitting: Internal Medicine

## 2012-07-10 DIAGNOSIS — J449 Chronic obstructive pulmonary disease, unspecified: Secondary | ICD-10-CM | POA: Insufficient documentation

## 2012-07-10 DIAGNOSIS — J4489 Other specified chronic obstructive pulmonary disease: Secondary | ICD-10-CM | POA: Insufficient documentation

## 2012-07-10 DIAGNOSIS — Z5189 Encounter for other specified aftercare: Secondary | ICD-10-CM | POA: Insufficient documentation

## 2012-07-10 DIAGNOSIS — I119 Hypertensive heart disease without heart failure: Secondary | ICD-10-CM | POA: Insufficient documentation

## 2012-07-10 DIAGNOSIS — I428 Other cardiomyopathies: Secondary | ICD-10-CM | POA: Insufficient documentation

## 2012-07-10 DIAGNOSIS — E785 Hyperlipidemia, unspecified: Secondary | ICD-10-CM | POA: Insufficient documentation

## 2012-07-10 DIAGNOSIS — E119 Type 2 diabetes mellitus without complications: Secondary | ICD-10-CM | POA: Insufficient documentation

## 2012-07-10 DIAGNOSIS — I4891 Unspecified atrial fibrillation: Secondary | ICD-10-CM | POA: Insufficient documentation

## 2012-07-12 ENCOUNTER — Encounter (HOSPITAL_COMMUNITY)
Admission: RE | Admit: 2012-07-12 | Discharge: 2012-07-12 | Disposition: A | Discharge status: Home / Self Care | Payer: Self-pay | Source: Ambulatory Visit | Attending: Internal Medicine | Admitting: Internal Medicine

## 2012-07-12 ENCOUNTER — Ambulatory Visit (INDEPENDENT_AMBULATORY_CARE_PROVIDER_SITE_OTHER): Payer: Medicare Other | Admitting: Pharmacist

## 2012-07-12 DIAGNOSIS — I4891 Unspecified atrial fibrillation: Secondary | ICD-10-CM

## 2012-07-12 DIAGNOSIS — R899 Unspecified abnormal finding in specimens from other organs, systems and tissues: Secondary | ICD-10-CM

## 2012-07-12 DIAGNOSIS — R6889 Other general symptoms and signs: Secondary | ICD-10-CM

## 2012-07-12 LAB — POCT INR: INR: 2.4

## 2012-07-17 ENCOUNTER — Encounter (HOSPITAL_COMMUNITY): Payer: Self-pay

## 2012-07-19 ENCOUNTER — Encounter (HOSPITAL_COMMUNITY)
Admission: RE | Admit: 2012-07-19 | Discharge: 2012-07-19 | Disposition: A | Discharge status: Home / Self Care | Payer: Self-pay | Source: Ambulatory Visit | Attending: Internal Medicine | Admitting: Internal Medicine

## 2012-07-21 ENCOUNTER — Other Ambulatory Visit: Payer: Self-pay | Admitting: Cardiology

## 2012-07-23 NOTE — Telephone Encounter (Signed)
Refilled furosemide

## 2012-07-24 ENCOUNTER — Encounter (HOSPITAL_COMMUNITY): Payer: Self-pay

## 2012-07-26 ENCOUNTER — Encounter (HOSPITAL_COMMUNITY): Payer: Self-pay

## 2012-07-31 ENCOUNTER — Encounter (HOSPITAL_COMMUNITY): Payer: Self-pay

## 2012-08-02 ENCOUNTER — Encounter (HOSPITAL_COMMUNITY)
Admission: RE | Admit: 2012-08-02 | Discharge: 2012-08-02 | Disposition: A | Discharge status: Home / Self Care | Payer: Self-pay | Source: Ambulatory Visit | Attending: Internal Medicine | Admitting: Internal Medicine

## 2012-08-07 ENCOUNTER — Encounter (HOSPITAL_COMMUNITY)
Admission: RE | Admit: 2012-08-07 | Discharge: 2012-08-07 | Disposition: A | Discharge status: Home / Self Care | Payer: Self-pay | Source: Ambulatory Visit | Attending: Internal Medicine | Admitting: Internal Medicine

## 2012-08-07 DIAGNOSIS — Z5189 Encounter for other specified aftercare: Secondary | ICD-10-CM | POA: Insufficient documentation

## 2012-08-07 DIAGNOSIS — J449 Chronic obstructive pulmonary disease, unspecified: Secondary | ICD-10-CM | POA: Insufficient documentation

## 2012-08-07 DIAGNOSIS — I4891 Unspecified atrial fibrillation: Secondary | ICD-10-CM | POA: Insufficient documentation

## 2012-08-07 DIAGNOSIS — J4489 Other specified chronic obstructive pulmonary disease: Secondary | ICD-10-CM | POA: Insufficient documentation

## 2012-08-07 DIAGNOSIS — E785 Hyperlipidemia, unspecified: Secondary | ICD-10-CM | POA: Insufficient documentation

## 2012-08-07 DIAGNOSIS — I428 Other cardiomyopathies: Secondary | ICD-10-CM | POA: Insufficient documentation

## 2012-08-07 DIAGNOSIS — I119 Hypertensive heart disease without heart failure: Secondary | ICD-10-CM | POA: Insufficient documentation

## 2012-08-07 DIAGNOSIS — E119 Type 2 diabetes mellitus without complications: Secondary | ICD-10-CM | POA: Insufficient documentation

## 2012-08-09 ENCOUNTER — Encounter (HOSPITAL_COMMUNITY): Payer: Self-pay

## 2012-08-14 ENCOUNTER — Encounter (HOSPITAL_COMMUNITY)
Admission: RE | Admit: 2012-08-14 | Discharge: 2012-08-14 | Disposition: A | Discharge status: Home / Self Care | Payer: Self-pay | Source: Ambulatory Visit | Attending: Internal Medicine | Admitting: Internal Medicine

## 2012-08-16 ENCOUNTER — Encounter (HOSPITAL_COMMUNITY)
Admission: RE | Admit: 2012-08-16 | Discharge: 2012-08-16 | Disposition: A | Discharge status: Home / Self Care | Payer: Self-pay | Source: Ambulatory Visit | Attending: Internal Medicine | Admitting: Internal Medicine

## 2012-08-21 ENCOUNTER — Encounter (HOSPITAL_COMMUNITY)
Admission: RE | Admit: 2012-08-21 | Discharge: 2012-08-21 | Disposition: A | Discharge status: Home / Self Care | Payer: Self-pay | Source: Ambulatory Visit | Attending: Internal Medicine | Admitting: Internal Medicine

## 2012-08-23 ENCOUNTER — Encounter: Payer: Self-pay | Admitting: Internal Medicine

## 2012-08-23 ENCOUNTER — Ambulatory Visit (INDEPENDENT_AMBULATORY_CARE_PROVIDER_SITE_OTHER): Payer: Medicare Other | Admitting: *Deleted

## 2012-08-23 ENCOUNTER — Ambulatory Visit (INDEPENDENT_AMBULATORY_CARE_PROVIDER_SITE_OTHER): Payer: Medicare Other | Admitting: Internal Medicine

## 2012-08-23 VITALS — BP 120/62 | HR 91 | Ht 63.75 in | Wt 190.8 lb

## 2012-08-23 DIAGNOSIS — R6889 Other general symptoms and signs: Secondary | ICD-10-CM

## 2012-08-23 DIAGNOSIS — R899 Unspecified abnormal finding in specimens from other organs, systems and tissues: Secondary | ICD-10-CM

## 2012-08-23 DIAGNOSIS — I4891 Unspecified atrial fibrillation: Secondary | ICD-10-CM

## 2012-08-23 DIAGNOSIS — J449 Chronic obstructive pulmonary disease, unspecified: Secondary | ICD-10-CM

## 2012-08-23 LAB — POCT INR: INR: 1.8

## 2012-08-23 NOTE — Patient Instructions (Addendum)
Please call if needed.

## 2012-08-23 NOTE — Progress Notes (Signed)
09/01/11- 71 year old female former smoker followed for asthmatic bronchitis complicated by DM, CM, atrial fibrillation. Widow of Dr. Molly Maduro Shackett/neurosurgeon. She continues to work part-time at [employer]. Has had flu vaccine. Has had pneumonia vaccine. Post hospital visit now after episode of syncope with rhabdomyolysis. She feels back to normal and is able to work and to get around with her usual activities. She now has a Medic alert device. Had renal insufficiency which is being tracked by Dr.Kerr, who follows up for diabetes. He has seen her and reports stable renal and liver function. She denies cough, wheeze, chest pain, palpitation or swelling. CXR 08/25/2011-mild CE, hyperinflation, bronchitis with some atelectasis left base.  10/27/11-71 year old female former smoker followed for asthmatic bronchitis complicated by DM, CM, atrial fibrillation. Widow of Dr. Molly Maduro Morones/neurosurgeon. She continues to work part-time at [employer]. Has had flu vaccine. Has had pneumonia vaccine. Malaise yesterday. Today woke w/ temp 101.4, cough productive green. Nose blowing clear. Scratchy throat. No muscle ache.  PFT-10/05/11- moderate COPD- FEV1/FVC0.61, DLCO 74%. She wants to re-do pulmonary rehab.  02/23/12- 71 year old female former smoker followed for asthmatic bronchitis complicated by DM, CM, atrial fibrillation. Widow of Dr. Molly Maduro Mayr/neurosurgeon. She continues to work part-time at [employer]. :"doing good overall"; recently had bronchitis-took Zpak and round of Prednisone and helped clear it up Not bothered by spring pollens. Satisfied with Spiriva and rare use of Proair.Back in Pulmonary Rehab and says it helps her.  For R knee arthroscopy with no pulmonary issues anticipated.  IMPRESSION: CXR 11/02/11 Stable. Emphysema without acute findings.  Original Report Authenticated By: ERIC A. MANSELL, M.D.   08/23/12- 71 year old female former smoker followed for asthmatic  bronchitis complicated by DM, CM, atrial fibrillation. Widow of Dr. Molly Maduro Nuzum/neurosurgeon Denies any flare ups; no SOB or wheezing as well as no cough or congestion. She says she is head "no issues at all". Has had flu vaccine. Continues pulmonary rehabilitation. She is pleased with her status.  ROS-see HPI Constitutional:   No-   weight loss, night sweats, +fevers, chills, fatigue, lassitude. HEENT:   No-  headaches, difficulty swallowing, tooth/dental problems, sore throat,       No-  sneezing, itching, ear ache, +nasal congestion, post nasal drip,  CV:  No-   chest pain, orthopnea, PND, swelling in lower extremities, anasarca, dizziness, palpitations Resp:  +Some shortness of breath with exertion or at rest.              No-   productive cough,  No non-productive cough,  No- coughing up of blood.              No-   change in color of mucus.  No- wheezing.   Skin: No-   rash or lesions. GI:  No-   heartburn, indigestion, abdominal pain, nausea, vomiting,  GU: MS:  No-   joint pain or swelling.   Neuro-     nothing unusual Psych:  No- change in mood or affect. No depression or anxiety.  No memory loss.  OBJ General- Alert, Oriented, Affect-appropriate, Distress- none acute. Looks well Skin- rash-none, lesions- none, excoriation- none; warm  Lymphadenopathy- none Head- atraumatic            Eyes- Gross vision intact, PERRLA, conjunctivae clear secretions            Ears- Hearing, canals-normal            Nose- Clear, no-Septal dev, mucus, polyps, erosion, perforation  Throat- Mallampati II , mucosa red , drainage- none, tonsils- atrophic Neck- flexible , trachea midline, no stridor , thyroid nl, carotid no bruit Chest - symmetrical excursion , unlabored           Heart/CV- AF/ IRR , no murmur , no gallop  , no rub, nl s1 s2                           - JVD- none , edema- none, stasis changes- none, varices- none           Lung- + sounds are a little distant, no  wheeze, unlabored, cough- none , dullness-none, rub- none           Chest wall-  Abd- Br/ Gen/ Rectal- Not done, not indicated Extrem- cyanosis- none, clubbing, none, atrophy- none, strength- nl Neuro- grossly intact to observation

## 2012-08-28 ENCOUNTER — Encounter (HOSPITAL_COMMUNITY)
Admission: RE | Admit: 2012-08-28 | Discharge: 2012-08-28 | Disposition: A | Discharge status: Home / Self Care | Payer: Self-pay | Source: Ambulatory Visit | Attending: Internal Medicine | Admitting: Internal Medicine

## 2012-08-30 ENCOUNTER — Ambulatory Visit (INDEPENDENT_AMBULATORY_CARE_PROVIDER_SITE_OTHER): Payer: Medicare Other

## 2012-08-30 ENCOUNTER — Ambulatory Visit (INDEPENDENT_AMBULATORY_CARE_PROVIDER_SITE_OTHER): Payer: Medicare Other | Admitting: Family Medicine

## 2012-08-30 VITALS — BP 142/78 | HR 90 | Temp 97.6°F | Resp 16 | Ht 62.25 in | Wt 192.4 lb

## 2012-08-30 DIAGNOSIS — M7730 Calcaneal spur, unspecified foot: Secondary | ICD-10-CM

## 2012-08-30 DIAGNOSIS — M79609 Pain in unspecified limb: Secondary | ICD-10-CM

## 2012-08-30 DIAGNOSIS — M79671 Pain in right foot: Secondary | ICD-10-CM

## 2012-08-30 MED ORDER — TRAMADOL HCL 50 MG PO TABS: 50.0000 mg | ORAL_TABLET | Freq: Three times a day (TID) | ORAL | Status: DC | PRN

## 2012-08-30 NOTE — Progress Notes (Signed)
[facility]:  Office Visit  Chief Complaint:  Chief Complaint  Patient presents with  . Foot Pain    x 1 week right foot    HPI: Renee Ray is a 71 y.o. female who complains of  5 day history of right heel pain. 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 She has sharp 10/10 pain with minimal to know swelling in the back of her foot with weight bearing. She denies any trauma related with this or prior injuries. She has had a h/o PF. She tried getting gel insoles and that seems to help some. She also is now wearing thicker sole shoes with better support than when this first occurred. She does not feel like this could be her PF. She has taken tylenol. Renee Ray cannot be on any NSAIDs due to her chronic coumadin use for A. Fib.  Past Medical History  Diagnosis Date  . Hypertension   . Hyperlipidemia   . Chronic anticoagulation COUMADIN  . Normal nuclear stress test 2008  . Abnormal echocardiogram 2010    showing slightly enlarged left atrium with normal LV function and normal PA pressures.   . Chronic atrial fibrillation CARDIOLOGIST- DR BRACKBILL-- LAST VISIT  12-16-2011 IN [facility]  . Acute medial meniscal injury of knee RIGHT KNEE  . Asthmatic bronchitis , chronic   . OA (osteoarthritis) of knee HANDS  . History of pneumonia associated w/  rhabdomyolysis  OCT 2012  . COPD, moderate PULMOLOGIST- DR YOUNG --  LAST VISIT NOTE 02-23-2012 IN [facility]  . Bruise RIGHT ABD. DUE TO LOVENOX INJECTION  02-27-2012  . Chronic renal insufficiency   . Diabetes mellitus INSULIN AND ORAL MEDS  . Non-ischemic cardiomyopathy normal ef  . COPD with asthma   . Atrial fibrillation    Past Surgical History  Procedure Date  . Breast enhancement surgery 1970    1990--- BREAST IMPLANTS REMOVED   . Vaginal hysterectomy 1978  . Knee arthroscopy 03/02/2012    Procedure: ARTHROSCOPY KNEE;  Surgeon:  Loanne Drilling, MD;  Location: [facility];  Service: Orthopedics;  Laterality: Right;  WITH DEBRIDEMENT of medial menisces   History   Social History  . Marital Status: Widowed    Spouse Name: N/A    Number of Children: N/A  . Years of Education: N/A   Social History Main Topics  . Smoking status: Former Smoker -- 20 years    Types: Cigarettes    Quit date: 05/05/1996  . Smokeless tobacco: Never Used  . Alcohol Use: No  . Drug Use: No  . Sexually Active: No   Other Topics Concern  . None   Social History Narrative  . None   Family History  Problem Relation Age of Onset  . Arrhythmia Mother   . Heart failure Mother   . Stroke Mother   . Heart attack Father   . Diabetes Brother   . Heart disease Brother    Allergies  Allergen Reactions  . Amoxicillin Diarrhea  . Sulfonamide Derivatives Other (See Comments)    REACTION: thrush   Prior to Admission medications   Medication Sig Start Date End Date Taking? Authorizing Provider  albuterol [facility] HFA) 108 (90 BASE) MCG/ACT inhaler  11/08/11 11/08/12 Yes Waymon Budge, MD  aspirin 81 MG tablet Take 81 mg by mouth daily.    Yes Historical Provider,  MD  atorvastatin (LIPITOR) 80 MG tablet  02/24/12  Yes Cassell Clement, MD  b complex vitamins capsule Take 1 capsule by mouth daily.    Yes Historical Provider, MD  calcium carbonate (OS-CAL) 600 MG TABS Take 600 mg by mouth daily.    Yes Historical Provider, MD  cholecalciferol (VITAMIN D) 1000 UNITS tablet Take 1,000 Units by mouth daily.    Yes Historical Provider, MD  Flaxseed, Linseed, (FLAXSEED OIL PO) Take by mouth daily.    Yes Historical Provider, MD  furosemide (LASIX) 40 MG tablet Take 40 mg by mouth Daily. 03/26/12  Yes Historical Provider, MD  furosemide (LASIX) 40 MG tablet TAKE 1 TABLET BY MOUTH EVERY DAY 07/21/12  Yes Cassell Clement, MD  glucose blood test strip as directed. 02/03/12  Yes Historical Provider, MD  insulin glargine (LANTUS) 100  UNIT/ML injection Inject 30 Units into the skin at bedtime.    Yes Historical Provider, MD  JANUVIA 50 MG tablet Take 50 mg by mouth daily.  08/29/11  Yes Historical Provider, MD  losartan-hydrochlorothiazide (HYZAAR) 100-25 MG per tablet TAKE 1 TABLET BY MOUTH DAILY. 05/27/12  Yes Cassell Clement, MD  Multiple Vitamin (MULTIVITAMIN) tablet Take 1 tablet by mouth daily.    Yes Historical Provider, MD  tiotropium (SPIRIVA) 18 MCG inhalation capsule Place 18 mcg into inhaler and inhale every morning. 10/27/11 10/26/12 Yes Waymon Budge, MD  warfarin (COUMADIN) 5 MG tablet Take by mouth daily. Pt taking 5 mg four times weekly, and 10 mg 3 days weekly   M/w/f 10mg ;   sat, sun, mon, tue 5 mg 11/18/11  Yes Cassell Clement, MD  warfarin (COUMADIN) 5 MG tablet TAKE AS DIRECTED BY ANTICOAGULATION CLINIC 05/08/12  Yes Cassell Clement, MD  valsartan-hydrochlorothiazide (DIOVAN-HCT) 320-25 MG per tablet Take 1 tablet by mouth daily.     Historical Provider, MD     ROS: The patient denies fevers, chills, night sweats, unintentional weight loss, chest pain, palpitations, wheezing, dyspnea on exertion, nausea, vomiting, abdominal pain, dysuria, hematuria, melena, numbness, weakness, or tingling.  All other systems have been reviewed and were otherwise negative with the exception of those mentioned in the HPI and as above.    PHYSICAL EXAM: Filed Vitals:   08/30/12 0946  BP: 142/78  Pulse: 90  Temp: 97.6 F (36.4 C)  Resp: 16   Filed Vitals:   08/30/12 0946  Height: 5' 2.25" (1.581 m)  Weight: 192 lb 6.4 oz (87.272 kg)   Body mass index is 34.91 kg/(m^2).  General: Alert, no acute distress HEENT:  Normocephalic, atraumatic, oropharynx patent.  Cardiovascular:  Irregular  rate and rhythm, no rubs murmurs or gallops.  No Carotid bruits, radial pulse intact. No pedal edema.  Respiratory: Clear to auscultation bilaterally.  No wheezes, rales, or rhonchi.  No cyanosis, no use of accessory  musculature GI: No organomegaly, abdomen is soft and non-tender, positive bowel sounds.  No masses. Skin: No rashes. Neurologic: Facial musculature symmetric. Psychiatric: Patient is appropriate throughout our interaction. Lymphatic: No cervical lymphadenopathy Musculoskeletal: Gait intact. Right ankle-normal exam Right foot-no deformities, no swelling ROM intact, sensation intact, 5/5 strength, + DP and posterior tib artery Tender at posterior calcaneus She has some relief with plantarflexion on exam   LABS: Results for orders placed in visit on 08/23/12  POCT INR      Component Value Range   INR 1.8       EKG/XRAY:   Primary read interpreted by Dr. Conley Rolls at [facility] Right posterior calcaneus  osteophyte/calcification ?  No fracture, subluxaton   ASSESSMENT/PLAN: Encounter Diagnoses  Name Primary?  . Pain of right heel Yes  . Achilles tendinitis   . Heel spur    Achilles tendonopathy There is minimal calcaneus calcification/ subtle osteophyte at the site of traction of the achilles tendon to calcaneus on xray Encourage better foot wear and decrease prolong standing. She can continue with her shoes and also heel gel supports and encourage her to continue with the plantar fasciitis exercises since achilles tendonopathy and PF go hand in hand.  She may benefit more from heel wedges if the gel inserts do not help her. I will let her decide this since she just got the gel supports and used them less than 1 day and they seem to give her some benefit.  Rx Tramadol   LE, THAO PHUONG, DO 09/01/2012 6:27 AM

## 2012-09-01 ENCOUNTER — Other Ambulatory Visit: Payer: Self-pay | Admitting: Internal Medicine

## 2012-09-01 NOTE — Assessment & Plan Note (Signed)
Very well controlled. She continues with pulmonary rehabilitation. No changes to suggest at this time.

## 2012-09-04 ENCOUNTER — Encounter (HOSPITAL_COMMUNITY)
Admission: RE | Admit: 2012-09-04 | Discharge: 2012-09-04 | Disposition: A | Discharge status: Home / Self Care | Payer: Self-pay | Source: Ambulatory Visit | Attending: Internal Medicine | Admitting: Internal Medicine

## 2012-09-06 ENCOUNTER — Ambulatory Visit (INDEPENDENT_AMBULATORY_CARE_PROVIDER_SITE_OTHER): Payer: Medicare Other | Admitting: *Deleted

## 2012-09-06 ENCOUNTER — Ambulatory Visit (INDEPENDENT_AMBULATORY_CARE_PROVIDER_SITE_OTHER): Payer: Medicare Other | Admitting: Cardiology

## 2012-09-06 ENCOUNTER — Encounter: Payer: Self-pay | Admitting: Cardiology

## 2012-09-06 ENCOUNTER — Telehealth: Payer: Self-pay | Admitting: *Deleted

## 2012-09-06 VITALS — BP 113/70 | HR 78 | Resp 18 | Ht 65.0 in | Wt 191.0 lb

## 2012-09-06 DIAGNOSIS — E7800 Pure hypercholesterolemia, unspecified: Secondary | ICD-10-CM

## 2012-09-06 DIAGNOSIS — E785 Hyperlipidemia, unspecified: Secondary | ICD-10-CM

## 2012-09-06 DIAGNOSIS — I4891 Unspecified atrial fibrillation: Secondary | ICD-10-CM

## 2012-09-06 DIAGNOSIS — R899 Unspecified abnormal finding in specimens from other organs, systems and tissues: Secondary | ICD-10-CM

## 2012-09-06 DIAGNOSIS — E876 Hypokalemia: Secondary | ICD-10-CM

## 2012-09-06 DIAGNOSIS — M7730 Calcaneal spur, unspecified foot: Secondary | ICD-10-CM

## 2012-09-06 DIAGNOSIS — R6889 Other general symptoms and signs: Secondary | ICD-10-CM

## 2012-09-06 DIAGNOSIS — I119 Hypertensive heart disease without heart failure: Secondary | ICD-10-CM

## 2012-09-06 LAB — HEPATIC FUNCTION PANEL
Albumin: 3.4 g/dL — ABNORMAL LOW (ref 3.5–5.2)
Total Protein: 7.3 g/dL (ref 6.0–8.3)

## 2012-09-06 LAB — BASIC METABOLIC PANEL
BUN: 23 mg/dL (ref 6–23)
CO2: 30 mEq/L (ref 19–32)
Calcium: 9.2 mg/dL (ref 8.4–10.5)
Creatinine, Ser: 0.8 mg/dL (ref 0.4–1.2)
GFR: 71.8 mL/min (ref >60.00)
Glucose, Bld: 128 mg/dL — ABNORMAL HIGH (ref 70–99)

## 2012-09-06 LAB — LIPID PANEL
Cholesterol: 159 mg/dL (ref 0–200)
HDL: 51.4 mg/dL (ref >39.00)
Triglycerides: 85 mg/dL (ref 0.0–149.0)

## 2012-09-06 MED ORDER — POTASSIUM CHLORIDE CRYS ER 20 MEQ PO TBCR: EXTENDED_RELEASE_TABLET | ORAL | Status: DC

## 2012-09-06 NOTE — Telephone Encounter (Signed)
Pt aware her k+ is low, and needs to take 40 meq today and 20 meq starting tomorrow, bmet in 1 week, pt agreed to plan.

## 2012-09-06 NOTE — Assessment & Plan Note (Signed)
We are checking lab work today. 

## 2012-09-06 NOTE — Patient Instructions (Addendum)
Your physician recommends that you continue on your current medications as directed. Please refer to the Current Medication list given to you today.  Your physician recommends that you schedule a follow-up appointment in: 4 months with fasting labs (lp/bmet/hfp)  

## 2012-09-06 NOTE — Progress Notes (Signed)
Quick Note:  Please report to patient. The recent labs are stable. Continue same medication and careful diet.She has already been notified about the low K and will start on Kdur. BS 128 better ______

## 2012-09-06 NOTE — Progress Notes (Signed)
Renee Ray Date of Birth:  [birth year] [facility] [address] [place], [place]  [address] [phone]  Fax   [phone]  HPI: This pleasant 71 year old woman is seen for a scheduled four-month followup office visit. She has a history of diabetes. She has a history of high blood pressure and a history of established chronic atrial fibrillation. She had a remote attempt at cardioversion which was unsuccessful. She has not been having any symptoms from her atrial fibrillation. He also has a history of asthma and is followed by pulmonary. She participates in the pulmonary rehabilitation program.  Since last visit she has developed a painful right heel spur and has had to suspend temporarily her exercise program.   Current Outpatient Prescriptions  Medication Sig Dispense Refill  . albuterol (PROAIR HFA) 108 (90 BASE) MCG/ACT inhaler       . aspirin 81 MG tablet Take 81 mg by mouth daily.       [name] atorvastatin (LIPITOR) 80 MG tablet       . b complex vitamins capsule Take 1 capsule by mouth daily.       . calcium carbonate (OS-CAL) 600 MG TABS Take 600 mg by mouth daily.       . cholecalciferol (VITAMIN D) 1000 UNITS tablet Take 1,000 Units by mouth daily.       . Flaxseed, Linseed, (FLAXSEED OIL PO) Take by mouth daily.       . furosemide (LASIX) 40 MG tablet TAKE 1 TABLET BY MOUTH EVERY DAY  90 tablet  3  . glucose blood test strip as directed.      . insulin glargine (LANTUS) 100 UNIT/ML injection Inject 30 Units into the skin at bedtime.       [name] JANUVIA 50 MG tablet Take 50 mg by mouth daily.       [name] losartan-hydrochlorothiazide (HYZAAR) 100-25 MG per tablet TAKE 1 TABLET BY MOUTH DAILY.  90 tablet  3  . Multiple Vitamin (MULTIVITAMIN) tablet Take 1 tablet by mouth daily.       [name] PROAIR HFA 108 (90 BASE) MCG/ACT inhaler INHALE 2 PUFFS EVERY 4 HOURS AS NEEDED.  8.5 each  2  . tiotropium (SPIRIVA) 18 MCG inhalation capsule Place 18 mcg into inhaler and inhale  every morning.      . traMADol (ULTRAM) 50 MG tablet Take 1 tablet (50 mg total) by mouth every 8 (eight) hours as needed for pain.  30 tablet  0  . warfarin (COUMADIN) 5 MG tablet Take by mouth daily. Pt taking 5 mg four times weekly, and 10 mg 3 days weekly   M/w/f 10mg ;   sat, sun, mon, tue 5 mg      . DISCONTD: furosemide (LASIX) 40 MG tablet Take 40 mg by mouth Daily.      [name] DISCONTD: warfarin (COUMADIN) 5 MG tablet TAKE AS DIRECTED BY ANTICOAGULATION CLINIC  160 tablet  1    Allergies  Allergen Reactions  . Amoxicillin Diarrhea  . Sulfonamide Derivatives Other (See Comments)    REACTION: thrush    Patient Active Problem List  Diagnosis  . DIABETES MELLITUS  . CARDIOMYOPATHY  . ATRIAL FIBRILLATION  . ASTHMATIC BRONCHITIS, ACUTE  . Asthma with COPD  . Benign hypertensive heart disease without heart failure  . Hyperlipidemia  . Acute medial meniscal tear    History  Smoking status  . Former Smoker -- 20 years  . Types: Cigarettes  . Quit date: 05/05/1996  Smokeless tobacco  .  Never Used    History  Alcohol Use No    Family History  Problem Relation Age of Onset  . Arrhythmia Mother   . Heart failure Mother   . Stroke Mother   . Heart attack Father   . Diabetes Brother   . Heart disease Brother     Review of Systems: The patient denies any heat or cold intolerance.  No weight gain or weight loss.  The patient denies headaches or blurry vision.  There is no cough or sputum production.  The patient denies dizziness.  There is no hematuria or hematochezia.  The patient denies any muscle aches or arthritis.  The patient denies any rash.  The patient denies frequent falling or instability.  There is no history of depression or anxiety.  All other systems were reviewed and are negative.   Physical Exam: Filed Vitals:   09/06/12 0957  BP: 113/70  Pulse: 78  Resp: 18   the general appearance reveals a well-developed well-nourished woman in no distress.The head and  neck exam reveals pupils equal and reactive.  Extraocular movements are full.  There is no scleral icterus.  The mouth and pharynx are normal.  The neck is supple.  The carotids reveal no bruits.  The jugular venous pressure is normal.  The  thyroid is not enlarged.  There is no lymphadenopathy.  The chest is clear to percussion and auscultation.  There are no rales or rhonchi.  Expansion of the chest is symmetrical.  The precordium is quiet.  The pulse is irregular in atrial fibrillation.  The first heart sound is normal.  The second heart sound is physiologically split.  There is no murmur gallop rub or click.  There is no abnormal lift or heave.  The abdomen is soft and nontender.  The bowel sounds are normal.  The liver and spleen are not enlarged.  There are no abdominal masses.  There are no abdominal bruits.  Extremities reveal good pedal pulses.  There is no phlebitis or edema.  There is no cyanosis or clubbing.  Strength is normal and symmetrical in all extremities.  There is no lateralizing weakness.  There are no sensory deficits.  The skin is warm and dry.  There is no rash.     Assessment / Plan: Continue on same medication.  Recheck in 4 months for followup office visit fasting lipid panel hepatic function panel and basal metabolic panel

## 2012-09-06 NOTE — Assessment & Plan Note (Signed)
The patient is on long-term Coumadin.  She has not had any TIA spells.  She is not having any symptoms of CHF or angina pectoris.

## 2012-09-06 NOTE — Assessment & Plan Note (Signed)
The patient has been seen at urgent care for her painful heel spur.  An x-ray was done.  The patient intends to call [facility] orthopedics center and asked to see one of the foot specialist orthopedists.  For the time being she has not been going to pulmonary rehabilitation because she cannot walk on the treadmill properly

## 2012-09-06 NOTE — Assessment & Plan Note (Signed)
Blood pressure is doing well on current therapy.  No dizziness or syncope.

## 2012-09-11 ENCOUNTER — Encounter (HOSPITAL_COMMUNITY)
Admission: RE | Admit: 2012-09-11 | Discharge: 2012-09-11 | Disposition: A | Discharge status: Home / Self Care | Payer: Self-pay | Source: Ambulatory Visit | Attending: Internal Medicine | Admitting: Internal Medicine

## 2012-09-11 DIAGNOSIS — E785 Hyperlipidemia, unspecified: Secondary | ICD-10-CM | POA: Insufficient documentation

## 2012-09-11 DIAGNOSIS — E119 Type 2 diabetes mellitus without complications: Secondary | ICD-10-CM | POA: Insufficient documentation

## 2012-09-11 DIAGNOSIS — J4489 Other specified chronic obstructive pulmonary disease: Secondary | ICD-10-CM | POA: Insufficient documentation

## 2012-09-11 DIAGNOSIS — I4891 Unspecified atrial fibrillation: Secondary | ICD-10-CM | POA: Insufficient documentation

## 2012-09-11 DIAGNOSIS — Z5189 Encounter for other specified aftercare: Secondary | ICD-10-CM | POA: Insufficient documentation

## 2012-09-11 DIAGNOSIS — I119 Hypertensive heart disease without heart failure: Secondary | ICD-10-CM | POA: Insufficient documentation

## 2012-09-11 DIAGNOSIS — J449 Chronic obstructive pulmonary disease, unspecified: Secondary | ICD-10-CM | POA: Insufficient documentation

## 2012-09-11 DIAGNOSIS — I428 Other cardiomyopathies: Secondary | ICD-10-CM | POA: Insufficient documentation

## 2012-09-13 ENCOUNTER — Encounter (HOSPITAL_COMMUNITY)
Admission: RE | Admit: 2012-09-13 | Discharge: 2012-09-13 | Disposition: A | Discharge status: Home / Self Care | Payer: Self-pay | Source: Ambulatory Visit | Attending: Internal Medicine | Admitting: Internal Medicine

## 2012-09-13 ENCOUNTER — Other Ambulatory Visit (INDEPENDENT_AMBULATORY_CARE_PROVIDER_SITE_OTHER): Payer: Medicare Other

## 2012-09-13 ENCOUNTER — Telehealth: Payer: Self-pay | Admitting: *Deleted

## 2012-09-13 DIAGNOSIS — E876 Hypokalemia: Secondary | ICD-10-CM

## 2012-09-13 LAB — BASIC METABOLIC PANEL
Calcium: 9.1 mg/dL (ref 8.4–10.5)
GFR: 69.85 mL/min (ref >60.00)
Potassium: 4.3 mEq/L (ref 3.5–5.1)
Sodium: 140 mEq/L (ref 135–145)

## 2012-09-13 NOTE — Telephone Encounter (Signed)
Message copied by Burnell Blanks on Thu Sep 13, 2012  4:10 PM ------      Message from: Cassell Clement      Created: Thu Sep 13, 2012  3:30 PM       Please report to patient.  The recent labs are stable. Continue same medication and careful diet. K is normal now.

## 2012-09-13 NOTE — Telephone Encounter (Signed)
Advised patient of lab results  

## 2012-09-13 NOTE — Progress Notes (Signed)
Quick Note:  Please report to patient. The recent labs are stable. Continue same medication and careful diet. K+ is normal now. ______ 

## 2012-09-14 NOTE — Telephone Encounter (Signed)
No,  just recheck it at her February visit.

## 2012-09-18 ENCOUNTER — Encounter (HOSPITAL_COMMUNITY)
Admission: RE | Admit: 2012-09-18 | Discharge: 2012-09-18 | Disposition: A | Discharge status: Home / Self Care | Payer: Self-pay | Source: Ambulatory Visit | Attending: Internal Medicine | Admitting: Internal Medicine

## 2012-09-20 ENCOUNTER — Encounter (HOSPITAL_COMMUNITY)
Admission: RE | Admit: 2012-09-20 | Discharge: 2012-09-20 | Disposition: A | Discharge status: Home / Self Care | Payer: Self-pay | Source: Ambulatory Visit | Attending: Internal Medicine | Admitting: Internal Medicine

## 2012-09-25 ENCOUNTER — Encounter (HOSPITAL_COMMUNITY): Payer: Self-pay

## 2012-09-27 ENCOUNTER — Encounter (HOSPITAL_COMMUNITY): Payer: Self-pay

## 2012-10-02 ENCOUNTER — Encounter (HOSPITAL_COMMUNITY)
Admission: RE | Admit: 2012-10-02 | Discharge: 2012-10-02 | Disposition: A | Discharge status: Home / Self Care | Payer: Self-pay | Source: Ambulatory Visit | Attending: Internal Medicine | Admitting: Internal Medicine

## 2012-10-04 ENCOUNTER — Encounter (HOSPITAL_COMMUNITY): Payer: Self-pay

## 2012-10-08 ENCOUNTER — Ambulatory Visit (INDEPENDENT_AMBULATORY_CARE_PROVIDER_SITE_OTHER): Payer: Medicare Other | Admitting: *Deleted

## 2012-10-08 DIAGNOSIS — R6889 Other general symptoms and signs: Secondary | ICD-10-CM

## 2012-10-08 DIAGNOSIS — I4891 Unspecified atrial fibrillation: Secondary | ICD-10-CM

## 2012-10-08 DIAGNOSIS — R899 Unspecified abnormal finding in specimens from other organs, systems and tissues: Secondary | ICD-10-CM

## 2012-10-08 LAB — POCT INR: INR: 3.9

## 2012-10-09 ENCOUNTER — Encounter (HOSPITAL_COMMUNITY)
Admission: RE | Admit: 2012-10-09 | Discharge: 2012-10-09 | Disposition: A | Discharge status: Home / Self Care | Payer: Self-pay | Source: Ambulatory Visit | Attending: Internal Medicine | Admitting: Internal Medicine

## 2012-10-09 DIAGNOSIS — J449 Chronic obstructive pulmonary disease, unspecified: Secondary | ICD-10-CM | POA: Insufficient documentation

## 2012-10-09 DIAGNOSIS — E119 Type 2 diabetes mellitus without complications: Secondary | ICD-10-CM | POA: Insufficient documentation

## 2012-10-09 DIAGNOSIS — I428 Other cardiomyopathies: Secondary | ICD-10-CM | POA: Insufficient documentation

## 2012-10-09 DIAGNOSIS — E785 Hyperlipidemia, unspecified: Secondary | ICD-10-CM | POA: Insufficient documentation

## 2012-10-09 DIAGNOSIS — Z5189 Encounter for other specified aftercare: Secondary | ICD-10-CM | POA: Insufficient documentation

## 2012-10-09 DIAGNOSIS — I119 Hypertensive heart disease without heart failure: Secondary | ICD-10-CM | POA: Insufficient documentation

## 2012-10-09 DIAGNOSIS — I4891 Unspecified atrial fibrillation: Secondary | ICD-10-CM | POA: Insufficient documentation

## 2012-10-09 DIAGNOSIS — J4489 Other specified chronic obstructive pulmonary disease: Secondary | ICD-10-CM | POA: Insufficient documentation

## 2012-10-11 ENCOUNTER — Encounter (HOSPITAL_COMMUNITY)
Admission: RE | Admit: 2012-10-11 | Discharge: 2012-10-11 | Disposition: A | Discharge status: Home / Self Care | Payer: Self-pay | Source: Ambulatory Visit | Attending: Internal Medicine | Admitting: Internal Medicine

## 2012-10-16 ENCOUNTER — Encounter (HOSPITAL_COMMUNITY): Payer: Self-pay

## 2012-10-18 ENCOUNTER — Encounter (HOSPITAL_COMMUNITY)
Admission: RE | Admit: 2012-10-18 | Discharge: 2012-10-18 | Disposition: A | Discharge status: Home / Self Care | Payer: Self-pay | Source: Ambulatory Visit | Attending: Internal Medicine | Admitting: Internal Medicine

## 2012-10-23 ENCOUNTER — Encounter (HOSPITAL_COMMUNITY)
Admission: RE | Admit: 2012-10-23 | Discharge: 2012-10-23 | Disposition: A | Discharge status: Home / Self Care | Payer: Self-pay | Source: Ambulatory Visit | Attending: Internal Medicine | Admitting: Internal Medicine

## 2012-10-23 ENCOUNTER — Other Ambulatory Visit: Payer: Self-pay | Admitting: Family Medicine

## 2012-10-24 ENCOUNTER — Ambulatory Visit (INDEPENDENT_AMBULATORY_CARE_PROVIDER_SITE_OTHER): Payer: Medicare Other | Admitting: Pharmacist

## 2012-10-24 DIAGNOSIS — R6889 Other general symptoms and signs: Secondary | ICD-10-CM

## 2012-10-24 DIAGNOSIS — I4891 Unspecified atrial fibrillation: Secondary | ICD-10-CM

## 2012-10-24 DIAGNOSIS — R899 Unspecified abnormal finding in specimens from other organs, systems and tissues: Secondary | ICD-10-CM

## 2012-10-25 ENCOUNTER — Encounter (HOSPITAL_COMMUNITY): Payer: Self-pay

## 2012-10-30 ENCOUNTER — Encounter (HOSPITAL_COMMUNITY): Payer: Self-pay

## 2012-11-01 ENCOUNTER — Encounter (HOSPITAL_COMMUNITY)
Admission: RE | Admit: 2012-11-01 | Discharge: 2012-11-01 | Disposition: A | Discharge status: Home / Self Care | Payer: Self-pay | Source: Ambulatory Visit | Attending: Internal Medicine | Admitting: Internal Medicine

## 2012-11-01 ENCOUNTER — Other Ambulatory Visit: Payer: Self-pay | Admitting: *Deleted

## 2012-11-01 MED ORDER — TIOTROPIUM BROMIDE MONOHYDRATE 18 MCG IN CAPS: 18.0000 ug | ORAL_CAPSULE | Freq: Every morning | RESPIRATORY_TRACT | Status: DC

## 2012-11-06 ENCOUNTER — Encounter (HOSPITAL_COMMUNITY)
Admission: RE | Admit: 2012-11-06 | Discharge: 2012-11-06 | Disposition: A | Discharge status: Home / Self Care | Payer: Self-pay | Source: Ambulatory Visit | Attending: Internal Medicine | Admitting: Internal Medicine

## 2012-11-08 ENCOUNTER — Ambulatory Visit (INDEPENDENT_AMBULATORY_CARE_PROVIDER_SITE_OTHER): Payer: Medicare Other | Admitting: *Deleted

## 2012-11-08 ENCOUNTER — Encounter (HOSPITAL_COMMUNITY)
Admission: RE | Admit: 2012-11-08 | Discharge: 2012-11-08 | Disposition: A | Discharge status: Home / Self Care | Payer: Self-pay | Source: Ambulatory Visit | Attending: Internal Medicine | Admitting: Internal Medicine

## 2012-11-08 DIAGNOSIS — I4891 Unspecified atrial fibrillation: Secondary | ICD-10-CM | POA: Insufficient documentation

## 2012-11-08 DIAGNOSIS — J449 Chronic obstructive pulmonary disease, unspecified: Secondary | ICD-10-CM | POA: Insufficient documentation

## 2012-11-08 DIAGNOSIS — I428 Other cardiomyopathies: Secondary | ICD-10-CM | POA: Insufficient documentation

## 2012-11-08 DIAGNOSIS — Z5189 Encounter for other specified aftercare: Secondary | ICD-10-CM | POA: Insufficient documentation

## 2012-11-08 DIAGNOSIS — J4489 Other specified chronic obstructive pulmonary disease: Secondary | ICD-10-CM | POA: Insufficient documentation

## 2012-11-08 DIAGNOSIS — I119 Hypertensive heart disease without heart failure: Secondary | ICD-10-CM | POA: Insufficient documentation

## 2012-11-08 DIAGNOSIS — E785 Hyperlipidemia, unspecified: Secondary | ICD-10-CM | POA: Insufficient documentation

## 2012-11-08 DIAGNOSIS — R6889 Other general symptoms and signs: Secondary | ICD-10-CM

## 2012-11-08 DIAGNOSIS — R899 Unspecified abnormal finding in specimens from other organs, systems and tissues: Secondary | ICD-10-CM

## 2012-11-08 DIAGNOSIS — E119 Type 2 diabetes mellitus without complications: Secondary | ICD-10-CM | POA: Insufficient documentation

## 2012-11-08 LAB — POCT INR: INR: 2.2

## 2012-11-13 ENCOUNTER — Encounter (HOSPITAL_COMMUNITY): Payer: Self-pay

## 2012-11-15 ENCOUNTER — Encounter (HOSPITAL_COMMUNITY)
Admission: RE | Admit: 2012-11-15 | Discharge: 2012-11-15 | Disposition: A | Discharge status: Home / Self Care | Payer: Self-pay | Source: Ambulatory Visit | Attending: Internal Medicine | Admitting: Internal Medicine

## 2012-11-20 ENCOUNTER — Encounter (HOSPITAL_COMMUNITY): Payer: Self-pay

## 2012-11-22 ENCOUNTER — Encounter (HOSPITAL_COMMUNITY): Payer: Self-pay

## 2012-11-27 ENCOUNTER — Ambulatory Visit (INDEPENDENT_AMBULATORY_CARE_PROVIDER_SITE_OTHER): Payer: Medicare Other | Admitting: *Deleted

## 2012-11-27 ENCOUNTER — Encounter (HOSPITAL_COMMUNITY)
Admission: RE | Admit: 2012-11-27 | Discharge: 2012-11-27 | Disposition: A | Discharge status: Home / Self Care | Payer: Self-pay | Source: Ambulatory Visit | Attending: Internal Medicine | Admitting: Internal Medicine

## 2012-11-27 DIAGNOSIS — I4891 Unspecified atrial fibrillation: Secondary | ICD-10-CM

## 2012-11-27 DIAGNOSIS — R899 Unspecified abnormal finding in specimens from other organs, systems and tissues: Secondary | ICD-10-CM

## 2012-11-27 DIAGNOSIS — R6889 Other general symptoms and signs: Secondary | ICD-10-CM

## 2012-11-27 LAB — POCT INR: INR: 2.4

## 2012-11-29 ENCOUNTER — Encounter (HOSPITAL_COMMUNITY)
Admission: RE | Admit: 2012-11-29 | Discharge: 2012-11-29 | Disposition: A | Discharge status: Home / Self Care | Payer: Self-pay | Source: Ambulatory Visit | Attending: Internal Medicine | Admitting: Internal Medicine

## 2012-12-04 ENCOUNTER — Encounter (HOSPITAL_COMMUNITY)
Admission: RE | Admit: 2012-12-04 | Discharge: 2012-12-04 | Disposition: A | Discharge status: Home / Self Care | Payer: Self-pay | Source: Ambulatory Visit | Attending: Internal Medicine | Admitting: Internal Medicine

## 2012-12-06 ENCOUNTER — Encounter (HOSPITAL_COMMUNITY): Payer: Self-pay

## 2012-12-11 ENCOUNTER — Encounter (HOSPITAL_COMMUNITY): Payer: Self-pay

## 2012-12-11 DIAGNOSIS — I428 Other cardiomyopathies: Secondary | ICD-10-CM | POA: Insufficient documentation

## 2012-12-11 DIAGNOSIS — I4891 Unspecified atrial fibrillation: Secondary | ICD-10-CM | POA: Insufficient documentation

## 2012-12-11 DIAGNOSIS — Z5189 Encounter for other specified aftercare: Secondary | ICD-10-CM | POA: Insufficient documentation

## 2012-12-11 DIAGNOSIS — J449 Chronic obstructive pulmonary disease, unspecified: Secondary | ICD-10-CM | POA: Insufficient documentation

## 2012-12-11 DIAGNOSIS — I119 Hypertensive heart disease without heart failure: Secondary | ICD-10-CM | POA: Insufficient documentation

## 2012-12-11 DIAGNOSIS — J4489 Other specified chronic obstructive pulmonary disease: Secondary | ICD-10-CM | POA: Insufficient documentation

## 2012-12-11 DIAGNOSIS — E119 Type 2 diabetes mellitus without complications: Secondary | ICD-10-CM | POA: Insufficient documentation

## 2012-12-11 DIAGNOSIS — E785 Hyperlipidemia, unspecified: Secondary | ICD-10-CM | POA: Insufficient documentation

## 2012-12-13 ENCOUNTER — Other Ambulatory Visit: Payer: Self-pay | Admitting: Internal Medicine

## 2012-12-13 ENCOUNTER — Encounter (HOSPITAL_COMMUNITY)
Admission: RE | Admit: 2012-12-13 | Discharge: 2012-12-13 | Disposition: A | Discharge status: Home / Self Care | Payer: Self-pay | Source: Ambulatory Visit | Attending: Internal Medicine | Admitting: Internal Medicine

## 2012-12-17 ENCOUNTER — Other Ambulatory Visit: Payer: Self-pay | Admitting: *Deleted

## 2012-12-17 MED ORDER — WARFARIN SODIUM 5 MG PO TABS: 5.0000 mg | ORAL_TABLET | ORAL | Status: DC

## 2012-12-18 ENCOUNTER — Encounter (HOSPITAL_COMMUNITY)
Admission: RE | Admit: 2012-12-18 | Discharge: 2012-12-18 | Disposition: A | Discharge status: Home / Self Care | Payer: Self-pay | Source: Ambulatory Visit | Attending: Internal Medicine | Admitting: Internal Medicine

## 2012-12-18 IMAGING — CR DG CHEST 2V
2 series · 2 of 2 positions shown · non-contrast
Comparison: Chest radiograph 09/04/2008

CLINICAL DATA: Wheezing

CHEST - 2 VIEW

[w chest lat]
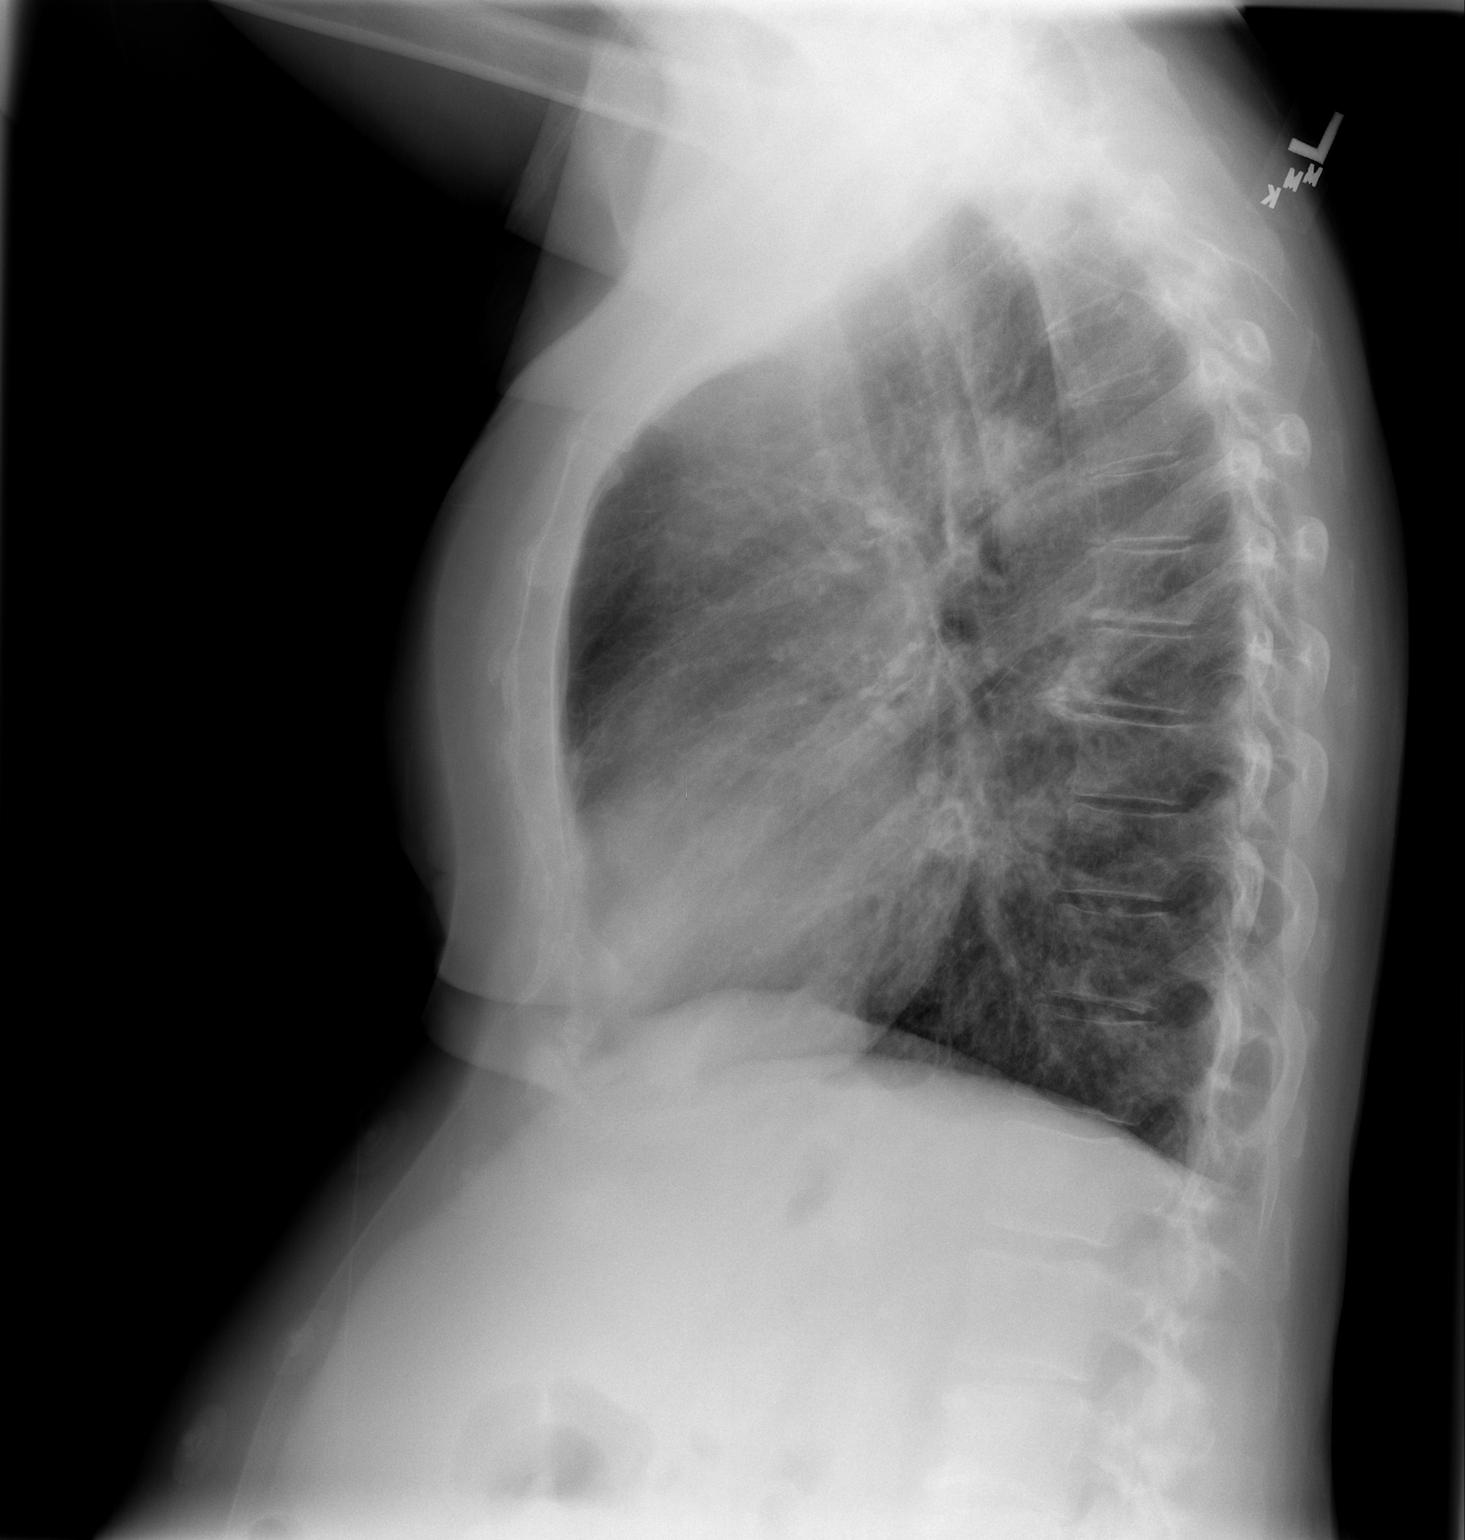

[w chest pa]
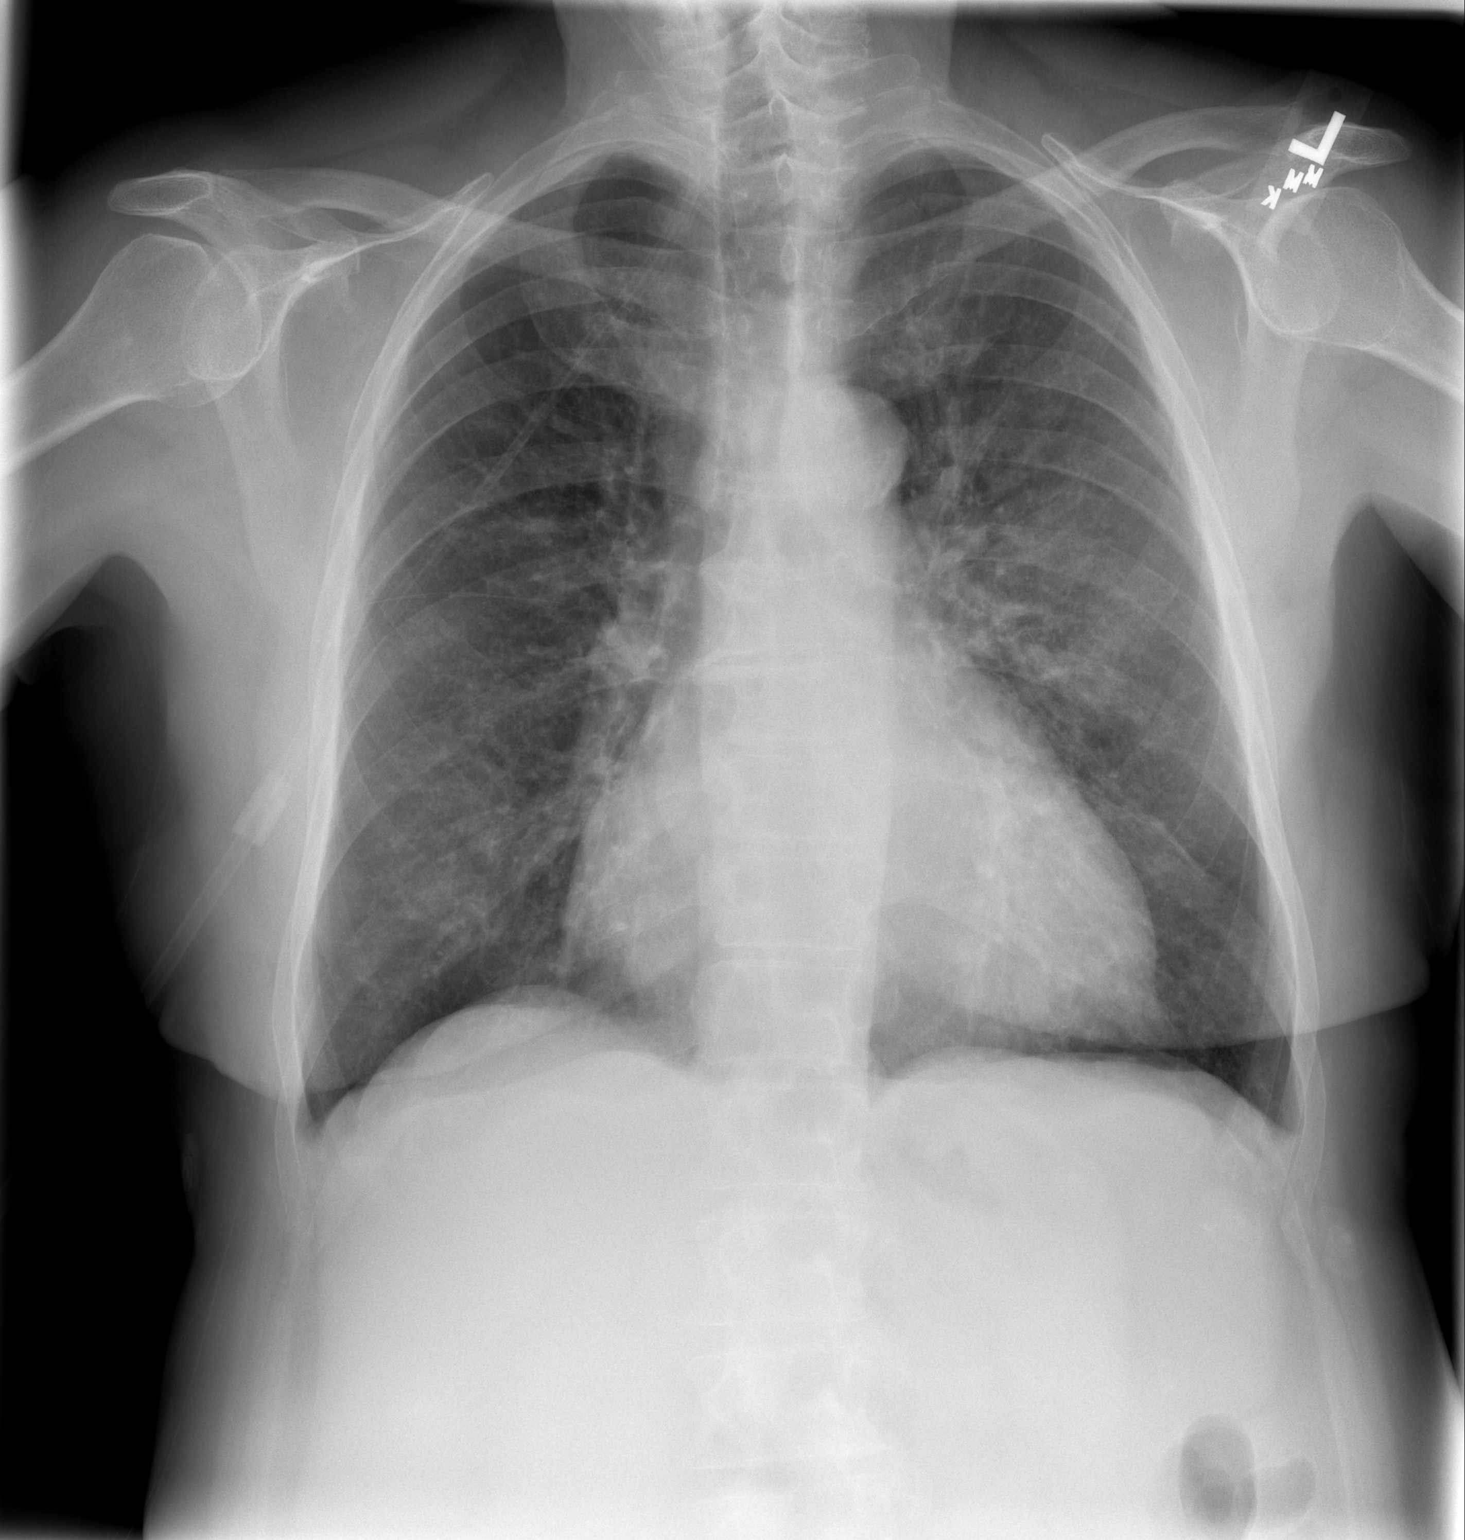

[2 of 2 positions shown; findings below may reference images not displayed]

FINDINGS: None stable mildly enlarged heart silhouette.  There is
coarsening of the central bronchovascular markings.  There is some
focal air space disease in the left upper lobe.  No pleural fluid.
No pneumothorax.  No acute bony abnormality.
IMPRESSION: Left upper lobe pneumonia superimposed on bronchitis, viral process
or reactive airway disease.

## 2012-12-20 ENCOUNTER — Encounter (HOSPITAL_COMMUNITY): Payer: Self-pay

## 2012-12-25 ENCOUNTER — Encounter (HOSPITAL_COMMUNITY)
Admission: RE | Admit: 2012-12-25 | Discharge: 2012-12-25 | Disposition: A | Discharge status: Home / Self Care | Payer: Self-pay | Source: Ambulatory Visit | Attending: Internal Medicine | Admitting: Internal Medicine

## 2012-12-27 ENCOUNTER — Encounter (HOSPITAL_COMMUNITY)
Admission: RE | Admit: 2012-12-27 | Discharge: 2012-12-27 | Disposition: A | Discharge status: Home / Self Care | Payer: Self-pay | Source: Ambulatory Visit | Attending: Internal Medicine | Admitting: Internal Medicine

## 2013-01-01 ENCOUNTER — Encounter (HOSPITAL_COMMUNITY): Payer: Self-pay

## 2013-01-03 ENCOUNTER — Ambulatory Visit (INDEPENDENT_AMBULATORY_CARE_PROVIDER_SITE_OTHER): Payer: Medicare Other | Admitting: Cardiology

## 2013-01-03 ENCOUNTER — Encounter: Payer: Self-pay | Admitting: Cardiology

## 2013-01-03 ENCOUNTER — Encounter (HOSPITAL_COMMUNITY): Payer: Self-pay

## 2013-01-03 ENCOUNTER — Other Ambulatory Visit (INDEPENDENT_AMBULATORY_CARE_PROVIDER_SITE_OTHER): Payer: Medicare Other

## 2013-01-03 ENCOUNTER — Ambulatory Visit (INDEPENDENT_AMBULATORY_CARE_PROVIDER_SITE_OTHER): Payer: Medicare Other

## 2013-01-03 VITALS — BP 128/76 | HR 88 | Ht 64.0 in | Wt 199.1 lb

## 2013-01-03 DIAGNOSIS — I119 Hypertensive heart disease without heart failure: Secondary | ICD-10-CM

## 2013-01-03 DIAGNOSIS — E785 Hyperlipidemia, unspecified: Secondary | ICD-10-CM

## 2013-01-03 DIAGNOSIS — I4891 Unspecified atrial fibrillation: Secondary | ICD-10-CM

## 2013-01-03 LAB — LIPID PANEL
Cholesterol: 162 mg/dL (ref 0–200)
HDL: 54.2 mg/dL (ref >39.00)
VLDL: 20.2 mg/dL (ref 0.0–40.0)

## 2013-01-03 LAB — BASIC METABOLIC PANEL
Chloride: 102 mEq/L (ref 96–112)
GFR: 66.19 mL/min (ref >60.00)
Potassium: 3.6 mEq/L (ref 3.5–5.1)
Sodium: 140 mEq/L (ref 135–145)

## 2013-01-03 LAB — POCT INR: INR: 3.3

## 2013-01-03 LAB — HEPATIC FUNCTION PANEL
AST: 38 U/L — ABNORMAL HIGH (ref 0–37)
Albumin: 3.6 g/dL (ref 3.5–5.2)
Total Protein: 7.2 g/dL (ref 6.0–8.3)

## 2013-01-03 MED ORDER — POTASSIUM CHLORIDE CRYS ER 20 MEQ PO TBCR: EXTENDED_RELEASE_TABLET | ORAL | Status: DC

## 2013-01-03 NOTE — Progress Notes (Signed)
Renee Ray Date of Birth:  1941-01-27 [facility] [address] [place], [place]  [address] [phone]  Fax   [phone]  HPI: This pleasant 72 year old woman is seen for a scheduled four-month followup office visit. She has a history of diabetes.  Her diabetes is followed by [name]. She has a history of high blood pressure and a history of established chronic atrial fibrillation. She had a remote attempt at cardioversion which was unsuccessful. She has not been having any symptoms from her atrial fibrillation. He also has a history of asthma and is followed by pulmonary. She participates in the pulmonary rehabilitation program. Since last visit she has developed a painful right heel spur and has had to suspend temporarily her exercise program.  She has had acupuncture which has helped the heel spur to some degree.   Current Outpatient Prescriptions  Medication Sig Dispense Refill  . potassium chloride SA (K-DUR,KLOR-CON) 20 MEQ tablet Take 40 meq ( 2 tablets) today then take 20 meq daily.  32 tablet  6  . albuterol (PROAIR HFA) 108 (90 BASE) MCG/ACT inhaler       . aspirin 81 MG tablet Take 81 mg by mouth daily.       [name] atorvastatin (LIPITOR) 80 MG tablet Take 80 mg by mouth daily.       [name] b complex vitamins capsule Take 1 capsule by mouth daily.       . calcium carbonate (OS-CAL) 600 MG TABS Take 600 mg by mouth daily.       . cholecalciferol (VITAMIN D) 1000 UNITS tablet Take 1,000 Units by mouth daily.       . Flaxseed, Linseed, (FLAXSEED OIL PO) Take by mouth daily.       . furosemide (LASIX) 40 MG tablet TAKE 1 TABLET BY MOUTH EVERY DAY  90 tablet  3  . glucose blood test strip as directed.      . insulin glargine (LANTUS) 100 UNIT/ML injection Inject 30 Units into the skin at bedtime.       [name] JANUVIA 50 MG tablet Take 50 mg by mouth daily.       [name] losartan-hydrochlorothiazide (HYZAAR) 100-25 MG per tablet TAKE 1 TABLET BY MOUTH DAILY.  90 tablet  3    . Multiple Vitamin (MULTIVITAMIN) tablet Take 1 tablet by mouth daily.       [name] PROAIR HFA 108 (90 BASE) MCG/ACT inhaler INHALE 2 PUFFS EVERY 4 HOURS AS NEEDED.  8.5 each  2  . tiotropium (SPIRIVA) 18 MCG inhalation capsule Place 1 capsule (18 mcg total) into inhaler and inhale every morning.  30 capsule  5  . traMADol (ULTRAM) 50 MG tablet TAKE 1 TABLET EVERY 8 HOURS AS NEEDED FOR PAIN  30 tablet  0  . warfarin (COUMADIN) 5 MG tablet Take 1 tablet (5 mg total) by mouth as directed.  120 tablet  1   No current facility-administered medications for this visit.    Allergies  Allergen Reactions  . Amoxicillin Diarrhea  . Sulfonamide Derivatives Other (See Comments)    REACTION: thrush    Patient Active Problem List  Diagnosis  . DIABETES MELLITUS  . CARDIOMYOPATHY  . ATRIAL FIBRILLATION  . ASTHMATIC BRONCHITIS, ACUTE  . Asthma with COPD  . Benign hypertensive heart disease without heart failure  . Hyperlipidemia  . Acute medial meniscal tear  . Heel spur    History  Smoking status  . Former Smoker -- 20 years  .  Types: Cigarettes  . Quit date: 05/05/1996  Smokeless tobacco  . Never Used    History  Alcohol Use No    Family History  Problem Relation Age of Onset  . Arrhythmia Mother   . Heart failure Mother   . Stroke Mother   . Heart attack Father   . Diabetes Brother   . Heart disease Brother     Review of Systems: The patient denies any heat or cold intolerance.  No weight gain or weight loss.  The patient denies headaches or blurry vision.  There is no cough or sputum production.  The patient denies dizziness.  There is no hematuria or hematochezia.  The patient denies any muscle aches or arthritis.  The patient denies any rash.  The patient denies frequent falling or instability.  There is no history of depression or anxiety.  All other systems were reviewed and are negative.   Physical Exam: Filed Vitals:   01/03/13 0939  BP: 128/76  Pulse: 88   the  general appearance reveals a well-developed well-nourished woman in no distress.The head and neck exam reveals pupils equal and reactive.  Extraocular movements are full.  There is no scleral icterus.  The mouth and pharynx are normal.  The neck is supple.  The carotids reveal no bruits.  The jugular venous pressure is normal.  The  thyroid is not enlarged.  There is no lymphadenopathy.  The chest is clear to percussion and auscultation.  There are no rales or rhonchi.  Expansion of the chest is symmetrical.  The precordium is quiet.  The first heart sound is normal.  The second heart sound is physiologically split.  There is no murmur gallop rub or click.  There is no abnormal lift or heave.  The abdomen is soft and nontender.  The bowel sounds are normal.  The liver and spleen are not enlarged.  There are no abdominal masses.  There are no abdominal bruits.  Extremities reveal good pedal pulses.  There is no phlebitis or edema.  There is no cyanosis or clubbing.  Strength is normal and symmetrical in all extremities.  There is no lateralizing weakness.  There are no sensory deficits.  The skin is warm and dry.  There is no rash.      Assessment / Plan: Continue on same medication.  Work harder on diet.  Continue regular walking exercise.  She goes to the pulmonary rehabilitation program twice a week.  Recheck in 4 months for followup office visit lipid panel hepatic function panel and basal metabolic panel

## 2013-01-03 NOTE — Assessment & Plan Note (Signed)
Blood pressure was remaining stable on current therapy.  No dizziness or syncope.  No headaches 

## 2013-01-03 NOTE — Assessment & Plan Note (Signed)
The patient has a history of hyperlipidemia.  She has not been as careful with her diet.  She has gained 8 pounds since last visit.  She will work harder on losing weight particularly by avoiding carbohydrates

## 2013-01-03 NOTE — Assessment & Plan Note (Signed)
The patient has not had any TIA symptoms.  No symptoms of CHF.

## 2013-01-03 NOTE — Patient Instructions (Addendum)
Your physician recommends that you continue on your current medications as directed. Please refer to the Current Medication list given to you today.  Your physician recommends that you schedule a follow-up appointment in: 4 months with fasting labs (lp/bmet/hfp)  

## 2013-01-03 NOTE — Assessment & Plan Note (Signed)
These symptoms are gradually improving.  She was doing exercises and is getting acupuncture

## 2013-01-08 ENCOUNTER — Encounter (HOSPITAL_COMMUNITY)
Admission: RE | Admit: 2013-01-08 | Discharge: 2013-01-08 | Disposition: A | Discharge status: Home / Self Care | Payer: Self-pay | Source: Ambulatory Visit | Attending: Internal Medicine | Admitting: Internal Medicine

## 2013-01-08 DIAGNOSIS — I428 Other cardiomyopathies: Secondary | ICD-10-CM | POA: Insufficient documentation

## 2013-01-08 DIAGNOSIS — E119 Type 2 diabetes mellitus without complications: Secondary | ICD-10-CM | POA: Insufficient documentation

## 2013-01-08 DIAGNOSIS — I119 Hypertensive heart disease without heart failure: Secondary | ICD-10-CM | POA: Insufficient documentation

## 2013-01-08 DIAGNOSIS — I4891 Unspecified atrial fibrillation: Secondary | ICD-10-CM | POA: Insufficient documentation

## 2013-01-08 DIAGNOSIS — J4489 Other specified chronic obstructive pulmonary disease: Secondary | ICD-10-CM | POA: Insufficient documentation

## 2013-01-08 DIAGNOSIS — Z5189 Encounter for other specified aftercare: Secondary | ICD-10-CM | POA: Insufficient documentation

## 2013-01-08 DIAGNOSIS — E785 Hyperlipidemia, unspecified: Secondary | ICD-10-CM | POA: Insufficient documentation

## 2013-01-10 ENCOUNTER — Encounter (HOSPITAL_COMMUNITY): Payer: Self-pay

## 2013-01-15 ENCOUNTER — Encounter (HOSPITAL_COMMUNITY): Payer: Self-pay

## 2013-01-17 ENCOUNTER — Encounter (HOSPITAL_COMMUNITY): Payer: Self-pay

## 2013-01-22 ENCOUNTER — Encounter (HOSPITAL_COMMUNITY): Payer: Self-pay

## 2013-01-24 ENCOUNTER — Encounter (HOSPITAL_COMMUNITY): Payer: Self-pay

## 2013-01-29 ENCOUNTER — Encounter (HOSPITAL_COMMUNITY)
Admission: RE | Admit: 2013-01-29 | Discharge: 2013-01-29 | Disposition: A | Discharge status: Home / Self Care | Payer: Self-pay | Source: Ambulatory Visit | Attending: Internal Medicine | Admitting: Internal Medicine

## 2013-01-31 ENCOUNTER — Ambulatory Visit (INDEPENDENT_AMBULATORY_CARE_PROVIDER_SITE_OTHER): Payer: Medicare Other

## 2013-01-31 ENCOUNTER — Encounter (HOSPITAL_COMMUNITY): Payer: Self-pay

## 2013-01-31 DIAGNOSIS — R6889 Other general symptoms and signs: Secondary | ICD-10-CM

## 2013-01-31 DIAGNOSIS — I4891 Unspecified atrial fibrillation: Secondary | ICD-10-CM

## 2013-01-31 DIAGNOSIS — R899 Unspecified abnormal finding in specimens from other organs, systems and tissues: Secondary | ICD-10-CM

## 2013-01-31 LAB — POCT INR: INR: 3.2

## 2013-02-05 ENCOUNTER — Encounter (HOSPITAL_COMMUNITY)
Admission: RE | Admit: 2013-02-05 | Discharge: 2013-02-05 | Disposition: A | Discharge status: Home / Self Care | Payer: Self-pay | Source: Ambulatory Visit | Attending: Internal Medicine | Admitting: Internal Medicine

## 2013-02-05 DIAGNOSIS — I4891 Unspecified atrial fibrillation: Secondary | ICD-10-CM | POA: Insufficient documentation

## 2013-02-05 DIAGNOSIS — J4489 Other specified chronic obstructive pulmonary disease: Secondary | ICD-10-CM | POA: Insufficient documentation

## 2013-02-05 DIAGNOSIS — E785 Hyperlipidemia, unspecified: Secondary | ICD-10-CM | POA: Insufficient documentation

## 2013-02-05 DIAGNOSIS — I428 Other cardiomyopathies: Secondary | ICD-10-CM | POA: Insufficient documentation

## 2013-02-05 DIAGNOSIS — Z5189 Encounter for other specified aftercare: Secondary | ICD-10-CM | POA: Insufficient documentation

## 2013-02-05 DIAGNOSIS — I119 Hypertensive heart disease without heart failure: Secondary | ICD-10-CM | POA: Insufficient documentation

## 2013-02-05 DIAGNOSIS — E119 Type 2 diabetes mellitus without complications: Secondary | ICD-10-CM | POA: Insufficient documentation

## 2013-02-05 DIAGNOSIS — J449 Chronic obstructive pulmonary disease, unspecified: Secondary | ICD-10-CM | POA: Insufficient documentation

## 2013-02-07 ENCOUNTER — Encounter (HOSPITAL_COMMUNITY)
Admission: RE | Admit: 2013-02-07 | Discharge: 2013-02-07 | Disposition: A | Discharge status: Home / Self Care | Payer: Self-pay | Source: Ambulatory Visit | Attending: Internal Medicine | Admitting: Internal Medicine

## 2013-02-12 ENCOUNTER — Encounter (HOSPITAL_COMMUNITY)
Admission: RE | Admit: 2013-02-12 | Discharge: 2013-02-12 | Disposition: A | Discharge status: Home / Self Care | Payer: Self-pay | Source: Ambulatory Visit | Attending: Internal Medicine | Admitting: Internal Medicine

## 2013-02-14 ENCOUNTER — Encounter (HOSPITAL_COMMUNITY): Payer: Self-pay

## 2013-02-19 ENCOUNTER — Encounter (HOSPITAL_COMMUNITY)
Admission: RE | Admit: 2013-02-19 | Discharge: 2013-02-19 | Disposition: A | Discharge status: Home / Self Care | Payer: Self-pay | Source: Ambulatory Visit | Attending: Internal Medicine | Admitting: Internal Medicine

## 2013-02-21 ENCOUNTER — Ambulatory Visit (INDEPENDENT_AMBULATORY_CARE_PROVIDER_SITE_OTHER): Payer: Medicare Other | Admitting: *Deleted

## 2013-02-21 ENCOUNTER — Encounter (HOSPITAL_COMMUNITY)
Admission: RE | Admit: 2013-02-21 | Discharge: 2013-02-21 | Disposition: A | Discharge status: Home / Self Care | Payer: Self-pay | Source: Ambulatory Visit | Attending: Internal Medicine | Admitting: Internal Medicine

## 2013-02-21 DIAGNOSIS — R899 Unspecified abnormal finding in specimens from other organs, systems and tissues: Secondary | ICD-10-CM

## 2013-02-21 DIAGNOSIS — I4891 Unspecified atrial fibrillation: Secondary | ICD-10-CM

## 2013-02-21 DIAGNOSIS — R6889 Other general symptoms and signs: Secondary | ICD-10-CM

## 2013-02-21 LAB — POCT INR: INR: 2.3

## 2013-02-23 ENCOUNTER — Emergency Department (HOSPITAL_COMMUNITY)
Admission: EM | Admit: 2013-02-23 | Discharge: 2013-02-23 | Disposition: A | Discharge status: Home / Self Care | Payer: Worker's Compensation | Attending: Emergency Medicine | Admitting: Emergency Medicine

## 2013-02-23 ENCOUNTER — Encounter (HOSPITAL_COMMUNITY): Payer: Self-pay | Admitting: Physical Medicine and Rehabilitation

## 2013-02-23 ENCOUNTER — Emergency Department (HOSPITAL_COMMUNITY): Payer: Worker's Compensation

## 2013-02-23 DIAGNOSIS — T45515A Adverse effect of anticoagulants, initial encounter: Secondary | ICD-10-CM | POA: Insufficient documentation

## 2013-02-23 DIAGNOSIS — S8001XA Contusion of right knee, initial encounter: Secondary | ICD-10-CM

## 2013-02-23 DIAGNOSIS — I129 Hypertensive chronic kidney disease with stage 1 through stage 4 chronic kidney disease, or unspecified chronic kidney disease: Secondary | ICD-10-CM | POA: Insufficient documentation

## 2013-02-23 DIAGNOSIS — D684 Acquired coagulation factor deficiency: Secondary | ICD-10-CM | POA: Insufficient documentation

## 2013-02-23 DIAGNOSIS — Z8679 Personal history of other diseases of the circulatory system: Secondary | ICD-10-CM | POA: Insufficient documentation

## 2013-02-23 DIAGNOSIS — M1710 Unilateral primary osteoarthritis, unspecified knee: Secondary | ICD-10-CM | POA: Insufficient documentation

## 2013-02-23 DIAGNOSIS — E119 Type 2 diabetes mellitus without complications: Secondary | ICD-10-CM | POA: Insufficient documentation

## 2013-02-23 DIAGNOSIS — Z87891 Personal history of nicotine dependence: Secondary | ICD-10-CM | POA: Insufficient documentation

## 2013-02-23 DIAGNOSIS — IMO0002 Reserved for concepts with insufficient information to code with codable children: Secondary | ICD-10-CM | POA: Insufficient documentation

## 2013-02-23 DIAGNOSIS — S0990XA Unspecified injury of head, initial encounter: Secondary | ICD-10-CM | POA: Insufficient documentation

## 2013-02-23 DIAGNOSIS — Y9389 Activity, other specified: Secondary | ICD-10-CM | POA: Insufficient documentation

## 2013-02-23 DIAGNOSIS — Y929 Unspecified place or not applicable: Secondary | ICD-10-CM | POA: Insufficient documentation

## 2013-02-23 DIAGNOSIS — Z79899 Other long term (current) drug therapy: Secondary | ICD-10-CM | POA: Insufficient documentation

## 2013-02-23 DIAGNOSIS — I4891 Unspecified atrial fibrillation: Secondary | ICD-10-CM | POA: Insufficient documentation

## 2013-02-23 DIAGNOSIS — E785 Hyperlipidemia, unspecified: Secondary | ICD-10-CM | POA: Insufficient documentation

## 2013-02-23 DIAGNOSIS — S0100XA Unspecified open wound of scalp, initial encounter: Secondary | ICD-10-CM | POA: Insufficient documentation

## 2013-02-23 DIAGNOSIS — S0101XA Laceration without foreign body of scalp, initial encounter: Secondary | ICD-10-CM

## 2013-02-23 DIAGNOSIS — W108XXA Fall (on) (from) other stairs and steps, initial encounter: Secondary | ICD-10-CM | POA: Insufficient documentation

## 2013-02-23 DIAGNOSIS — Z87828 Personal history of other (healed) physical injury and trauma: Secondary | ICD-10-CM | POA: Insufficient documentation

## 2013-02-23 DIAGNOSIS — Z7982 Long term (current) use of aspirin: Secondary | ICD-10-CM | POA: Insufficient documentation

## 2013-02-23 DIAGNOSIS — J449 Chronic obstructive pulmonary disease, unspecified: Secondary | ICD-10-CM | POA: Insufficient documentation

## 2013-02-23 DIAGNOSIS — S8000XA Contusion of unspecified knee, initial encounter: Secondary | ICD-10-CM | POA: Insufficient documentation

## 2013-02-23 DIAGNOSIS — J4489 Other specified chronic obstructive pulmonary disease: Secondary | ICD-10-CM | POA: Insufficient documentation

## 2013-02-23 DIAGNOSIS — Z794 Long term (current) use of insulin: Secondary | ICD-10-CM | POA: Insufficient documentation

## 2013-02-23 DIAGNOSIS — Z7901 Long term (current) use of anticoagulants: Secondary | ICD-10-CM | POA: Insufficient documentation

## 2013-02-23 DIAGNOSIS — Z8701 Personal history of pneumonia (recurrent): Secondary | ICD-10-CM | POA: Insufficient documentation

## 2013-02-23 NOTE — ED Notes (Signed)
Pt alert and oriented upon d/c. NAD noted upon d/c. Pt given d/c teaching and warning symptom education. Pt verbalizes understanding and has no further questions upon d/c. Pt ambulatory upon d/c. Pt leaving with friend from work and leaving with d/c paperwork.

## 2013-02-23 NOTE — ED Notes (Signed)
Dr. Bednar at bedside. 

## 2013-02-23 NOTE — ED Notes (Signed)
Pt presents to department for evaluation of fall. States she was walking up stairs and fell. Struck head on concrete pole. 2cm laceration noted to R scalp. Does take coumadin. Pt is alert and can answer questions appropriately. Denies dizziness and blurred vision. Bleeding controlled upon arrival.

## 2013-02-23 NOTE — ED Provider Notes (Signed)
History     CSN: [id number]  Arrival date & time 02/23/13  1257   First MD Initiated Contact with Patient 02/23/13 1308      Chief Complaint  Patient presents with  . Fall  . Head Injury    (Consider location/radiation/quality/duration/timing/severity/associated sxs/prior treatment) HPI 72 year old female takes Coumadin for chronic atrial fibrillation, she accidentally tripped and fell when walking up two stairs today and hit her head on a post resulting in a small laceration to her right scalp as well as bumping her right knee over the kneecap on the ground causing a small abrasion to her knee and some pain to the right anterior knee, this fall occurred just prior to arrival, she is no headache no amnesia no loss of consciousness no altered mental status no change in speech vision swallowing or understanding also no weakness numbness or incoordination she also is no neck pain back pain chest pain shortness breath abdominal pain and no extremity pain other than the right anterior knee over the patella only and she is able to walk since this fall which occurred today just prior to arrival. There is no treatment prior to arrival. Her INR was 2.3 within the last few days, her last tetanus shot was within the last 10 years. There is no active bleeding to her right parietal scalp laceration and the bleeding stopped with local pressure prior to arrival. Past Medical History  Diagnosis Date  . Hypertension   . Hyperlipidemia   . Chronic anticoagulation COUMADIN  . Normal nuclear stress test 2008  . Abnormal echocardiogram 2010    showing slightly enlarged left atrium with normal LV function and normal PA pressures.   . Chronic atrial fibrillation CARDIOLOGIST- DR BRACKBILL-- LAST VISIT  12-16-2011 IN [facility]  . Acute medial meniscal injury of knee RIGHT KNEE  . Asthmatic bronchitis , chronic   . OA (osteoarthritis) of knee HANDS  . History of pneumonia associated w/  rhabdomyolysis  OCT 2012  .  COPD, moderate PULMOLOGIST- DR YOUNG --  LAST VISIT NOTE 02-23-2012 IN [facility]  . Bruise RIGHT ABD. DUE TO LOVENOX INJECTION  02-27-2012  . Chronic renal insufficiency   . Diabetes mellitus INSULIN AND ORAL MEDS  . Non-ischemic cardiomyopathy normal ef  . COPD with asthma   . Atrial fibrillation     Past Surgical History  Procedure Laterality Date  . Breast enhancement surgery  1970    1990--- BREAST IMPLANTS REMOVED   . Vaginal hysterectomy  1978  . Knee arthroscopy  03/02/2012    Procedure: ARTHROSCOPY KNEE;  Surgeon: Loanne Drilling, MD;  Location: [facility];  Service: Orthopedics;  Laterality: Right;  WITH DEBRIDEMENT of medial menisces    Family History  Problem Relation Age of Onset  . Arrhythmia Mother   . Heart failure Mother   . Stroke Mother   . Heart attack Father   . Diabetes Brother   . Heart disease Brother     History  Substance Use Topics  . Smoking status: Former Smoker -- 20 years    Types: Cigarettes    Quit date: 05/05/1996  . Smokeless tobacco: Never Used  . Alcohol Use: No    OB History   Grav Para Term Preterm Abortions TAB SAB Ect Mult Living                  Review of Systems 10 Systems reviewed and are negative for acute change except as noted in the HPI. Allergies  Amoxicillin and Sulfonamide derivatives  Home Medications   Current Outpatient Rx  Name  Route  Sig  Dispense  Refill  . albuterol (PROVENTIL HFA;VENTOLIN HFA) 108 (90 BASE) MCG/ACT inhaler   Inhalation   Inhale 2 puffs into the lungs every 4 (four) hours as needed for wheezing or shortness of breath.         [name] aspirin 81 MG tablet   Oral   Take 81 mg by mouth daily.          [name] b complex vitamins capsule   Oral   Take 1 capsule by mouth daily.          . calcium carbonate (OS-CAL) 600 MG TABS   Oral   Take 600 mg by mouth daily.          . cholecalciferol (VITAMIN D) 1000 UNITS tablet   Oral   Take 1,000 Units by mouth daily.           . Flaxseed, Linseed, (FLAXSEED OIL PO)   Oral   Take 1 tablet by mouth daily.          . furosemide (LASIX) 40 MG tablet   Oral   Take 40 mg by mouth daily.         . insulin glargine (LANTUS) 100 UNIT/ML injection   Subcutaneous   Inject 30 Units into the skin at bedtime.          [name] JANUVIA 50 MG tablet   Oral   Take 50 mg by mouth daily.          [name] losartan-hydrochlorothiazide (HYZAAR) 100-25 MG per tablet   Oral   Take 1 tablet by mouth daily.         . Multiple Vitamin (MULTIVITAMIN) tablet   Oral   Take 1 tablet by mouth daily.          . potassium chloride SA (K-DUR,KLOR-CON) 20 MEQ tablet   Oral   Take 20 mEq by mouth daily.         [name] tiotropium (SPIRIVA) 18 MCG inhalation capsule   Inhalation   Place 1 capsule (18 mcg total) into inhaler and inhale every morning.   30 capsule   5   . warfarin (COUMADIN) 5 MG tablet   Oral   Take 5-10 mg by mouth daily. Patient takes 10mg  on Mondays --  5 mg on all other days         . atorvastatin (LIPITOR) 80 MG tablet   Oral   Take 1 tablet (80 mg total) by mouth daily.   30 tablet   9   . glucose blood test strip      as directed.           BP 161/76  Pulse 81  Temp(Src) 97.9 F (36.6 C) (Oral)  Resp 18  SpO2 100%  Physical Exam  Nursing note and vitals reviewed. Constitutional:  Awake, alert, nontoxic appearance with baseline speech for patient.  HENT:  Mouth/Throat: No oropharyngeal exudate.  1 cm right parietal scalp laceration nontender with no active bleeding no foreign body sensation for which her options discussed with patient who prefers closure by secondary intention  Eyes: EOM are normal. Pupils are equal, round, and reactive to light. Right eye exhibits no discharge. Left eye exhibits no discharge.  Neck: Neck supple.  Cervical spine and back are nontender  Cardiovascular: Normal rate and regular rhythm.   No murmur heard. Pulmonary/Chest: Effort normal and breath sounds  normal. No stridor. No respiratory distress. She has no wheezes. She has no rales. She exhibits no tenderness.  Abdominal: Soft. Bowel sounds are normal. She exhibits no mass. There is no tenderness. There is no rebound.  Musculoskeletal: She exhibits tenderness. She exhibits no edema.  Baseline ROM, moves extremities with no obvious new focal weakness. Both arms and left leg nontender. Right leg is nontender the headache right foot dorsalis pedis pulse intact with normal light touch and good strength, right knee has a superficial abrasion with anterior tenderness over the patella only with no tenderness of the medial or lateral aspects of the joints no posterior tenderness negative Lachman's negative McMurray's no pain or laxity with varus or valgus stress testing  Lymphadenopathy:    She has no cervical adenopathy.  Neurological: She is alert.  Awake, alert, cooperative and aware of situation; motor strength bilaterally; sensation normal to light touch bilaterally; peripheral visual fields full to confrontation; no facial asymmetry; tongue midline; major cranial nerves appear intact; no pronator drift, normal finger to nose bilaterally  Skin: No rash noted.  Psychiatric: She has a normal mood and affect.    ED Course  Procedures (including critical care time)  Labs Reviewed - No data to display No results found.   1. Minor head injury without loss of consciousness, initial encounter   2. Warfarin-induced coagulopathy, initial encounter   3. Contusion of knee, right, initial encounter   4. Scalp laceration, initial encounter       MDM   Patient / Family / Caregiver informed of clinical course, understand medical decision-making process, and agree with plan.  I doubt any other EMC precluding discharge at this time including, but not necessarily limited to the following:ICH.        Hurman Horn, MD 03/01/13 [facility] Manners

## 2013-02-26 ENCOUNTER — Other Ambulatory Visit: Payer: Self-pay | Admitting: *Deleted

## 2013-02-26 ENCOUNTER — Encounter (HOSPITAL_COMMUNITY): Payer: Self-pay

## 2013-02-26 MED ORDER — ATORVASTATIN CALCIUM 80 MG PO TABS: 80.0000 mg | ORAL_TABLET | Freq: Every day | ORAL | Status: DC

## 2013-02-28 ENCOUNTER — Encounter (HOSPITAL_COMMUNITY)
Admission: RE | Admit: 2013-02-28 | Discharge: 2013-02-28 | Disposition: A | Discharge status: Home / Self Care | Payer: Self-pay | Source: Ambulatory Visit | Attending: Internal Medicine | Admitting: Internal Medicine

## 2013-03-05 ENCOUNTER — Encounter (HOSPITAL_COMMUNITY)
Admission: RE | Admit: 2013-03-05 | Discharge: 2013-03-05 | Disposition: A | Discharge status: Home / Self Care | Payer: Self-pay | Source: Ambulatory Visit | Attending: Internal Medicine | Admitting: Internal Medicine

## 2013-03-07 ENCOUNTER — Encounter (HOSPITAL_COMMUNITY): Payer: Self-pay

## 2013-03-07 DIAGNOSIS — I4891 Unspecified atrial fibrillation: Secondary | ICD-10-CM | POA: Insufficient documentation

## 2013-03-07 DIAGNOSIS — I119 Hypertensive heart disease without heart failure: Secondary | ICD-10-CM | POA: Insufficient documentation

## 2013-03-07 DIAGNOSIS — J449 Chronic obstructive pulmonary disease, unspecified: Secondary | ICD-10-CM | POA: Insufficient documentation

## 2013-03-07 DIAGNOSIS — E119 Type 2 diabetes mellitus without complications: Secondary | ICD-10-CM | POA: Insufficient documentation

## 2013-03-07 DIAGNOSIS — I428 Other cardiomyopathies: Secondary | ICD-10-CM | POA: Insufficient documentation

## 2013-03-07 DIAGNOSIS — Z5189 Encounter for other specified aftercare: Secondary | ICD-10-CM | POA: Insufficient documentation

## 2013-03-07 DIAGNOSIS — E785 Hyperlipidemia, unspecified: Secondary | ICD-10-CM | POA: Insufficient documentation

## 2013-03-07 DIAGNOSIS — J4489 Other specified chronic obstructive pulmonary disease: Secondary | ICD-10-CM | POA: Insufficient documentation

## 2013-03-12 ENCOUNTER — Encounter (HOSPITAL_COMMUNITY)
Admission: RE | Admit: 2013-03-12 | Discharge: 2013-03-12 | Disposition: A | Discharge status: Home / Self Care | Payer: Self-pay | Source: Ambulatory Visit | Attending: Internal Medicine | Admitting: Internal Medicine

## 2013-03-14 ENCOUNTER — Encounter (HOSPITAL_COMMUNITY): Payer: Self-pay

## 2013-03-19 ENCOUNTER — Encounter (HOSPITAL_COMMUNITY)
Admission: RE | Admit: 2013-03-19 | Discharge: 2013-03-19 | Disposition: A | Discharge status: Home / Self Care | Payer: Self-pay | Source: Ambulatory Visit | Attending: Internal Medicine | Admitting: Internal Medicine

## 2013-03-21 ENCOUNTER — Ambulatory Visit (INDEPENDENT_AMBULATORY_CARE_PROVIDER_SITE_OTHER): Payer: Medicare Other | Admitting: *Deleted

## 2013-03-21 ENCOUNTER — Encounter (HOSPITAL_COMMUNITY): Payer: Self-pay

## 2013-03-21 DIAGNOSIS — R899 Unspecified abnormal finding in specimens from other organs, systems and tissues: Secondary | ICD-10-CM

## 2013-03-21 DIAGNOSIS — I4891 Unspecified atrial fibrillation: Secondary | ICD-10-CM

## 2013-03-21 DIAGNOSIS — R6889 Other general symptoms and signs: Secondary | ICD-10-CM

## 2013-03-21 LAB — POCT INR: INR: 2.7

## 2013-03-26 ENCOUNTER — Encounter (HOSPITAL_COMMUNITY)
Admission: RE | Admit: 2013-03-26 | Discharge: 2013-03-26 | Disposition: A | Discharge status: Home / Self Care | Payer: Self-pay | Source: Ambulatory Visit | Attending: Internal Medicine | Admitting: Internal Medicine

## 2013-03-28 ENCOUNTER — Encounter (HOSPITAL_COMMUNITY): Payer: Self-pay

## 2013-04-02 ENCOUNTER — Encounter (HOSPITAL_COMMUNITY)
Admission: RE | Admit: 2013-04-02 | Discharge: 2013-04-02 | Disposition: A | Discharge status: Home / Self Care | Payer: Self-pay | Source: Ambulatory Visit | Attending: Internal Medicine | Admitting: Internal Medicine

## 2013-04-04 ENCOUNTER — Encounter (HOSPITAL_COMMUNITY)
Admission: RE | Admit: 2013-04-04 | Discharge: 2013-04-04 | Disposition: A | Discharge status: Home / Self Care | Payer: Self-pay | Source: Ambulatory Visit | Attending: Internal Medicine | Admitting: Internal Medicine

## 2013-04-09 ENCOUNTER — Encounter (HOSPITAL_COMMUNITY): Payer: Self-pay

## 2013-04-09 DIAGNOSIS — J4489 Other specified chronic obstructive pulmonary disease: Secondary | ICD-10-CM | POA: Insufficient documentation

## 2013-04-09 DIAGNOSIS — I119 Hypertensive heart disease without heart failure: Secondary | ICD-10-CM | POA: Insufficient documentation

## 2013-04-09 DIAGNOSIS — E785 Hyperlipidemia, unspecified: Secondary | ICD-10-CM | POA: Insufficient documentation

## 2013-04-09 DIAGNOSIS — E119 Type 2 diabetes mellitus without complications: Secondary | ICD-10-CM | POA: Insufficient documentation

## 2013-04-09 DIAGNOSIS — Z5189 Encounter for other specified aftercare: Secondary | ICD-10-CM | POA: Insufficient documentation

## 2013-04-09 DIAGNOSIS — I4891 Unspecified atrial fibrillation: Secondary | ICD-10-CM | POA: Insufficient documentation

## 2013-04-09 DIAGNOSIS — J449 Chronic obstructive pulmonary disease, unspecified: Secondary | ICD-10-CM | POA: Insufficient documentation

## 2013-04-09 DIAGNOSIS — I428 Other cardiomyopathies: Secondary | ICD-10-CM | POA: Insufficient documentation

## 2013-04-11 ENCOUNTER — Encounter (HOSPITAL_COMMUNITY): Payer: Self-pay

## 2013-04-16 ENCOUNTER — Encounter (HOSPITAL_COMMUNITY)
Admission: RE | Admit: 2013-04-16 | Discharge: 2013-04-16 | Disposition: A | Discharge status: Home / Self Care | Payer: Self-pay | Source: Ambulatory Visit | Attending: Internal Medicine | Admitting: Internal Medicine

## 2013-04-18 ENCOUNTER — Encounter (HOSPITAL_COMMUNITY): Payer: Self-pay

## 2013-04-23 ENCOUNTER — Encounter (HOSPITAL_COMMUNITY)
Admission: RE | Admit: 2013-04-23 | Discharge: 2013-04-23 | Disposition: A | Discharge status: Home / Self Care | Payer: Self-pay | Source: Ambulatory Visit | Attending: Internal Medicine | Admitting: Internal Medicine

## 2013-04-25 ENCOUNTER — Encounter (HOSPITAL_COMMUNITY): Payer: Self-pay

## 2013-04-26 ENCOUNTER — Other Ambulatory Visit: Payer: Medicare Other

## 2013-04-26 ENCOUNTER — Ambulatory Visit (INDEPENDENT_AMBULATORY_CARE_PROVIDER_SITE_OTHER): Payer: Medicare Other

## 2013-04-26 ENCOUNTER — Encounter: Payer: Self-pay | Admitting: Cardiology

## 2013-04-26 ENCOUNTER — Ambulatory Visit (INDEPENDENT_AMBULATORY_CARE_PROVIDER_SITE_OTHER): Payer: Medicare Other | Admitting: Cardiology

## 2013-04-26 VITALS — BP 132/74 | HR 84 | Ht 63.0 in | Wt 201.4 lb

## 2013-04-26 DIAGNOSIS — I4891 Unspecified atrial fibrillation: Secondary | ICD-10-CM

## 2013-04-26 DIAGNOSIS — I119 Hypertensive heart disease without heart failure: Secondary | ICD-10-CM

## 2013-04-26 DIAGNOSIS — R899 Unspecified abnormal finding in specimens from other organs, systems and tissues: Secondary | ICD-10-CM

## 2013-04-26 DIAGNOSIS — R6889 Other general symptoms and signs: Secondary | ICD-10-CM

## 2013-04-26 DIAGNOSIS — E119 Type 2 diabetes mellitus without complications: Secondary | ICD-10-CM

## 2013-04-26 DIAGNOSIS — E7800 Pure hypercholesterolemia, unspecified: Secondary | ICD-10-CM

## 2013-04-26 LAB — BASIC METABOLIC PANEL
BUN: 19 mg/dL (ref 6–23)
Chloride: 103 mEq/L (ref 96–112)
Creatinine, Ser: 0.9 mg/dL (ref 0.4–1.2)
Glucose, Bld: 116 mg/dL — ABNORMAL HIGH (ref 70–99)
Potassium: 3.6 mEq/L (ref 3.5–5.1)

## 2013-04-26 LAB — HEPATIC FUNCTION PANEL
Alkaline Phosphatase: 85 U/L (ref 39–117)
Bilirubin, Direct: 0 mg/dL (ref 0.0–0.3)
Total Bilirubin: 0.6 mg/dL (ref 0.3–1.2)

## 2013-04-26 LAB — LIPID PANEL
LDL Cholesterol: 81 mg/dL (ref 0–99)
Total CHOL/HDL Ratio: 3
VLDL: 16.4 mg/dL (ref 0.0–40.0)

## 2013-04-26 NOTE — Progress Notes (Signed)
[name] Date of Birth:  1941-04-17 [facility] [address] [place], [place]  [address] [phone]  Fax   [phone]  HPI: This pleasant 72 year old woman is seen for a scheduled four-month followup office visit. She has a history of diabetes. Her diabetes is followed by [name]. She has a history of high blood pressure and a history of established chronic atrial fibrillation. She had a remote attempt at cardioversion which was unsuccessful. She has not been having any symptoms from her atrial fibrillation. He also has a history of asthma and is followed by pulmonary. She participates in the pulmonary rehabilitation program.  Since last visit she has been feeling well.  She has reinjured her right knee.  She has had a previous meniscus tear repair by Dr. Lequita Halt.  She has been taking extra strength Tylenol 2 tablets twice a day with good response.   Current Outpatient Prescriptions  Medication Sig Dispense Refill  . albuterol (PROVENTIL HFA;VENTOLIN HFA) 108 (90 BASE) MCG/ACT inhaler Inhale 2 puffs into the lungs every 4 (four) hours as needed for wheezing or shortness of breath.      [name] aspirin 81 MG tablet Take 81 mg by mouth daily.       [name] atorvastatin (LIPITOR) 80 MG tablet Take 1 tablet (80 mg total) by mouth daily.  30 tablet  9  . b complex vitamins capsule Take 1 capsule by mouth daily.       . calcium carbonate (OS-CAL) 600 MG TABS Take 600 mg by mouth daily.       . cholecalciferol (VITAMIN D) 1000 UNITS tablet Take 1,000 Units by mouth daily.       . Flaxseed, Linseed, (FLAX SEED OIL PO) Take by mouth daily.      . furosemide (LASIX) 40 MG tablet Take 40 mg by mouth daily.      [name] glucose blood test strip as directed.      . insulin glargine (LANTUS) 100 UNIT/ML injection Inject 40 Units into the skin at bedtime.       [name] losartan-hydrochlorothiazide (HYZAAR) 100-25 MG per tablet Take 1 tablet by mouth daily.      . Multiple Vitamin (MULTIVITAMIN)  tablet Take 1 tablet by mouth daily.       . potassium chloride SA (K-DUR,KLOR-CON) 20 MEQ tablet Take 20 mEq by mouth daily.      . sitaGLIPtan-metformin (JANUMET) 50-1000 MG per tablet Take 1 tablet by mouth daily.      [name] tiotropium (SPIRIVA) 18 MCG inhalation capsule Place 1 capsule (18 mcg total) into inhaler and inhale every morning.  30 capsule  5  . warfarin (COUMADIN) 5 MG tablet Take 5-10 mg by mouth daily. Patient takes 10mg  on Mondays --  5 mg on all other days       No current facility-administered medications for this visit.    Allergies  Allergen Reactions  . Amoxicillin Diarrhea  . Sulfonamide Derivatives Other (See Comments)    REACTION: thrush    Patient Active Problem List   Diagnosis Date Noted  . Heel spur 09/06/2012  . Acute medial meniscal tear 03/02/2012  . Hyperlipidemia 08/24/2011  . Benign hypertensive heart disease without heart failure 05/16/2011  . Asthma with COPD 09/03/2009  . DIABETES MELLITUS 09/04/2008  . CARDIOMYOPATHY 09/04/2008  . ATRIAL FIBRILLATION 09/04/2008  . ASTHMATIC BRONCHITIS, ACUTE 09/04/2008    History  Smoking status  . Former Smoker -- 20 years  . Types: Cigarettes  .  Quit date: 05/05/1996  Smokeless tobacco  . Never Used    History  Alcohol Use No    Family History  Problem Relation Age of Onset  . Arrhythmia Mother   . Heart failure Mother   . Stroke Mother   . Heart attack Father   . Diabetes Brother   . Heart disease Brother     Review of Systems: The patient denies any heat or cold intolerance.  No weight gain or weight loss.  The patient denies headaches or blurry vision.  There is no cough or sputum production.  The patient denies dizziness.  There is no hematuria or hematochezia.  The patient denies any muscle aches or arthritis.  The patient denies any rash.  The patient denies frequent falling or instability.  There is no history of depression or anxiety.  All other systems were reviewed and are  negative.   Physical Exam: Filed Vitals:   04/26/13 1205  BP: 132/74  Pulse: 84   the general appearance reveals a well-developed well-nourished woman in no distress.  Weight is up 2 pounds since last visit.The head and neck exam reveals pupils equal and reactive.  Extraocular movements are full.  There is no scleral icterus.  The mouth and pharynx are normal.  The neck is supple.  The carotids reveal no bruits.  The jugular venous pressure is normal.  The  thyroid is not enlarged.  There is no lymphadenopathy.  The chest is clear to percussion and auscultation.  There are no rales or rhonchi.  Expansion of the chest is symmetrical.  The precordium is quiet.  The pulse is irregularly irregular The first heart sound is normal.  The second heart sound is physiologically split.  There is no murmur gallop rub or click.  There is no abnormal lift or heave.  The abdomen is soft and nontender.  The bowel sounds are normal.  The liver and spleen are not enlarged.  There are no abdominal masses.  There are no abdominal bruits.  Extremities reveal good pedal pulses.  There is no phlebitis or edema.  There is no cyanosis or clubbing.  Strength is normal and symmetrical in all extremities.  There is no lateralizing weakness.  There are no sensory deficits.  The skin is warm and dry.  There is no rash.      Assessment / Plan:  Continue on same medication.  Careful diet.  Blood work today is pending.  Recheck in 4 months for office visit lipid panel hepatic function panel and basal metabolic panel.

## 2013-04-26 NOTE — Assessment & Plan Note (Signed)
The patient has not had any hypoglycemic episodes.  Her most recent A1c was 7.6.  She was recently placed back on metformin in the form of Janumet by her endocrinologist [name]

## 2013-04-26 NOTE — Assessment & Plan Note (Signed)
She has her blood pressure checked twice a week when she goes to the pulmonary rehabilitation program.  Normally systolic blood pressures running in the 110 range.  She is not having any dizziness or syncope.

## 2013-04-26 NOTE — Assessment & Plan Note (Signed)
The patient is on long-term Coumadin for her chronic permanent atrial fibrillation.  She has not been experiencing any TIA symptoms.  No complications from anticoagulation.

## 2013-04-26 NOTE — Patient Instructions (Addendum)
Continue Medications   Your physician wants you to follow-up in: 4 months with fasting lab work. You will receive a reminder letter in the mail two months in advance. If you don't receive a letter, please call our office to schedule the follow-up appointment.

## 2013-04-27 NOTE — Progress Notes (Signed)
Quick Note:  Please report to patient. The recent labs are stable. Continue same medication and careful diet. ______ 

## 2013-04-29 ENCOUNTER — Telehealth: Payer: Self-pay | Admitting: *Deleted

## 2013-04-29 NOTE — Telephone Encounter (Signed)
Mailed copy of labs and left message to call if any questions  

## 2013-04-29 NOTE — Telephone Encounter (Signed)
Message copied by Burnell Blanks on Mon Apr 29, 2013  3:14 PM ------      Message from: Cassell Clement      Created: Sat Apr 27, 2013 10:09 PM       Please report to patient.  The recent labs are stable. Continue same medication and careful diet. ------

## 2013-04-30 ENCOUNTER — Encounter (HOSPITAL_COMMUNITY)
Admission: RE | Admit: 2013-04-30 | Discharge: 2013-04-30 | Disposition: A | Discharge status: Home / Self Care | Payer: Self-pay | Source: Ambulatory Visit | Attending: Internal Medicine | Admitting: Internal Medicine

## 2013-05-02 ENCOUNTER — Encounter (HOSPITAL_COMMUNITY)
Admission: RE | Admit: 2013-05-02 | Discharge: 2013-05-02 | Disposition: A | Discharge status: Home / Self Care | Payer: Self-pay | Source: Ambulatory Visit | Attending: Internal Medicine | Admitting: Internal Medicine

## 2013-05-07 ENCOUNTER — Encounter (HOSPITAL_COMMUNITY)
Admission: RE | Admit: 2013-05-07 | Discharge: 2013-05-07 | Disposition: A | Discharge status: Home / Self Care | Payer: Self-pay | Source: Ambulatory Visit | Attending: Internal Medicine | Admitting: Internal Medicine

## 2013-05-07 DIAGNOSIS — I4891 Unspecified atrial fibrillation: Secondary | ICD-10-CM | POA: Insufficient documentation

## 2013-05-07 DIAGNOSIS — Z5189 Encounter for other specified aftercare: Secondary | ICD-10-CM | POA: Insufficient documentation

## 2013-05-07 DIAGNOSIS — I119 Hypertensive heart disease without heart failure: Secondary | ICD-10-CM | POA: Insufficient documentation

## 2013-05-07 DIAGNOSIS — I428 Other cardiomyopathies: Secondary | ICD-10-CM | POA: Insufficient documentation

## 2013-05-07 DIAGNOSIS — E119 Type 2 diabetes mellitus without complications: Secondary | ICD-10-CM | POA: Insufficient documentation

## 2013-05-07 DIAGNOSIS — E785 Hyperlipidemia, unspecified: Secondary | ICD-10-CM | POA: Insufficient documentation

## 2013-05-07 DIAGNOSIS — J4489 Other specified chronic obstructive pulmonary disease: Secondary | ICD-10-CM | POA: Insufficient documentation

## 2013-05-07 DIAGNOSIS — J449 Chronic obstructive pulmonary disease, unspecified: Secondary | ICD-10-CM | POA: Insufficient documentation

## 2013-05-09 ENCOUNTER — Encounter (HOSPITAL_COMMUNITY)
Admission: RE | Admit: 2013-05-09 | Discharge: 2013-05-09 | Disposition: A | Discharge status: Home / Self Care | Payer: Self-pay | Source: Ambulatory Visit | Attending: Internal Medicine | Admitting: Internal Medicine

## 2013-05-14 ENCOUNTER — Encounter (HOSPITAL_COMMUNITY): Payer: Self-pay

## 2013-05-16 ENCOUNTER — Encounter (HOSPITAL_COMMUNITY)
Admission: RE | Admit: 2013-05-16 | Discharge: 2013-05-16 | Disposition: A | Discharge status: Home / Self Care | Payer: Self-pay | Source: Ambulatory Visit | Attending: Internal Medicine | Admitting: Internal Medicine

## 2013-05-18 ENCOUNTER — Other Ambulatory Visit: Payer: Self-pay | Admitting: Cardiology

## 2013-05-21 ENCOUNTER — Other Ambulatory Visit: Payer: Self-pay | Admitting: Physician Assistant

## 2013-05-21 ENCOUNTER — Encounter (HOSPITAL_COMMUNITY)
Admission: RE | Admit: 2013-05-21 | Discharge: 2013-05-21 | Disposition: A | Discharge status: Home / Self Care | Payer: Self-pay | Source: Ambulatory Visit | Attending: Internal Medicine | Admitting: Internal Medicine

## 2013-05-23 ENCOUNTER — Encounter (HOSPITAL_COMMUNITY)
Admission: RE | Admit: 2013-05-23 | Discharge: 2013-05-23 | Disposition: A | Discharge status: Home / Self Care | Payer: Self-pay | Source: Ambulatory Visit | Attending: Internal Medicine | Admitting: Internal Medicine

## 2013-05-28 ENCOUNTER — Encounter (HOSPITAL_COMMUNITY)
Admission: RE | Admit: 2013-05-28 | Discharge: 2013-05-28 | Disposition: A | Discharge status: Home / Self Care | Payer: Self-pay | Source: Ambulatory Visit | Attending: Internal Medicine | Admitting: Internal Medicine

## 2013-05-30 ENCOUNTER — Encounter (HOSPITAL_COMMUNITY): Payer: Self-pay

## 2013-06-04 ENCOUNTER — Encounter (HOSPITAL_COMMUNITY): Payer: Self-pay

## 2013-06-06 ENCOUNTER — Encounter (HOSPITAL_COMMUNITY): Payer: Self-pay

## 2013-06-10 ENCOUNTER — Ambulatory Visit (INDEPENDENT_AMBULATORY_CARE_PROVIDER_SITE_OTHER): Payer: Medicare Other | Admitting: *Deleted

## 2013-06-10 DIAGNOSIS — R899 Unspecified abnormal finding in specimens from other organs, systems and tissues: Secondary | ICD-10-CM

## 2013-06-10 DIAGNOSIS — I4891 Unspecified atrial fibrillation: Secondary | ICD-10-CM

## 2013-06-10 DIAGNOSIS — R6889 Other general symptoms and signs: Secondary | ICD-10-CM

## 2013-06-11 ENCOUNTER — Encounter (HOSPITAL_COMMUNITY): Payer: Self-pay

## 2013-06-11 DIAGNOSIS — J449 Chronic obstructive pulmonary disease, unspecified: Secondary | ICD-10-CM | POA: Insufficient documentation

## 2013-06-11 DIAGNOSIS — E785 Hyperlipidemia, unspecified: Secondary | ICD-10-CM | POA: Insufficient documentation

## 2013-06-11 DIAGNOSIS — I119 Hypertensive heart disease without heart failure: Secondary | ICD-10-CM | POA: Insufficient documentation

## 2013-06-11 DIAGNOSIS — I428 Other cardiomyopathies: Secondary | ICD-10-CM | POA: Insufficient documentation

## 2013-06-11 DIAGNOSIS — I4891 Unspecified atrial fibrillation: Secondary | ICD-10-CM | POA: Insufficient documentation

## 2013-06-11 DIAGNOSIS — J4489 Other specified chronic obstructive pulmonary disease: Secondary | ICD-10-CM | POA: Insufficient documentation

## 2013-06-11 DIAGNOSIS — E119 Type 2 diabetes mellitus without complications: Secondary | ICD-10-CM | POA: Insufficient documentation

## 2013-06-11 DIAGNOSIS — Z5189 Encounter for other specified aftercare: Secondary | ICD-10-CM | POA: Insufficient documentation

## 2013-06-13 ENCOUNTER — Encounter (HOSPITAL_COMMUNITY): Payer: Self-pay

## 2013-06-18 ENCOUNTER — Encounter (HOSPITAL_COMMUNITY): Payer: Self-pay

## 2013-06-20 ENCOUNTER — Encounter (HOSPITAL_COMMUNITY)
Admission: RE | Admit: 2013-06-20 | Discharge: 2013-06-20 | Disposition: A | Discharge status: Home / Self Care | Payer: Self-pay | Source: Ambulatory Visit | Attending: Internal Medicine | Admitting: Internal Medicine

## 2013-06-25 ENCOUNTER — Encounter (HOSPITAL_COMMUNITY)
Admission: RE | Admit: 2013-06-25 | Discharge: 2013-06-25 | Disposition: A | Discharge status: Home / Self Care | Payer: Self-pay | Source: Ambulatory Visit | Attending: Internal Medicine | Admitting: Internal Medicine

## 2013-06-27 ENCOUNTER — Encounter (HOSPITAL_COMMUNITY)
Admission: RE | Admit: 2013-06-27 | Discharge: 2013-06-27 | Disposition: A | Discharge status: Home / Self Care | Payer: Self-pay | Source: Ambulatory Visit | Attending: Internal Medicine | Admitting: Internal Medicine

## 2013-07-02 ENCOUNTER — Encounter (HOSPITAL_COMMUNITY): Payer: Self-pay

## 2013-07-04 ENCOUNTER — Encounter (HOSPITAL_COMMUNITY)
Admission: RE | Admit: 2013-07-04 | Discharge: 2013-07-04 | Disposition: A | Discharge status: Home / Self Care | Payer: Self-pay | Source: Ambulatory Visit | Attending: Internal Medicine | Admitting: Internal Medicine

## 2013-07-06 ENCOUNTER — Other Ambulatory Visit: Payer: Self-pay | Admitting: Internal Medicine

## 2013-07-09 ENCOUNTER — Encounter (HOSPITAL_COMMUNITY): Payer: Medicare Other

## 2013-07-09 ENCOUNTER — Other Ambulatory Visit: Payer: Self-pay | Admitting: *Deleted

## 2013-07-09 DIAGNOSIS — I119 Hypertensive heart disease without heart failure: Secondary | ICD-10-CM | POA: Insufficient documentation

## 2013-07-09 DIAGNOSIS — E119 Type 2 diabetes mellitus without complications: Secondary | ICD-10-CM | POA: Insufficient documentation

## 2013-07-09 DIAGNOSIS — E785 Hyperlipidemia, unspecified: Secondary | ICD-10-CM | POA: Insufficient documentation

## 2013-07-09 DIAGNOSIS — Z5189 Encounter for other specified aftercare: Secondary | ICD-10-CM | POA: Insufficient documentation

## 2013-07-09 DIAGNOSIS — J4489 Other specified chronic obstructive pulmonary disease: Secondary | ICD-10-CM | POA: Insufficient documentation

## 2013-07-09 DIAGNOSIS — I4891 Unspecified atrial fibrillation: Secondary | ICD-10-CM | POA: Insufficient documentation

## 2013-07-09 DIAGNOSIS — I428 Other cardiomyopathies: Secondary | ICD-10-CM | POA: Insufficient documentation

## 2013-07-09 DIAGNOSIS — J449 Chronic obstructive pulmonary disease, unspecified: Secondary | ICD-10-CM | POA: Insufficient documentation

## 2013-07-09 MED ORDER — FUROSEMIDE 40 MG PO TABS: 40.0000 mg | ORAL_TABLET | Freq: Every day | ORAL | Status: DC

## 2013-07-11 ENCOUNTER — Encounter (HOSPITAL_COMMUNITY)
Admission: RE | Admit: 2013-07-11 | Discharge: 2013-07-11 | Disposition: A | Discharge status: Home / Self Care | Payer: Self-pay | Source: Ambulatory Visit | Attending: Internal Medicine | Admitting: Internal Medicine

## 2013-07-16 ENCOUNTER — Encounter (HOSPITAL_COMMUNITY)
Admission: RE | Admit: 2013-07-16 | Discharge: 2013-07-16 | Disposition: A | Discharge status: Home / Self Care | Payer: Self-pay | Source: Ambulatory Visit | Attending: Internal Medicine | Admitting: Internal Medicine

## 2013-07-18 ENCOUNTER — Encounter (HOSPITAL_COMMUNITY): Payer: Medicare Other

## 2013-07-20 ENCOUNTER — Emergency Department (HOSPITAL_COMMUNITY)
Admission: EM | Admit: 2013-07-20 | Discharge: 2013-07-20 | Disposition: A | Discharge status: Home / Self Care | Payer: Medicare Other | Attending: Emergency Medicine | Admitting: Emergency Medicine

## 2013-07-20 ENCOUNTER — Encounter (HOSPITAL_COMMUNITY): Payer: Self-pay | Admitting: Emergency Medicine

## 2013-07-20 ENCOUNTER — Emergency Department (HOSPITAL_COMMUNITY): Payer: Medicare Other

## 2013-07-20 DIAGNOSIS — M1990 Unspecified osteoarthritis, unspecified site: Secondary | ICD-10-CM | POA: Insufficient documentation

## 2013-07-20 DIAGNOSIS — E119 Type 2 diabetes mellitus without complications: Secondary | ICD-10-CM | POA: Insufficient documentation

## 2013-07-20 DIAGNOSIS — Y9389 Activity, other specified: Secondary | ICD-10-CM | POA: Insufficient documentation

## 2013-07-20 DIAGNOSIS — S0093XA Contusion of unspecified part of head, initial encounter: Secondary | ICD-10-CM

## 2013-07-20 DIAGNOSIS — Y929 Unspecified place or not applicable: Secondary | ICD-10-CM | POA: Insufficient documentation

## 2013-07-20 DIAGNOSIS — Z794 Long term (current) use of insulin: Secondary | ICD-10-CM | POA: Insufficient documentation

## 2013-07-20 DIAGNOSIS — S0003XA Contusion of scalp, initial encounter: Secondary | ICD-10-CM | POA: Insufficient documentation

## 2013-07-20 DIAGNOSIS — Z87891 Personal history of nicotine dependence: Secondary | ICD-10-CM | POA: Insufficient documentation

## 2013-07-20 DIAGNOSIS — Z8701 Personal history of pneumonia (recurrent): Secondary | ICD-10-CM | POA: Insufficient documentation

## 2013-07-20 DIAGNOSIS — W19XXXA Unspecified fall, initial encounter: Secondary | ICD-10-CM

## 2013-07-20 DIAGNOSIS — W1809XA Striking against other object with subsequent fall, initial encounter: Secondary | ICD-10-CM | POA: Insufficient documentation

## 2013-07-20 DIAGNOSIS — I129 Hypertensive chronic kidney disease with stage 1 through stage 4 chronic kidney disease, or unspecified chronic kidney disease: Secondary | ICD-10-CM | POA: Insufficient documentation

## 2013-07-20 DIAGNOSIS — N189 Chronic kidney disease, unspecified: Secondary | ICD-10-CM | POA: Insufficient documentation

## 2013-07-20 DIAGNOSIS — J4489 Other specified chronic obstructive pulmonary disease: Secondary | ICD-10-CM | POA: Insufficient documentation

## 2013-07-20 DIAGNOSIS — S0990XA Unspecified injury of head, initial encounter: Secondary | ICD-10-CM | POA: Insufficient documentation

## 2013-07-20 DIAGNOSIS — Z7901 Long term (current) use of anticoagulants: Secondary | ICD-10-CM | POA: Insufficient documentation

## 2013-07-20 DIAGNOSIS — E785 Hyperlipidemia, unspecified: Secondary | ICD-10-CM | POA: Insufficient documentation

## 2013-07-20 DIAGNOSIS — I4891 Unspecified atrial fibrillation: Secondary | ICD-10-CM | POA: Insufficient documentation

## 2013-07-20 DIAGNOSIS — Z7982 Long term (current) use of aspirin: Secondary | ICD-10-CM | POA: Insufficient documentation

## 2013-07-20 DIAGNOSIS — J449 Chronic obstructive pulmonary disease, unspecified: Secondary | ICD-10-CM | POA: Insufficient documentation

## 2013-07-20 DIAGNOSIS — Z79899 Other long term (current) drug therapy: Secondary | ICD-10-CM | POA: Insufficient documentation

## 2013-07-20 LAB — PROTIME-INR
INR: 1.79 — ABNORMAL HIGH (ref 0.00–1.49)
Prothrombin Time: 20.3 seconds — ABNORMAL HIGH (ref 11.6–15.2)

## 2013-07-20 NOTE — ED Notes (Signed)
Pt ambulating with slow, steady gait. Denies dizziness HA, or lightheadedness

## 2013-07-20 NOTE — ED Notes (Signed)
Pt. fell backwards and hit her head against floor this evening while trying to sit on a chair , no LOC / ambulatory , alert and oriented /respirations unlabored , denies pain . Pt. stated she is taking Coumadin for atrial fibrillation .

## 2013-07-20 NOTE — ED Provider Notes (Signed)
CSN: [id number]     Arrival date & time 07/20/13  2056 History   First MD Initiated Contact with Patient 07/20/13 2146     Chief Complaint  Patient presents with  . Fall   (Consider location/radiation/quality/duration/timing/severity/associated sxs/prior Treatment) Patient is a 72 y.o. female presenting with fall. The history is provided by the patient. No language interpreter was used.  Fall This is a new problem. The current episode started today. The problem occurs rarely. The problem has been resolved. Pertinent negatives include no abdominal pain, chest pain, congestion, coughing, fever, headaches, nausea, rash, sore throat, visual change or vomiting. Nothing aggravates the symptoms. She has tried nothing for the symptoms. The treatment provided no relief.    Past Medical History  Diagnosis Date  . Hypertension   . Hyperlipidemia   . Chronic anticoagulation COUMADIN  . Normal nuclear stress test 2008  . Abnormal echocardiogram 2010    showing slightly enlarged left atrium with normal LV function and normal PA pressures.   . Chronic atrial fibrillation CARDIOLOGIST- DR BRACKBILL-- LAST VISIT  12-16-2011 IN [facility]  . Acute medial meniscal injury of knee RIGHT KNEE  . Asthmatic bronchitis , chronic   . OA (osteoarthritis) of knee HANDS  . History of pneumonia associated w/  rhabdomyolysis  OCT 2012  . COPD, moderate PULMOLOGIST- DR YOUNG --  LAST VISIT NOTE 02-23-2012 IN [facility]  . Bruise RIGHT ABD. DUE TO LOVENOX INJECTION  02-27-2012  . Chronic renal insufficiency   . Diabetes mellitus INSULIN AND ORAL MEDS  . Non-ischemic cardiomyopathy normal ef  . COPD with asthma   . Atrial fibrillation    Past Surgical History  Procedure Laterality Date  . Breast enhancement surgery  1970    1990--- BREAST IMPLANTS REMOVED   . Vaginal hysterectomy  1978  . Knee arthroscopy  03/02/2012    Procedure: ARTHROSCOPY KNEE;  Surgeon: Loanne Drilling, MD;  Location: [facility];   Service: Orthopedics;  Laterality: Right;  WITH DEBRIDEMENT of medial menisces   Family History  Problem Relation Age of Onset  . Arrhythmia Mother   . Heart failure Mother   . Stroke Mother   . Heart attack Father   . Diabetes Brother   . Heart disease Brother    History  Substance Use Topics  . Smoking status: Former Smoker -- 20 years    Types: Cigarettes    Quit date: 05/05/1996  . Smokeless tobacco: Never Used  . Alcohol Use: No   OB History   Grav Para Term Preterm Abortions TAB SAB Ect Mult Living                 Review of Systems  Constitutional: Negative for fever.  HENT: Negative for congestion, sore throat and rhinorrhea.   Respiratory: Negative for cough and shortness of breath.   Cardiovascular: Negative for chest pain.  Gastrointestinal: Negative for nausea, vomiting, abdominal pain and diarrhea.  Genitourinary: Negative for dysuria and hematuria.  Skin: Negative for rash.  Neurological: Negative for syncope, light-headedness and headaches.  All other systems reviewed and are negative.    Allergies  Amoxicillin and Sulfonamide derivatives  Home Medications   Current Outpatient Rx  Name  Route  Sig  Dispense  Refill  . albuterol (PROVENTIL HFA;VENTOLIN HFA) 108 (90 BASE) MCG/ACT inhaler   Inhalation   Inhale 2 puffs into the lungs every 4 (four) hours as needed for wheezing or shortness of breath.         [name]  aspirin 81 MG tablet   Oral   Take 81 mg by mouth daily.          [name] atorvastatin (LIPITOR) 80 MG tablet   Oral   Take 1 tablet (80 mg total) by mouth daily.   30 tablet   9   . b complex vitamins capsule   Oral   Take 1 capsule by mouth daily.          . calcium carbonate (OS-CAL) 600 MG TABS   Oral   Take 600 mg by mouth daily.          . cholecalciferol (VITAMIN D) 1000 UNITS tablet   Oral   Take 1,000 Units by mouth daily.          . Flaxseed, Linseed, (FLAX SEED OIL PO)   Oral   Take by mouth daily.         .  furosemide (LASIX) 40 MG tablet   Oral   Take 1 tablet (40 mg total) by mouth daily.   90 tablet   1   . glucose blood test strip      as directed.         . insulin glargine (LANTUS) 100 UNIT/ML injection   Subcutaneous   Inject 40 Units into the skin at bedtime.          [name] losartan-hydrochlorothiazide (HYZAAR) 100-25 MG per tablet   Oral   Take 1 tablet by mouth daily.         [name] losartan-hydrochlorothiazide (HYZAAR) 100-25 MG per tablet      TAKE 1 TABLET BY MOUTH DAILY.   90 tablet   3   . Multiple Vitamin (MULTIVITAMIN) tablet   Oral   Take 1 tablet by mouth daily.          . potassium chloride SA (K-DUR,KLOR-CON) 20 MEQ tablet   Oral   Take 20 mEq by mouth daily.         [name] PROAIR HFA 108 (90 BASE) MCG/ACT inhaler      INHALE 2 PUFFS EVERY 4 HOURS AS NEEDED.   8.5 each   2   . sitaGLIPtan-metformin (JANUMET) 50-1000 MG per tablet   Oral   Take 1 tablet by mouth daily.         [name] tiotropium (SPIRIVA) 18 MCG inhalation capsule   Inhalation   Place 1 capsule (18 mcg total) into inhaler and inhale every morning.   30 capsule   5   . warfarin (COUMADIN) 5 MG tablet   Oral   Take 5-10 mg by mouth daily. Patient takes 10mg  on Mondays --  5 mg on all other days          BP 166/91  Pulse 98  Temp(Src) 97.9 F (36.6 C) (Oral)  Resp 16  SpO2 98% Physical Exam  Nursing note and vitals reviewed. Constitutional: She is oriented to person, place, and time. She appears well-developed and well-nourished.  HENT:  Head: Normocephalic and atraumatic.  Right Ear: External ear normal.  Left Ear: External ear normal.  Eyes: EOM are normal. Pupils are equal, round, and reactive to light.  Neck: Normal range of motion. Neck supple.  Cardiovascular: Normal heart sounds and intact distal pulses.  Exam reveals no gallop and no friction rub.   No murmur heard. irreg irreg  Pulmonary/Chest: Effort normal and breath sounds normal. No respiratory distress. She  has no wheezes. She has no rales. She exhibits no tenderness.  Abdominal: Soft.  Bowel sounds are normal. She exhibits no distension. There is no tenderness. There is no rebound.  Musculoskeletal: Normal range of motion. She exhibits no edema and no tenderness.  Lymphadenopathy:    She has no cervical adenopathy.  Neurological: She is alert and oriented to person, place, and time.  Skin: Skin is warm. No rash noted.  Psychiatric: She has a normal mood and affect. Her behavior is normal.    ED Course  Procedures (including critical care time) Labs Review Labs Reviewed  PROTIME-INR - Abnormal; Notable for the following:    Prothrombin Time 20.3 (*)    INR 1.79 (*)    All other components within normal limits   Imaging Review Ct Head Wo Contrast  07/20/2013   CLINICAL DATA:  Fall, hit head.  On Coumadi.  EXAM: CT HEAD WITHOUT CONTRAST  CT CERVICAL SPINE WITHOUT CONTRAST  TECHNIQUE: Multidetector CT imaging of the head and cervical spine was performed following the standard protocol without intravenous contrast. Multiplanar CT image reconstructions of the cervical spine were also generated.  COMPARISON:  02/23/2013  FINDINGS: CT HEAD FINDINGS  No acute intracranial abnormality. Specifically, no hemorrhage, hydrocephalus, mass lesion, acute infarction, or significant intracranial injury. No acute calvarial abnormality. Visualized paranasal sinuses and mastoids clear. Orbital soft tissues unremarkable.  CT CERVICAL SPINE FINDINGS  Diffuse degenerative facet disease bilaterally, left greater than right. Degenerative disc disease in the lower cervical spine. Normal alignment. Prevertebral soft tissues are normal. No fracture. No epidural or paraspinal hematoma.  IMPRESSION: CT HEAD IMPRESSION  No acute intracranial abnormality.  CT CERVICAL SPINE IMPRESSION  Degenerative changes. No acute findings.   Electronically Signed   By: Charlett Nose M.D.   On: 07/20/2013 23:05   Ct Cervical Spine Wo  Contrast  07/20/2013   CLINICAL DATA:  Fall, hit head.  On Coumadi.  EXAM: CT HEAD WITHOUT CONTRAST  CT CERVICAL SPINE WITHOUT CONTRAST  TECHNIQUE: Multidetector CT imaging of the head and cervical spine was performed following the standard protocol without intravenous contrast. Multiplanar CT image reconstructions of the cervical spine were also generated.  COMPARISON:  02/23/2013  FINDINGS: CT HEAD FINDINGS  No acute intracranial abnormality. Specifically, no hemorrhage, hydrocephalus, mass lesion, acute infarction, or significant intracranial injury. No acute calvarial abnormality. Visualized paranasal sinuses and mastoids clear. Orbital soft tissues unremarkable.  CT CERVICAL SPINE FINDINGS  Diffuse degenerative facet disease bilaterally, left greater than right. Degenerative disc disease in the lower cervical spine. Normal alignment. Prevertebral soft tissues are normal. No fracture. No epidural or paraspinal hematoma.  IMPRESSION: CT HEAD IMPRESSION  No acute intracranial abnormality.  CT CERVICAL SPINE IMPRESSION  Degenerative changes. No acute findings.   Electronically Signed   By: Charlett Nose M.D.   On: 07/20/2013 23:05    MDM  No diagnosis found. 9:54 PM Pt is a 72 y.o. female with pertinent PMHX of afib on coumadin, HTN, DM, COPD, OA, who presents to the ED with fall. Pt sustained head injury at 9 PM. Negative LOC. No Change in behavior. No Vomiting. No Hematoma to and no pain. Denies blurred vision, double vision. Pt was attempting to sit on a chair when it rolled back and pt hit her head on tile floor. Pt on coumadin. No missed doses. No syncope or lightheaded prior to fall. Pt currently denies pain.  By Loreli Slot Rule CT indicated at this time.  On exam: AFVSS.  Neurologic exam: no hemotympanum, CN I-XII: grossly intact, Sensation: normal in upper and lower extremities,  Strength 5/5 in both upper and lower extremities, Coordination intact. Gait normal.  Plan for: CT head wo contrast, Ct  cervical spine wo contrast and PT/INR.  Review of labs: INR 1.79, pt has not had evening dose of coumadin. If CT head/Ct cervical spine negative plan for discharge. Pt will have daughter spend the night to monitor pt.  CT head negative for intracranial hemorrhage. CT cervical spine negative for acute fracture or dislocation.  11:17 PM: I have discussed the diagnosis/risks/treatment options with the patient and believe the pt to be eligible for discharge home to follow-up with Coumadin clinic Monday . We also discussed returning to the ED immediately if new or worsening sx occur. We discussed the sx which are most concerning (e.g., unresponsiveness, increased ICP) that necessitate immediate return. Any new prescriptions provided to the patient are listed below.   New Prescriptions   No medications on file    The patient appears reasonably screened and/or stabilized for discharge and I doubt any other medical condition or other [facility] requiring further screening, evaluation or treatment in the ED at this time prior to discharge . Pt in agreement with discharge plan. Return precautions given. Pt discharged VSS  Labs and imaging reviewed by myself and considered in medical decision making if ordered.  Imaging interpreted by radiology. Pt was discussed with my attending, Dr. Lowella Petties, MD 07/20/13 [phone]

## 2013-07-21 NOTE — ED Provider Notes (Signed)
I saw and evaluated the patient, reviewed the resident's note and I agree with the findings and plan.   Candyce Churn, MD 07/21/13 1101

## 2013-07-21 NOTE — ED Provider Notes (Signed)
I saw and evaluated the patient, reviewed the resident's note and I agree with the findings and plan.  72 yo female on coumadin for a fib who struck back of head when chair rolled away from her.  On exam, mild tenderness to posterior skull, no cervical spine tenderness nor pain with ROM.  CT imaging negative.  Discharged home.  She states her daughter will come stay with her tonight.    Clinical Impression: 1. Closed head injury, initial encounter   2. Fall, initial encounter   3. Head contusion, initial encounter       Candyce Churn, MD 07/21/13 1101

## 2013-07-22 ENCOUNTER — Ambulatory Visit (INDEPENDENT_AMBULATORY_CARE_PROVIDER_SITE_OTHER): Payer: Medicare Other | Admitting: *Deleted

## 2013-07-22 DIAGNOSIS — R899 Unspecified abnormal finding in specimens from other organs, systems and tissues: Secondary | ICD-10-CM

## 2013-07-22 DIAGNOSIS — R6889 Other general symptoms and signs: Secondary | ICD-10-CM

## 2013-07-22 DIAGNOSIS — I4891 Unspecified atrial fibrillation: Secondary | ICD-10-CM

## 2013-07-22 LAB — POCT INR: INR: 1.8

## 2013-07-23 ENCOUNTER — Encounter (HOSPITAL_COMMUNITY)
Admission: RE | Admit: 2013-07-23 | Discharge: 2013-07-23 | Disposition: A | Discharge status: Home / Self Care | Payer: Self-pay | Source: Ambulatory Visit | Attending: Internal Medicine | Admitting: Internal Medicine

## 2013-07-25 ENCOUNTER — Encounter (HOSPITAL_COMMUNITY): Payer: Medicare Other

## 2013-07-30 ENCOUNTER — Encounter (HOSPITAL_COMMUNITY)
Admission: RE | Admit: 2013-07-30 | Discharge: 2013-07-30 | Disposition: A | Discharge status: Home / Self Care | Payer: Self-pay | Source: Ambulatory Visit | Attending: Internal Medicine | Admitting: Internal Medicine

## 2013-08-01 ENCOUNTER — Encounter (HOSPITAL_COMMUNITY): Payer: Medicare Other

## 2013-08-05 ENCOUNTER — Other Ambulatory Visit: Payer: Self-pay | Admitting: Cardiology

## 2013-08-06 ENCOUNTER — Encounter (HOSPITAL_COMMUNITY)
Admission: RE | Admit: 2013-08-06 | Discharge: 2013-08-06 | Disposition: A | Discharge status: Home / Self Care | Payer: Self-pay | Source: Ambulatory Visit | Attending: Internal Medicine | Admitting: Internal Medicine

## 2013-08-08 ENCOUNTER — Encounter (HOSPITAL_COMMUNITY)
Admission: RE | Admit: 2013-08-08 | Discharge: 2013-08-08 | Disposition: A | Discharge status: Home / Self Care | Payer: Self-pay | Source: Ambulatory Visit | Attending: Internal Medicine | Admitting: Internal Medicine

## 2013-08-08 DIAGNOSIS — I428 Other cardiomyopathies: Secondary | ICD-10-CM | POA: Insufficient documentation

## 2013-08-08 DIAGNOSIS — I4891 Unspecified atrial fibrillation: Secondary | ICD-10-CM | POA: Insufficient documentation

## 2013-08-08 DIAGNOSIS — I119 Hypertensive heart disease without heart failure: Secondary | ICD-10-CM | POA: Insufficient documentation

## 2013-08-08 DIAGNOSIS — E785 Hyperlipidemia, unspecified: Secondary | ICD-10-CM | POA: Insufficient documentation

## 2013-08-08 DIAGNOSIS — E119 Type 2 diabetes mellitus without complications: Secondary | ICD-10-CM | POA: Insufficient documentation

## 2013-08-08 DIAGNOSIS — J4489 Other specified chronic obstructive pulmonary disease: Secondary | ICD-10-CM | POA: Insufficient documentation

## 2013-08-08 DIAGNOSIS — Z5189 Encounter for other specified aftercare: Secondary | ICD-10-CM | POA: Insufficient documentation

## 2013-08-08 DIAGNOSIS — J449 Chronic obstructive pulmonary disease, unspecified: Secondary | ICD-10-CM | POA: Insufficient documentation

## 2013-08-12 ENCOUNTER — Ambulatory Visit (INDEPENDENT_AMBULATORY_CARE_PROVIDER_SITE_OTHER): Payer: Medicare Other | Admitting: Pharmacist

## 2013-08-12 DIAGNOSIS — R6889 Other general symptoms and signs: Secondary | ICD-10-CM

## 2013-08-12 DIAGNOSIS — I4891 Unspecified atrial fibrillation: Secondary | ICD-10-CM

## 2013-08-12 DIAGNOSIS — R899 Unspecified abnormal finding in specimens from other organs, systems and tissues: Secondary | ICD-10-CM

## 2013-08-12 LAB — POCT INR: INR: 2.6

## 2013-08-13 ENCOUNTER — Encounter (HOSPITAL_COMMUNITY): Payer: Medicare Other

## 2013-08-15 ENCOUNTER — Encounter (HOSPITAL_COMMUNITY): Payer: Medicare Other

## 2013-08-20 ENCOUNTER — Encounter (HOSPITAL_COMMUNITY): Payer: Medicare Other

## 2013-08-22 ENCOUNTER — Encounter (HOSPITAL_COMMUNITY)
Admission: RE | Admit: 2013-08-22 | Discharge: 2013-08-22 | Disposition: A | Discharge status: Home / Self Care | Payer: Self-pay | Source: Ambulatory Visit | Attending: Internal Medicine | Admitting: Internal Medicine

## 2013-08-27 ENCOUNTER — Encounter (HOSPITAL_COMMUNITY): Payer: Medicare Other

## 2013-08-29 ENCOUNTER — Ambulatory Visit: Payer: Medicare Other | Admitting: Internal Medicine

## 2013-09-02 ENCOUNTER — Ambulatory Visit (INDEPENDENT_AMBULATORY_CARE_PROVIDER_SITE_OTHER): Payer: Medicare Other | Admitting: Cardiology

## 2013-09-02 ENCOUNTER — Encounter: Payer: Self-pay | Admitting: Cardiology

## 2013-09-02 VITALS — BP 140/70 | HR 110 | Ht 64.0 in | Wt 204.0 lb

## 2013-09-02 DIAGNOSIS — J069 Acute upper respiratory infection, unspecified: Secondary | ICD-10-CM | POA: Insufficient documentation

## 2013-09-02 DIAGNOSIS — E785 Hyperlipidemia, unspecified: Secondary | ICD-10-CM

## 2013-09-02 DIAGNOSIS — E119 Type 2 diabetes mellitus without complications: Secondary | ICD-10-CM

## 2013-09-02 DIAGNOSIS — R0981 Nasal congestion: Secondary | ICD-10-CM

## 2013-09-02 DIAGNOSIS — E7800 Pure hypercholesterolemia, unspecified: Secondary | ICD-10-CM

## 2013-09-02 DIAGNOSIS — I4891 Unspecified atrial fibrillation: Secondary | ICD-10-CM

## 2013-09-02 DIAGNOSIS — J3489 Other specified disorders of nose and nasal sinuses: Secondary | ICD-10-CM

## 2013-09-02 LAB — BASIC METABOLIC PANEL
BUN: 20 mg/dL (ref 6–23)
Calcium: 9.6 mg/dL (ref 8.4–10.5)
Chloride: 101 mEq/L (ref 96–112)
Creatinine, Ser: 0.9 mg/dL (ref 0.4–1.2)

## 2013-09-02 LAB — HEPATIC FUNCTION PANEL
ALT: 62 U/L — ABNORMAL HIGH (ref 0–35)
AST: 127 U/L — ABNORMAL HIGH (ref 0–37)
Alkaline Phosphatase: 78 U/L (ref 39–117)
Bilirubin, Direct: 0 mg/dL (ref 0.0–0.3)
Total Protein: 7.3 g/dL (ref 6.0–8.3)

## 2013-09-02 LAB — LIPID PANEL
Cholesterol: 190 mg/dL (ref 0–200)
LDL Cholesterol: 111 mg/dL — ABNORMAL HIGH (ref 0–99)
Triglycerides: 88 mg/dL (ref 0.0–149.0)

## 2013-09-02 MED ORDER — AZITHROMYCIN 250 MG PO TABS: ORAL_TABLET | ORAL | Status: DC

## 2013-09-02 NOTE — Assessment & Plan Note (Signed)
Patient has a history of hyperlipidemia.  We're checking lab work today.  Since last visit she has gained 3 pounds.  She is going to work harder on her diet

## 2013-09-02 NOTE — Progress Notes (Signed)
Renee Ray Date of Birth:  02-19-1941 [address] [place], [place]  [address] [phone]  Fax   [phone]  HPI: This pleasant 72 year old woman is seen for a scheduled four-month followup office visit. She has a history of diabetes. Her diabetes is followed by [name]. She has a history of high blood pressure and a history of established chronic atrial fibrillation. She had a remote attempt at cardioversion which was unsuccessful. She has not been having any symptoms from her atrial fibrillation. He also has a history of asthma and is followed by pulmonary. She participates in the pulmonary rehabilitation program.  Since last visit she has been feeling well.  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  She also goes to a gym and worked with a Systems analyst twice a week.  She still works full time out at [employer] on the second shift which is from 1:30 PM until 10 PM.   Current Outpatient Prescriptions  Medication Sig Dispense Refill  . albuterol (PROVENTIL HFA;VENTOLIN HFA) 108 (90 BASE) MCG/ACT inhaler Inhale 2 puffs into the lungs every 4 (four) hours as needed for wheezing or shortness of breath.      [name] aspirin 81 MG tablet Take 81 mg by mouth daily.       [name] atorvastatin (LIPITOR) 80 MG tablet Take 1 tablet (80 mg total) by mouth daily.  30 tablet  9  . b complex vitamins capsule Take 1 capsule by mouth daily.       . calcium carbonate (OS-CAL) 600 MG TABS Take 600 mg by mouth daily.       . cholecalciferol (VITAMIN D) 1000 UNITS tablet Take 1,000 Units by mouth daily.       . furosemide (LASIX) 40 MG tablet Take 1 tablet (40 mg total) by mouth daily.  90 tablet  1  . glucose blood test strip as directed.      . insulin glargine (LANTUS) 100 UNIT/ML injection Inject 40 Units into the skin at bedtime.       [name] losartan-hydrochlorothiazide (HYZAAR) 100-25 MG per tablet Take 1 tablet by mouth daily.      .  Multiple Vitamin (MULTIVITAMIN) tablet Take 1 tablet by mouth daily.       . Omega-3 Fatty Acids (FISH OIL PO) Take 10 mLs by mouth daily.      . potassium chloride SA (K-DUR,KLOR-CON) 20 MEQ tablet Take 20 mEq by mouth daily.      . potassium chloride SA (K-DUR,KLOR-CON) 20 MEQ tablet Take 1 tablet (20 mEq total) by mouth daily.  30 tablet  3  . sitaGLIPtan-metformin (JANUMET) 50-1000 MG per tablet Take 1 tablet by mouth daily.      [name] tiotropium (SPIRIVA) 18 MCG inhalation capsule Place 1 capsule (18 mcg total) into inhaler and inhale every morning.  30 capsule  5  . warfarin (COUMADIN) 5 MG tablet Take 5-10 mg by mouth daily. Patient takes 10mg  on Mondays --  5 mg on all other days       No current facility-administered medications for this visit.    Allergies  Allergen Reactions  . Amoxicillin Diarrhea  . Sulfonamide Derivatives Other (See Comments)    REACTION: thrush    Patient Active Problem List   Diagnosis Date Noted  . Heel spur 09/06/2012  . Acute medial meniscal tear 03/02/2012  . Hyperlipidemia 08/24/2011  . Benign hypertensive heart disease without heart failure  05/16/2011  . Asthma with COPD 09/03/2009  . DIABETES MELLITUS 09/04/2008  . CARDIOMYOPATHY 09/04/2008  . ATRIAL FIBRILLATION 09/04/2008  . ASTHMATIC BRONCHITIS, ACUTE 09/04/2008    History  Smoking status  . Former Smoker -- 20 years  . Types: Cigarettes  . Quit date: 05/05/1996  Smokeless tobacco  . Never Used    History  Alcohol Use No    Family History  Problem Relation Age of Onset  . Arrhythmia Mother   . Heart failure Mother   . Stroke Mother   . Heart attack Father   . Diabetes Brother   . Heart disease Brother     Review of Systems: The patient denies any heat or cold intolerance.  No weight gain or weight loss.  The patient denies headaches or blurry vision.  There is no cough or sputum production.  The patient denies dizziness.  There is no hematuria or hematochezia.  The patient  denies any muscle aches or arthritis.  The patient denies any rash.  The patient denies frequent falling or instability.  There is no history of depression or anxiety.  All other systems were reviewed and are negative.   Physical Exam: Filed Vitals:   09/02/13 0952  BP: 140/70  Pulse: 110   the general appearance reveals a well-developed well-nourished woman in no distress.  Weight is up 2 pounds since last visit.The head and neck exam reveals pupils equal and reactive.  Extraocular movements are full.  There is no scleral icterus.  The mouth and pharynx are normal.  The neck is supple.  The carotids reveal no bruits.  The jugular venous pressure is normal.  The  thyroid is not enlarged.  There is no lymphadenopathy.  The chest is clear to percussion and auscultation.  There are no rales or rhonchi.  Expansion of the chest is symmetrical.  The precordium is quiet.  The pulse is irregularly irregular The first heart sound is normal.  The second heart sound is physiologically split.  There is no murmur gallop rub or click.  There is no abnormal lift or heave.  The abdomen is soft and nontender.  The bowel sounds are normal.  The liver and spleen are not enlarged.  There are no abdominal masses.  There are no abdominal bruits.  Extremities reveal good pedal pulses.  There is no phlebitis or edema.  There is no cyanosis or clubbing.  Strength is normal and symmetrical in all extremities.  There is no lateralizing weakness.  There are no sensory deficits.  The skin is warm and dry.  There is no rash.      Assessment / Plan:  Continue on same medication.  Careful diet.  Blood work today is pending.  Recheck in 4 months for office visit lipid panel hepatic function panel and basal metabolic panel.  We will give her a prescription for Zithromax Z-Pak to have on hand if symptoms of upper respiratory infection worsen.

## 2013-09-02 NOTE — Patient Instructions (Signed)

## 2013-09-02 NOTE — Assessment & Plan Note (Signed)
The patient remains on long-term Coumadin for her atrial fibrillation.  He has not been having a TIA symptoms.  Her symptoms of CHF.  No chest pain or angina the

## 2013-09-02 NOTE — Progress Notes (Signed)
Quick Note:  Please report to patient. The recent labs show cholesterol higher. TGs are okay. Two of LFTs are much higher this time probably fatty liver from weight gain and diabetes. I don't think she drinks alcohol but if she does she should cut way back. Work very hard on diet and weight loss. Kidney function is normal. Because of jump in LFTs we should have her decrease Lipitor to just 40 mg daily. ______

## 2013-09-02 NOTE — Assessment & Plan Note (Signed)
The patient has a upper respiratory infection with coryza.  No fever or chills or sputum production at this point.  She will have a Z-Pak to have on hand if symptoms worsen

## 2013-09-03 ENCOUNTER — Other Ambulatory Visit: Payer: Self-pay | Admitting: *Deleted

## 2013-09-03 ENCOUNTER — Encounter (HOSPITAL_COMMUNITY)
Admission: RE | Admit: 2013-09-03 | Discharge: 2013-09-03 | Disposition: A | Discharge status: Home / Self Care | Payer: Self-pay | Source: Ambulatory Visit | Attending: Internal Medicine | Admitting: Internal Medicine

## 2013-09-03 ENCOUNTER — Telehealth: Payer: Self-pay | Admitting: Cardiology

## 2013-09-03 NOTE — Telephone Encounter (Signed)
Message copied by Burnell Blanks on Tue Sep 03, 2013  1:48 PM ------      Message from: Cassell Clement      Created: Mon Sep 02, 2013  8:31 PM       Please report to patient.  The recent labs show cholesterol higher. TGs are okay. Two of LFTs are much higher this time probably fatty liver from weight gain and diabetes. I don't think she drinks alcohol but if she does she should cut way back. Work very hard on diet and weight loss. Kidney function is normal.      Because of jump in LFTs we should have her decrease Lipitor to just 40 mg daily. ------

## 2013-09-03 NOTE — Telephone Encounter (Signed)
Spoke with patient regarding labs. Patient actually has bee having a lot of joint pain and concerned coming from Lipitor. Discussed with  [name] and will have patient hold Lipitor for 1 month and recheck labs. Patient will also let us know about joint pain at that time. Patient verbalized understanding.

## 2013-09-03 NOTE — Telephone Encounter (Signed)
New Problem:  Pt states she is returning Melinda's call from the other day. Pt states she believes it's in regards to blood work. Please advise

## 2013-09-05 ENCOUNTER — Ambulatory Visit (INDEPENDENT_AMBULATORY_CARE_PROVIDER_SITE_OTHER): Payer: Medicare Other | Admitting: Internal Medicine

## 2013-09-05 ENCOUNTER — Ambulatory Visit (INDEPENDENT_AMBULATORY_CARE_PROVIDER_SITE_OTHER)
Admission: RE | Admit: 2013-09-05 | Discharge: 2013-09-05 | Disposition: A | Discharge status: Home / Self Care | Payer: Medicare Other | Source: Ambulatory Visit | Attending: Internal Medicine | Admitting: Internal Medicine

## 2013-09-05 ENCOUNTER — Encounter: Payer: Self-pay | Admitting: Internal Medicine

## 2013-09-05 VITALS — BP 120/78 | HR 95 | Ht 64.0 in | Wt 204.0 lb

## 2013-09-05 DIAGNOSIS — J449 Chronic obstructive pulmonary disease, unspecified: Secondary | ICD-10-CM

## 2013-09-05 DIAGNOSIS — J209 Acute bronchitis, unspecified: Secondary | ICD-10-CM

## 2013-09-05 DIAGNOSIS — J4489 Other specified chronic obstructive pulmonary disease: Secondary | ICD-10-CM

## 2013-09-05 DIAGNOSIS — Z23 Encounter for immunization: Secondary | ICD-10-CM

## 2013-09-05 MED ORDER — FLUTICASONE FUROATE-VILANTEROL 100-25 MCG/INH IN AEPB: 1.0000 | INHALATION_SPRAY | Freq: Every day | RESPIRATORY_TRACT | Status: DC

## 2013-09-05 NOTE — Progress Notes (Signed)
Quick Note:  Advised pt of cxr results per CY. Pt verbalized understanding and has no further questions ______ 

## 2013-09-05 NOTE — Progress Notes (Signed)
09/01/11- 72 year old female former smoker followed for asthmatic bronchitis complicated by DM, CM, atrial fibrillation. Widow of Dr. Molly Maduro Rutan/neurosurgeon. She continues to work part-time at [employer]. Has had flu vaccine. Has had pneumonia vaccine. Post hospital visit now after episode of syncope with rhabdomyolysis. She feels back to normal and is able to work and to get around with her usual activities. She now has a Medic alert device. Had renal insufficiency which is being tracked by Dr.Kerr, who follows up for diabetes. He has seen her and reports stable renal and liver function. She denies cough, wheeze, chest pain, palpitation or swelling. CXR 08/25/2011-mild CE, hyperinflation, bronchitis with some atelectasis left base.  10/27/11-72 year old female former smoker followed for asthmatic bronchitis complicated by DM, CM, atrial fibrillation. Widow of Dr. Molly Maduro Philbrook/neurosurgeon. She continues to work part-time at [employer]. Has had flu vaccine. Has had pneumonia vaccine. Malaise yesterday. Today woke w/ temp 101.4, cough productive green. Nose blowing clear. Scratchy throat. No muscle ache.  PFT-10/05/11- moderate COPD- FEV1/FVC0.61, DLCO 74%. She wants to re-do pulmonary rehab.  02/23/12- 72 year old female former smoker followed for asthmatic bronchitis complicated by DM, CM, atrial fibrillation. Widow of Dr. Molly Maduro Vassey/neurosurgeon. She continues to work part-time at [employer]. :"doing good overall"; recently had bronchitis-took Zpak and round of Prednisone and helped clear it up Not bothered by spring pollens. Satisfied with Spiriva and rare use of Proair.Back in Pulmonary Rehab and says it helps her.  For R knee arthroscopy with no pulmonary issues anticipated.  IMPRESSION: CXR 11/02/11 Stable. Emphysema without acute findings.  Original Report Authenticated By: ERIC A. MANSELL, M.D.   08/23/12- 72 year old female former smoker followed for asthmatic  bronchitis complicated by DM, CM, atrial fibrillation. Widow of Dr. Molly Maduro Stankus/neurosurgeon Denies any flare ups; no SOB or wheezing as well as no cough or congestion. She says she is head "no issues at all". Has had flu vaccine. Continues pulmonary rehabilitation. She is pleased with her status.  09/05/13- 72 year old female former smoker followed for asthmatic bronchitis complicated by DM, CM, atrial fibrillation. Widow of Dr. Molly Maduro Capley/neurosurgeon FOLLOWS FOR:  Increased SOB x2 weeks.  Presently with sinus infection and possibly bronchitis. Dr Patty Sermons gave Z-Pak to hold. She definitely caught this at work. Denies fever. Feels congestion still in head and chest. She wants to wait on Z-Pak until her Coumadin checked next week. Otherwise has done well. Continues pulmonary rehabilitation and also works with a Psychologist, educational at [employer]  ROS-see HPI Constitutional:   No-   weight loss, night sweats, +fevers, chills, fatigue, lassitude. HEENT:   No-  headaches, difficulty swallowing, tooth/dental problems, sore throat,       No-  sneezing, itching, ear ache, +nasal congestion, post nasal drip,  CV:  No-   chest pain, orthopnea, PND, swelling in lower extremities, anasarca, dizziness, palpitations Resp:  +Some shortness of breath with exertion or at rest.              +productive cough,  No non-productive cough,  No- coughing up of blood.              No-   change in color of mucus.  + wheezing.   Skin: No-   rash or lesions. GI:  No-   heartburn, indigestion, abdominal pain, nausea, vomiting,  GU: MS:  No-   joint pain or swelling.   Neuro-     nothing unusual Psych:  No- change in mood or affect. No depression or anxiety.  No memory loss.  OBJ General- Alert, Oriented, Affect-appropriate, Distress- none acute.  Skin- rash-none, lesions- none, excoriation- none; warm  Lymphadenopathy- none Head- atraumatic            Eyes- Gross vision intact, PERRLA, conjunctivae clear secretions             Ears- Hearing, canals-normal            Nose- Clear, no-Septal dev, mucus, polyps, erosion, perforation             Throat- Mallampati II , mucosa red , drainage- none, tonsils- atrophic Neck- flexible , trachea midline, no stridor , thyroid nl, carotid no bruit Chest - symmetrical excursion , unlabored           Heart/CV- AF/ IRR , no murmur , no gallop  , no rub, nl s1 s2                           - JVD- none , edema- none, stasis changes- none, varices- none           Lung- + sounds are a little distant, wheeze+ mild, unlabored, cough- none , dullness-none, rub- none           Chest wall-  Abd- Br/ Gen/ Rectal- Not done, not indicated Extrem- cyanosis- none, clubbing, none, atrophy- none, strength- nl Neuro- grossly intact to observation

## 2013-09-05 NOTE — Patient Instructions (Signed)
Pneumococcal conjugate Prevnar 13   Order- CXR  Dx asthma with COPD  Sample Breo Ellipta to add for now   One puff, then rinse mouth, ONE TIME DAILY   When you finish the sample, just stop it.

## 2013-09-07 ENCOUNTER — Other Ambulatory Visit: Payer: Self-pay | Admitting: Cardiology

## 2013-09-09 ENCOUNTER — Ambulatory Visit (INDEPENDENT_AMBULATORY_CARE_PROVIDER_SITE_OTHER): Payer: Medicare Other | Admitting: General Practice

## 2013-09-09 DIAGNOSIS — I4891 Unspecified atrial fibrillation: Secondary | ICD-10-CM

## 2013-09-09 DIAGNOSIS — R899 Unspecified abnormal finding in specimens from other organs, systems and tissues: Secondary | ICD-10-CM

## 2013-09-09 DIAGNOSIS — R6889 Other general symptoms and signs: Secondary | ICD-10-CM

## 2013-09-09 LAB — POCT INR: INR: 2.8

## 2013-09-10 ENCOUNTER — Encounter (HOSPITAL_COMMUNITY)
Admission: RE | Admit: 2013-09-10 | Discharge: 2013-09-10 | Disposition: A | Discharge status: Home / Self Care | Payer: Worker's Compensation | Source: Ambulatory Visit | Attending: Internal Medicine | Admitting: Internal Medicine

## 2013-09-10 DIAGNOSIS — I119 Hypertensive heart disease without heart failure: Secondary | ICD-10-CM | POA: Insufficient documentation

## 2013-09-10 DIAGNOSIS — I4891 Unspecified atrial fibrillation: Secondary | ICD-10-CM | POA: Insufficient documentation

## 2013-09-10 DIAGNOSIS — E119 Type 2 diabetes mellitus without complications: Secondary | ICD-10-CM | POA: Insufficient documentation

## 2013-09-10 DIAGNOSIS — J4489 Other specified chronic obstructive pulmonary disease: Secondary | ICD-10-CM | POA: Insufficient documentation

## 2013-09-10 DIAGNOSIS — J449 Chronic obstructive pulmonary disease, unspecified: Secondary | ICD-10-CM | POA: Insufficient documentation

## 2013-09-10 DIAGNOSIS — I428 Other cardiomyopathies: Secondary | ICD-10-CM | POA: Insufficient documentation

## 2013-09-10 DIAGNOSIS — E785 Hyperlipidemia, unspecified: Secondary | ICD-10-CM | POA: Insufficient documentation

## 2013-09-10 DIAGNOSIS — Z5189 Encounter for other specified aftercare: Secondary | ICD-10-CM | POA: Insufficient documentation

## 2013-09-12 ENCOUNTER — Encounter (HOSPITAL_COMMUNITY)
Admission: RE | Admit: 2013-09-12 | Discharge: 2013-09-12 | Disposition: A | Discharge status: Home / Self Care | Payer: Worker's Compensation | Source: Ambulatory Visit | Attending: Internal Medicine | Admitting: Internal Medicine

## 2013-09-17 ENCOUNTER — Encounter (HOSPITAL_COMMUNITY): Payer: Worker's Compensation

## 2013-09-19 ENCOUNTER — Encounter (HOSPITAL_COMMUNITY)
Admission: RE | Admit: 2013-09-19 | Discharge: 2013-09-19 | Disposition: A | Discharge status: Home / Self Care | Payer: Worker's Compensation | Source: Ambulatory Visit | Attending: Internal Medicine | Admitting: Internal Medicine

## 2013-09-22 NOTE — Assessment & Plan Note (Signed)
Acute upper respiratory infection with bronchitis, probably viral Plan-fluids, rest. Okay to hold Z-Pak-discussed. Sample Breo. CXR, pneumonia conjugate vaccine-13

## 2013-09-24 ENCOUNTER — Encounter (HOSPITAL_COMMUNITY): Payer: Worker's Compensation

## 2013-09-26 ENCOUNTER — Encounter (HOSPITAL_COMMUNITY)
Admission: RE | Admit: 2013-09-26 | Discharge: 2013-09-26 | Disposition: A | Discharge status: Home / Self Care | Payer: Worker's Compensation | Source: Ambulatory Visit | Attending: Internal Medicine | Admitting: Internal Medicine

## 2013-10-01 ENCOUNTER — Encounter (HOSPITAL_COMMUNITY)
Admission: RE | Admit: 2013-10-01 | Discharge: 2013-10-01 | Disposition: A | Discharge status: Home / Self Care | Payer: Worker's Compensation | Source: Ambulatory Visit | Attending: Internal Medicine | Admitting: Internal Medicine

## 2013-10-02 ENCOUNTER — Ambulatory Visit (INDEPENDENT_AMBULATORY_CARE_PROVIDER_SITE_OTHER): Payer: Medicare Other | Admitting: *Deleted

## 2013-10-02 DIAGNOSIS — R899 Unspecified abnormal finding in specimens from other organs, systems and tissues: Secondary | ICD-10-CM

## 2013-10-02 DIAGNOSIS — R7989 Other specified abnormal findings of blood chemistry: Secondary | ICD-10-CM

## 2013-10-02 DIAGNOSIS — I4891 Unspecified atrial fibrillation: Secondary | ICD-10-CM

## 2013-10-02 DIAGNOSIS — E1149 Type 2 diabetes mellitus with other diabetic neurological complication: Secondary | ICD-10-CM

## 2013-10-02 DIAGNOSIS — R6889 Other general symptoms and signs: Secondary | ICD-10-CM

## 2013-10-02 LAB — HEPATIC FUNCTION PANEL
AST: 32 U/L (ref 0–37)
Alkaline Phosphatase: 78 U/L (ref 39–117)
Bilirubin, Direct: 0 mg/dL (ref 0.0–0.3)
Total Protein: 7.2 g/dL (ref 6.0–8.3)

## 2013-10-02 LAB — MICROALBUMIN / CREATININE URINE RATIO
Creatinine,U: 7.1 mg/dL
Microalb Creat Ratio: 1.4 mg/g (ref 0.0–30.0)
Microalb, Ur: 0.1 mg/dL (ref 0.0–1.9)

## 2013-10-02 LAB — POCT INR: INR: 2.4

## 2013-10-02 NOTE — Progress Notes (Signed)
Quick Note:  Please report to patient. The LFTs have returned to normal. The elevation was probably from the high dose 80 mg lipitor. I would like her to try a very low dose of crestor 5 mg daily ______

## 2013-10-03 ENCOUNTER — Encounter (HOSPITAL_COMMUNITY): Payer: Worker's Compensation

## 2013-10-08 ENCOUNTER — Encounter (HOSPITAL_COMMUNITY)
Admission: RE | Admit: 2013-10-08 | Discharge: 2013-10-08 | Disposition: A | Discharge status: Home / Self Care | Payer: Self-pay | Source: Ambulatory Visit | Attending: Internal Medicine | Admitting: Internal Medicine

## 2013-10-08 DIAGNOSIS — E119 Type 2 diabetes mellitus without complications: Secondary | ICD-10-CM | POA: Insufficient documentation

## 2013-10-08 DIAGNOSIS — I4891 Unspecified atrial fibrillation: Secondary | ICD-10-CM | POA: Insufficient documentation

## 2013-10-08 DIAGNOSIS — Z5189 Encounter for other specified aftercare: Secondary | ICD-10-CM | POA: Insufficient documentation

## 2013-10-08 DIAGNOSIS — I119 Hypertensive heart disease without heart failure: Secondary | ICD-10-CM | POA: Insufficient documentation

## 2013-10-08 DIAGNOSIS — I428 Other cardiomyopathies: Secondary | ICD-10-CM | POA: Insufficient documentation

## 2013-10-08 DIAGNOSIS — J449 Chronic obstructive pulmonary disease, unspecified: Secondary | ICD-10-CM | POA: Insufficient documentation

## 2013-10-08 DIAGNOSIS — E785 Hyperlipidemia, unspecified: Secondary | ICD-10-CM | POA: Insufficient documentation

## 2013-10-08 DIAGNOSIS — J4489 Other specified chronic obstructive pulmonary disease: Secondary | ICD-10-CM | POA: Insufficient documentation

## 2013-10-09 ENCOUNTER — Telehealth: Payer: Self-pay | Admitting: *Deleted

## 2013-10-09 DIAGNOSIS — E785 Hyperlipidemia, unspecified: Secondary | ICD-10-CM

## 2013-10-09 MED ORDER — ROSUVASTATIN CALCIUM 5 MG PO TABS: 5.0000 mg | ORAL_TABLET | Freq: Every day | ORAL | Status: DC

## 2013-10-09 NOTE — Telephone Encounter (Signed)
Message copied by Burnell Blanks on Wed Oct 09, 2013  9:16 AM ------      Message from: Cassell Clement      Created: Wed Oct 02, 2013  8:32 PM       Please report to patient.  The LFTs have returned to normal. The elevation was probably from the high dose 80 mg lipitor.  I would like her to try a very low dose of crestor 5 mg daily ------

## 2013-10-09 NOTE — Telephone Encounter (Signed)
Advised patient of lab results and medication change  

## 2013-10-10 ENCOUNTER — Encounter (HOSPITAL_COMMUNITY): Payer: Self-pay

## 2013-10-15 ENCOUNTER — Encounter (HOSPITAL_COMMUNITY)
Admission: RE | Admit: 2013-10-15 | Discharge: 2013-10-15 | Disposition: A | Discharge status: Home / Self Care | Payer: Self-pay | Source: Ambulatory Visit | Attending: Internal Medicine | Admitting: Internal Medicine

## 2013-10-17 ENCOUNTER — Encounter (HOSPITAL_COMMUNITY): Payer: Self-pay

## 2013-10-22 ENCOUNTER — Encounter (HOSPITAL_COMMUNITY)
Admission: RE | Admit: 2013-10-22 | Discharge: 2013-10-22 | Disposition: A | Discharge status: Home / Self Care | Payer: Self-pay | Source: Ambulatory Visit | Attending: Internal Medicine | Admitting: Internal Medicine

## 2013-10-24 ENCOUNTER — Encounter (HOSPITAL_COMMUNITY): Payer: Self-pay

## 2013-10-29 ENCOUNTER — Encounter (HOSPITAL_COMMUNITY): Payer: Self-pay

## 2013-10-31 ENCOUNTER — Encounter (HOSPITAL_COMMUNITY): Payer: Self-pay

## 2013-11-03 ENCOUNTER — Other Ambulatory Visit: Payer: Self-pay | Admitting: Internal Medicine

## 2013-11-05 ENCOUNTER — Encounter (HOSPITAL_COMMUNITY)
Admission: RE | Admit: 2013-11-05 | Discharge: 2013-11-05 | Disposition: A | Discharge status: Home / Self Care | Payer: Self-pay | Source: Ambulatory Visit | Attending: Internal Medicine | Admitting: Internal Medicine

## 2013-11-07 ENCOUNTER — Encounter (HOSPITAL_COMMUNITY): Payer: Self-pay

## 2013-11-07 DIAGNOSIS — I4891 Unspecified atrial fibrillation: Secondary | ICD-10-CM | POA: Insufficient documentation

## 2013-11-07 DIAGNOSIS — J4489 Other specified chronic obstructive pulmonary disease: Secondary | ICD-10-CM | POA: Insufficient documentation

## 2013-11-07 DIAGNOSIS — J449 Chronic obstructive pulmonary disease, unspecified: Secondary | ICD-10-CM | POA: Insufficient documentation

## 2013-11-07 DIAGNOSIS — Z5189 Encounter for other specified aftercare: Secondary | ICD-10-CM | POA: Insufficient documentation

## 2013-11-07 DIAGNOSIS — E119 Type 2 diabetes mellitus without complications: Secondary | ICD-10-CM | POA: Insufficient documentation

## 2013-11-07 DIAGNOSIS — I119 Hypertensive heart disease without heart failure: Secondary | ICD-10-CM | POA: Insufficient documentation

## 2013-11-07 DIAGNOSIS — E785 Hyperlipidemia, unspecified: Secondary | ICD-10-CM | POA: Insufficient documentation

## 2013-11-07 DIAGNOSIS — I428 Other cardiomyopathies: Secondary | ICD-10-CM | POA: Insufficient documentation

## 2013-11-08 ENCOUNTER — Telehealth: Payer: Self-pay

## 2013-11-08 ENCOUNTER — Ambulatory Visit (INDEPENDENT_AMBULATORY_CARE_PROVIDER_SITE_OTHER): Payer: Medicare Other | Admitting: *Deleted

## 2013-11-08 DIAGNOSIS — I4891 Unspecified atrial fibrillation: Secondary | ICD-10-CM

## 2013-11-08 DIAGNOSIS — R6889 Other general symptoms and signs: Secondary | ICD-10-CM

## 2013-11-08 DIAGNOSIS — R899 Unspecified abnormal finding in specimens from other organs, systems and tissues: Secondary | ICD-10-CM

## 2013-11-08 LAB — POCT INR: INR: 2.4

## 2013-11-08 NOTE — Telephone Encounter (Signed)
Gave patient samples of crestor 5 mg

## 2013-11-12 ENCOUNTER — Encounter (HOSPITAL_COMMUNITY)
Admission: RE | Admit: 2013-11-12 | Discharge: 2013-11-12 | Disposition: A | Discharge status: Home / Self Care | Payer: Self-pay | Source: Ambulatory Visit | Attending: Internal Medicine | Admitting: Internal Medicine

## 2013-11-14 ENCOUNTER — Encounter (HOSPITAL_COMMUNITY): Payer: Self-pay

## 2013-11-19 ENCOUNTER — Encounter (HOSPITAL_COMMUNITY)
Admission: RE | Admit: 2013-11-19 | Discharge: 2013-11-19 | Disposition: A | Discharge status: Home / Self Care | Payer: Self-pay | Source: Ambulatory Visit | Attending: Internal Medicine | Admitting: Internal Medicine

## 2013-11-21 ENCOUNTER — Encounter (HOSPITAL_COMMUNITY): Payer: Self-pay

## 2013-11-24 ENCOUNTER — Other Ambulatory Visit: Payer: Self-pay | Admitting: Cardiology

## 2013-11-26 ENCOUNTER — Encounter (HOSPITAL_COMMUNITY)
Admission: RE | Admit: 2013-11-26 | Discharge: 2013-11-26 | Disposition: A | Discharge status: Home / Self Care | Payer: Self-pay | Source: Ambulatory Visit | Attending: Internal Medicine | Admitting: Internal Medicine

## 2013-11-28 ENCOUNTER — Encounter (HOSPITAL_COMMUNITY): Payer: Self-pay

## 2013-12-03 ENCOUNTER — Encounter (HOSPITAL_COMMUNITY)
Admission: RE | Admit: 2013-12-03 | Discharge: 2013-12-03 | Disposition: A | Discharge status: Home / Self Care | Payer: Self-pay | Source: Ambulatory Visit | Attending: Internal Medicine | Admitting: Internal Medicine

## 2013-12-05 ENCOUNTER — Encounter (HOSPITAL_COMMUNITY): Payer: Self-pay

## 2013-12-10 ENCOUNTER — Other Ambulatory Visit: Payer: Self-pay | Admitting: Internal Medicine

## 2013-12-10 ENCOUNTER — Encounter (HOSPITAL_COMMUNITY)
Admission: RE | Admit: 2013-12-10 | Discharge: 2013-12-10 | Disposition: A | Discharge status: Home / Self Care | Payer: Self-pay | Source: Ambulatory Visit | Attending: Internal Medicine | Admitting: Internal Medicine

## 2013-12-10 DIAGNOSIS — I4891 Unspecified atrial fibrillation: Secondary | ICD-10-CM | POA: Insufficient documentation

## 2013-12-10 DIAGNOSIS — J449 Chronic obstructive pulmonary disease, unspecified: Secondary | ICD-10-CM | POA: Insufficient documentation

## 2013-12-10 DIAGNOSIS — E785 Hyperlipidemia, unspecified: Secondary | ICD-10-CM | POA: Insufficient documentation

## 2013-12-10 DIAGNOSIS — J4489 Other specified chronic obstructive pulmonary disease: Secondary | ICD-10-CM | POA: Insufficient documentation

## 2013-12-10 DIAGNOSIS — E119 Type 2 diabetes mellitus without complications: Secondary | ICD-10-CM | POA: Insufficient documentation

## 2013-12-10 DIAGNOSIS — I428 Other cardiomyopathies: Secondary | ICD-10-CM | POA: Insufficient documentation

## 2013-12-10 DIAGNOSIS — I119 Hypertensive heart disease without heart failure: Secondary | ICD-10-CM | POA: Insufficient documentation

## 2013-12-10 DIAGNOSIS — Z5189 Encounter for other specified aftercare: Secondary | ICD-10-CM | POA: Insufficient documentation

## 2013-12-12 ENCOUNTER — Encounter (HOSPITAL_COMMUNITY)
Admission: RE | Admit: 2013-12-12 | Discharge: 2013-12-12 | Disposition: A | Discharge status: Home / Self Care | Payer: Self-pay | Source: Ambulatory Visit | Attending: Internal Medicine | Admitting: Internal Medicine

## 2013-12-12 ENCOUNTER — Other Ambulatory Visit: Payer: Self-pay | Admitting: Cardiology

## 2013-12-17 ENCOUNTER — Encounter (HOSPITAL_COMMUNITY): Payer: Self-pay

## 2013-12-19 ENCOUNTER — Encounter (HOSPITAL_COMMUNITY)
Admission: RE | Admit: 2013-12-19 | Discharge: 2013-12-19 | Disposition: A | Discharge status: Home / Self Care | Payer: Self-pay | Source: Ambulatory Visit | Attending: Internal Medicine | Admitting: Internal Medicine

## 2013-12-20 ENCOUNTER — Telehealth: Payer: Self-pay

## 2013-12-20 ENCOUNTER — Ambulatory Visit (INDEPENDENT_AMBULATORY_CARE_PROVIDER_SITE_OTHER): Payer: Medicare Other | Admitting: *Deleted

## 2013-12-20 DIAGNOSIS — R6889 Other general symptoms and signs: Secondary | ICD-10-CM

## 2013-12-20 DIAGNOSIS — R899 Unspecified abnormal finding in specimens from other organs, systems and tissues: Secondary | ICD-10-CM

## 2013-12-20 DIAGNOSIS — I4891 Unspecified atrial fibrillation: Secondary | ICD-10-CM

## 2013-12-20 LAB — POCT INR: INR: 3.2

## 2013-12-20 NOTE — Telephone Encounter (Signed)
Patient was here for coumadian visit and asked for samples of crestor 5 mg

## 2013-12-24 ENCOUNTER — Encounter (HOSPITAL_COMMUNITY): Payer: Self-pay

## 2013-12-26 ENCOUNTER — Encounter (HOSPITAL_COMMUNITY): Payer: Self-pay

## 2013-12-31 ENCOUNTER — Encounter (HOSPITAL_COMMUNITY): Payer: Self-pay

## 2014-01-01 ENCOUNTER — Ambulatory Visit: Payer: Medicare Other | Admitting: Cardiology

## 2014-01-02 ENCOUNTER — Encounter (HOSPITAL_COMMUNITY): Payer: Self-pay

## 2014-01-06 ENCOUNTER — Ambulatory Visit (INDEPENDENT_AMBULATORY_CARE_PROVIDER_SITE_OTHER): Payer: Medicare Other

## 2014-01-06 DIAGNOSIS — I4891 Unspecified atrial fibrillation: Secondary | ICD-10-CM

## 2014-01-06 DIAGNOSIS — R6889 Other general symptoms and signs: Secondary | ICD-10-CM

## 2014-01-06 DIAGNOSIS — Z5181 Encounter for therapeutic drug level monitoring: Secondary | ICD-10-CM | POA: Insufficient documentation

## 2014-01-06 DIAGNOSIS — R899 Unspecified abnormal finding in specimens from other organs, systems and tissues: Secondary | ICD-10-CM

## 2014-01-06 LAB — POCT INR: INR: 3.4

## 2014-01-07 ENCOUNTER — Encounter (HOSPITAL_COMMUNITY)
Admission: RE | Admit: 2014-01-07 | Discharge: 2014-01-07 | Disposition: A | Discharge status: Home / Self Care | Payer: Self-pay | Source: Ambulatory Visit | Attending: Internal Medicine | Admitting: Internal Medicine

## 2014-01-07 DIAGNOSIS — J449 Chronic obstructive pulmonary disease, unspecified: Secondary | ICD-10-CM | POA: Insufficient documentation

## 2014-01-07 DIAGNOSIS — I4891 Unspecified atrial fibrillation: Secondary | ICD-10-CM | POA: Insufficient documentation

## 2014-01-07 DIAGNOSIS — J4489 Other specified chronic obstructive pulmonary disease: Secondary | ICD-10-CM | POA: Insufficient documentation

## 2014-01-07 DIAGNOSIS — I119 Hypertensive heart disease without heart failure: Secondary | ICD-10-CM | POA: Insufficient documentation

## 2014-01-07 DIAGNOSIS — E785 Hyperlipidemia, unspecified: Secondary | ICD-10-CM | POA: Insufficient documentation

## 2014-01-07 DIAGNOSIS — I428 Other cardiomyopathies: Secondary | ICD-10-CM | POA: Insufficient documentation

## 2014-01-07 DIAGNOSIS — Z5189 Encounter for other specified aftercare: Secondary | ICD-10-CM | POA: Insufficient documentation

## 2014-01-07 DIAGNOSIS — E119 Type 2 diabetes mellitus without complications: Secondary | ICD-10-CM | POA: Insufficient documentation

## 2014-01-08 ENCOUNTER — Ambulatory Visit: Payer: Medicare Other | Admitting: Physician Assistant

## 2014-01-09 ENCOUNTER — Encounter (HOSPITAL_COMMUNITY): Payer: Self-pay

## 2014-01-12 ENCOUNTER — Other Ambulatory Visit: Payer: Self-pay | Admitting: Cardiology

## 2014-01-14 ENCOUNTER — Encounter (HOSPITAL_COMMUNITY)
Admission: RE | Admit: 2014-01-14 | Discharge: 2014-01-14 | Disposition: A | Discharge status: Home / Self Care | Payer: Self-pay | Source: Ambulatory Visit | Attending: Internal Medicine | Admitting: Internal Medicine

## 2014-01-16 ENCOUNTER — Encounter (HOSPITAL_COMMUNITY): Payer: Self-pay

## 2014-01-21 ENCOUNTER — Encounter (HOSPITAL_COMMUNITY): Payer: Self-pay

## 2014-01-23 ENCOUNTER — Encounter (HOSPITAL_COMMUNITY): Payer: Self-pay

## 2014-01-28 ENCOUNTER — Encounter (HOSPITAL_COMMUNITY)
Admission: RE | Admit: 2014-01-28 | Discharge: 2014-01-28 | Disposition: A | Discharge status: Home / Self Care | Payer: Self-pay | Source: Ambulatory Visit | Attending: Internal Medicine | Admitting: Internal Medicine

## 2014-01-29 ENCOUNTER — Encounter: Payer: Self-pay | Admitting: Cardiology

## 2014-01-29 ENCOUNTER — Ambulatory Visit (INDEPENDENT_AMBULATORY_CARE_PROVIDER_SITE_OTHER): Payer: Medicare Other | Admitting: Cardiology

## 2014-01-29 ENCOUNTER — Ambulatory Visit (INDEPENDENT_AMBULATORY_CARE_PROVIDER_SITE_OTHER): Payer: Medicare Other | Admitting: *Deleted

## 2014-01-29 VITALS — BP 148/86 | HR 87 | Ht 63.75 in | Wt 203.0 lb

## 2014-01-29 DIAGNOSIS — I119 Hypertensive heart disease without heart failure: Secondary | ICD-10-CM

## 2014-01-29 DIAGNOSIS — E119 Type 2 diabetes mellitus without complications: Secondary | ICD-10-CM

## 2014-01-29 DIAGNOSIS — R7989 Other specified abnormal findings of blood chemistry: Secondary | ICD-10-CM

## 2014-01-29 DIAGNOSIS — R0609 Other forms of dyspnea: Secondary | ICD-10-CM

## 2014-01-29 DIAGNOSIS — R945 Abnormal results of liver function studies: Secondary | ICD-10-CM

## 2014-01-29 DIAGNOSIS — R0989 Other specified symptoms and signs involving the circulatory and respiratory systems: Secondary | ICD-10-CM

## 2014-01-29 DIAGNOSIS — E7800 Pure hypercholesterolemia, unspecified: Secondary | ICD-10-CM

## 2014-01-29 DIAGNOSIS — I4891 Unspecified atrial fibrillation: Secondary | ICD-10-CM

## 2014-01-29 DIAGNOSIS — Z5181 Encounter for therapeutic drug level monitoring: Secondary | ICD-10-CM

## 2014-01-29 DIAGNOSIS — R899 Unspecified abnormal finding in specimens from other organs, systems and tissues: Secondary | ICD-10-CM

## 2014-01-29 DIAGNOSIS — R6889 Other general symptoms and signs: Secondary | ICD-10-CM

## 2014-01-29 LAB — HEPATIC FUNCTION PANEL
ALK PHOS: 79 U/L (ref 39–117)
ALT: 27 U/L (ref 0–35)
AST: 25 U/L (ref 0–37)
Albumin: 3.6 g/dL (ref 3.5–5.2)
BILIRUBIN DIRECT: 0 mg/dL (ref 0.0–0.3)
Total Bilirubin: 0.5 mg/dL (ref 0.3–1.2)
Total Protein: 6.9 g/dL (ref 6.0–8.3)

## 2014-01-29 LAB — LIPID PANEL
Cholesterol: 177 mg/dL (ref 0–200)
HDL: 54.2 mg/dL (ref >39.00)
LDL Cholesterol: 103 mg/dL — ABNORMAL HIGH (ref 0–99)
TRIGLYCERIDES: 97 mg/dL (ref 0.0–149.0)
Total CHOL/HDL Ratio: 3
VLDL: 19.4 mg/dL (ref 0.0–40.0)

## 2014-01-29 LAB — BASIC METABOLIC PANEL
BUN: 20 mg/dL (ref 6–23)
CHLORIDE: 102 meq/L (ref 96–112)
CO2: 28 mEq/L (ref 19–32)
CREATININE: 0.9 mg/dL (ref 0.4–1.2)
Calcium: 9.5 mg/dL (ref 8.4–10.5)
GFR: 65.99 mL/min (ref >60.00)
Glucose, Bld: 127 mg/dL — ABNORMAL HIGH (ref 70–99)
Potassium: 3.7 mEq/L (ref 3.5–5.1)
Sodium: 139 mEq/L (ref 135–145)

## 2014-01-29 LAB — POCT INR: INR: 3.6

## 2014-01-29 NOTE — Patient Instructions (Signed)
Your physician has requested that you have an echocardiogram. Echocardiography is a painless test that uses sound waves to create images of your heart. It provides your doctor with information about the size and shape of your heart and how well your heart's chambers and valves are working. This procedure takes approximately one hour. There are no restrictions for this procedure.  Your physician recommends that you continue on your current medications as directed. Please refer to the Current Medication list given to you today.  Your physician wants you to follow-up in: 4 months with fasting labs (lp/bmet/hfp)  You will receive a reminder letter in the mail two months in advance. If you don't receive a letter, please call our office to schedule the follow-up appointment.

## 2014-01-29 NOTE — Assessment & Plan Note (Signed)
She is trying to be more careful with her diet.  Her weight is down 1 pound.  She is discouraged that she has not been able to lose more weight.  She is watching her carbohydrates..  She is exercising.

## 2014-01-29 NOTE — Addendum Note (Signed)
Addended by: Alvina Filbert B on: 01/29/2014 10:11 AM   Modules accepted: Orders

## 2014-01-29 NOTE — Assessment & Plan Note (Signed)
The patient is not having any dizzy spells or syncope 

## 2014-01-29 NOTE — Progress Notes (Signed)
Renee Ray Date of Birth:  1941/03/20 [address] [place], [place]  [address] [phone]  Fax   [phone]  HPI: This pleasant 73 year old woman is seen for a scheduled four-month followup office visit. She has a history of diabetes. Her diabetes is followed by Dr. Buddy Duty. She has a history of high blood pressure and a history of established chronic atrial fibrillation. She had a remote attempt at cardioversion which was unsuccessful. She has not been having any symptoms from her atrial fibrillation. He also has a history of asthma and is followed by pulmonary. She participates in the pulmonary rehabilitation program.  Since last visit she has been feeling well.  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  She also goes to a gym and worked with a Physiological scientist twice a week.  She still works full time out at [employer] on the second shift which is from 1:30 PM until 10 PM.  Since last visit she has noticed some increased exertional dyspnea.  Her last chest x-ray was 09/05/13 and showed borderline cardiomegaly and changes of emphysema.  Computer does not show results of her remote echocardiogram.   Current Outpatient Prescriptions  Medication Sig Dispense Refill  . albuterol (PROVENTIL HFA;VENTOLIN HFA) 108 (90 BASE) MCG/ACT inhaler Inhale 2 puffs into the lungs every 4 (four) hours as needed for wheezing or shortness of breath.      [name] aspirin 81 MG tablet Take 81 mg by mouth daily.       [name] b complex vitamins capsule Take 1 capsule by mouth daily.       . calcium carbonate (OS-CAL) 600 MG TABS Take 600 mg by mouth daily.       . cholecalciferol (VITAMIN D) 1000 UNITS tablet Take 1,000 Units by mouth daily.       . Fluticasone Furoate-Vilanterol (BREO ELLIPTA) 100-25 MCG/INH AEPB Inhale 1 puff into the lungs daily.  1 each  0  . furosemide (LASIX) 40 MG tablet TAKE 1 TABLET BY MOUTH ONCE DAILY  90 tablet  0  .  glucose blood test strip as directed.      . insulin glargine (LANTUS) 100 UNIT/ML injection Inject 40 Units into the skin at bedtime.       [name] KLOR-CON M20 20 MEQ tablet TAKE 1 TABLET BY MOUTH EVERY DAY  30 tablet  0  . losartan-hydrochlorothiazide (HYZAAR) 100-25 MG per tablet Take 1 tablet by mouth daily.      . metFORMIN (GLUCOPHAGE) 1000 MG tablet Take 1,000 mg by mouth daily with breakfast.      . Multiple Vitamin (MULTIVITAMIN) tablet Take 1 tablet by mouth daily.       . Omega-3 Fatty Acids (FISH OIL PO) Take 10 mLs by mouth daily.      . potassium chloride SA (K-DUR,KLOR-CON) 20 MEQ tablet Take 20 mEq by mouth daily.      [name] PROAIR HFA 108 (90 BASE) MCG/ACT inhaler INHALE 2 PUFFS EVERY 4 HOURS AS NEEDED.  8.5 each  5  . rosuvastatin (CRESTOR) 5 MG tablet Take 1 tablet (5 mg total) by mouth daily.  30 tablet  5  . sitaGLIPtin (JANUVIA) 50 MG tablet Take 50 mg by mouth daily.      [name] SPIRIVA HANDIHALER 18 MCG inhalation capsule INHALE 1 CAPSULE VIA HANDIHALER EVERY MORNING  30 capsule  4  . warfarin (COUMADIN) 5 MG tablet Take 1 tablet (5 mg total)  by mouth as directed.  130 tablet  1   No current facility-administered medications for this visit.    Allergies  Allergen Reactions  . Janumet [Sitagliptin-Metformin Hcl] Swelling  . Amoxicillin Diarrhea  . Sulfonamide Derivatives Other (See Comments)    REACTION: thrush    Patient Active Problem List   Diagnosis Date Noted  . Encounter for therapeutic drug monitoring 01/06/2014  . Upper respiratory infection 09/02/2013  . Heel spur 09/06/2012  . Acute medial meniscal tear 03/02/2012  . Hyperlipidemia 08/24/2011  . Benign hypertensive heart disease without heart failure 05/16/2011  . Asthma with COPD 09/03/2009  . DIABETES MELLITUS 09/04/2008  . CARDIOMYOPATHY 09/04/2008  . ATRIAL FIBRILLATION 09/04/2008  . ASTHMATIC BRONCHITIS, ACUTE 09/04/2008    History  Smoking status  . Former Smoker -- 20 years  . Types: Cigarettes  .  Quit date: 05/05/1996  Smokeless tobacco  . Never Used    History  Alcohol Use No    Family History  Problem Relation Age of Onset  . Arrhythmia Mother   . Heart failure Mother   . Stroke Mother   . Heart attack Father   . Diabetes Brother   . Heart disease Brother     Review of Systems: The patient denies any heat or cold intolerance.  No weight gain or weight loss.  The patient denies headaches or blurry vision.  There is no cough or sputum production.  The patient denies dizziness.  There is no hematuria or hematochezia.  The patient denies any muscle aches or arthritis.  The patient denies any rash.  The patient denies frequent falling or instability.  There is no history of depression or anxiety.  All other systems were reviewed and are negative.   Physical Exam: Filed Vitals:   01/29/14 0752  BP: 148/86  Pulse: 87   the general appearance reveals a well-developed well-nourished woman in no distress.  Weight is up 2 pounds since last visit.The head and neck exam reveals pupils equal and reactive.  Extraocular movements are full.  There is no scleral icterus.  The mouth and pharynx are normal.  The neck is supple.  The carotids reveal no bruits.  The jugular venous pressure is normal.  The  thyroid is not enlarged.  There is no lymphadenopathy.  The chest is clear to percussion and auscultation.  There are no rales or rhonchi.  Expansion of the chest is symmetrical.  The precordium is quiet.  The pulse is irregularly irregular The first heart sound is normal.  The second heart sound is physiologically split.  There is no murmur gallop rub or click.  There is no abnormal lift or heave.  The abdomen is soft and nontender.  The bowel sounds are normal.  The liver and spleen are not enlarged.  There are no abdominal masses.  There are no abdominal bruits.  Extremities reveal good pedal pulses.  There is no phlebitis or edema.  There is no cyanosis or clubbing.  Strength is normal and  symmetrical in all extremities.  There is no lateralizing weakness.  There are no sensory deficits.  The skin is warm and dry.  There is no rash.   EKG shows atrial fibrillation with controlled ventricular response is unchanged and 05/03/12   Assessment / Plan:  Continue on same medication.  Careful diet.  Blood work today is pending.  Recheck in 4 months for office visit lipid panel hepatic function panel and basal metabolic panel.  Arrange for 2-D echo  to evaluate her dyspnea.

## 2014-01-29 NOTE — Assessment & Plan Note (Signed)
The patient is in permanent atrial fibrillation.  She has been experiencing some increased dyspnea.  She has been using her inhaler twice a day and that seems to help.  We will update her echocardiogram to look at LV function and valve function.

## 2014-01-30 ENCOUNTER — Encounter (HOSPITAL_COMMUNITY)
Admission: RE | Admit: 2014-01-30 | Discharge: 2014-01-30 | Disposition: A | Discharge status: Home / Self Care | Payer: Self-pay | Source: Ambulatory Visit | Attending: Internal Medicine | Admitting: Internal Medicine

## 2014-02-04 ENCOUNTER — Encounter (HOSPITAL_COMMUNITY): Payer: Self-pay

## 2014-02-06 ENCOUNTER — Encounter (HOSPITAL_COMMUNITY)
Admission: RE | Admit: 2014-02-06 | Discharge: 2014-02-06 | Disposition: A | Discharge status: Home / Self Care | Payer: Self-pay | Source: Ambulatory Visit | Attending: Internal Medicine | Admitting: Internal Medicine

## 2014-02-06 DIAGNOSIS — I119 Hypertensive heart disease without heart failure: Secondary | ICD-10-CM | POA: Insufficient documentation

## 2014-02-06 DIAGNOSIS — Z5189 Encounter for other specified aftercare: Secondary | ICD-10-CM | POA: Insufficient documentation

## 2014-02-06 DIAGNOSIS — E119 Type 2 diabetes mellitus without complications: Secondary | ICD-10-CM | POA: Insufficient documentation

## 2014-02-06 DIAGNOSIS — J4489 Other specified chronic obstructive pulmonary disease: Secondary | ICD-10-CM | POA: Insufficient documentation

## 2014-02-06 DIAGNOSIS — J449 Chronic obstructive pulmonary disease, unspecified: Secondary | ICD-10-CM | POA: Insufficient documentation

## 2014-02-06 DIAGNOSIS — I428 Other cardiomyopathies: Secondary | ICD-10-CM | POA: Insufficient documentation

## 2014-02-06 DIAGNOSIS — E785 Hyperlipidemia, unspecified: Secondary | ICD-10-CM | POA: Insufficient documentation

## 2014-02-06 DIAGNOSIS — I4891 Unspecified atrial fibrillation: Secondary | ICD-10-CM | POA: Insufficient documentation

## 2014-02-10 ENCOUNTER — Other Ambulatory Visit: Payer: Self-pay | Admitting: Cardiology

## 2014-02-11 ENCOUNTER — Encounter (HOSPITAL_COMMUNITY)
Admission: RE | Admit: 2014-02-11 | Discharge: 2014-02-11 | Disposition: A | Discharge status: Home / Self Care | Payer: Self-pay | Source: Ambulatory Visit | Attending: Internal Medicine | Admitting: Internal Medicine

## 2014-02-11 ENCOUNTER — Telehealth: Payer: Self-pay | Admitting: Cardiology

## 2014-02-11 NOTE — Telephone Encounter (Signed)
New message  ° ° °patient calling for test results  °

## 2014-02-11 NOTE — Telephone Encounter (Signed)
Lipid, bmet, and hfp from 01/29/14 reviewed by  Dr. Mare Ferrari and they are stable continue same dose of medications watch sweets and carbs. Mailed to patient and left message on home and cell number

## 2014-02-12 ENCOUNTER — Ambulatory Visit (HOSPITAL_COMMUNITY): Payer: Self-pay

## 2014-02-13 ENCOUNTER — Encounter (HOSPITAL_COMMUNITY): Payer: Self-pay

## 2014-02-14 ENCOUNTER — Ambulatory Visit (INDEPENDENT_AMBULATORY_CARE_PROVIDER_SITE_OTHER): Payer: Medicare Other

## 2014-02-14 DIAGNOSIS — R6889 Other general symptoms and signs: Secondary | ICD-10-CM

## 2014-02-14 DIAGNOSIS — Z5181 Encounter for therapeutic drug level monitoring: Secondary | ICD-10-CM

## 2014-02-14 DIAGNOSIS — R899 Unspecified abnormal finding in specimens from other organs, systems and tissues: Secondary | ICD-10-CM

## 2014-02-14 DIAGNOSIS — I4891 Unspecified atrial fibrillation: Secondary | ICD-10-CM

## 2014-02-14 LAB — POCT INR: INR: 2.7

## 2014-02-18 ENCOUNTER — Encounter (HOSPITAL_COMMUNITY)
Admission: RE | Admit: 2014-02-18 | Discharge: 2014-02-18 | Disposition: A | Discharge status: Home / Self Care | Payer: Self-pay | Source: Ambulatory Visit | Attending: Internal Medicine | Admitting: Internal Medicine

## 2014-02-20 ENCOUNTER — Encounter (HOSPITAL_COMMUNITY): Payer: Self-pay

## 2014-02-25 ENCOUNTER — Encounter (HOSPITAL_COMMUNITY)
Admission: RE | Admit: 2014-02-25 | Discharge: 2014-02-25 | Disposition: A | Discharge status: Home / Self Care | Payer: Self-pay | Source: Ambulatory Visit | Attending: Internal Medicine | Admitting: Internal Medicine

## 2014-02-27 ENCOUNTER — Encounter (HOSPITAL_COMMUNITY)
Admission: RE | Admit: 2014-02-27 | Discharge: 2014-02-27 | Disposition: A | Discharge status: Home / Self Care | Payer: Self-pay | Source: Ambulatory Visit | Attending: Internal Medicine | Admitting: Internal Medicine

## 2014-02-28 ENCOUNTER — Telehealth: Payer: Self-pay | Admitting: *Deleted

## 2014-02-28 NOTE — Telephone Encounter (Signed)
Patient requests crestor samples. I will place at the front desk for pick up.

## 2014-03-04 ENCOUNTER — Encounter (HOSPITAL_COMMUNITY)
Admission: RE | Admit: 2014-03-04 | Discharge: 2014-03-04 | Disposition: A | Discharge status: Home / Self Care | Payer: Self-pay | Source: Ambulatory Visit | Attending: Internal Medicine | Admitting: Internal Medicine

## 2014-03-06 ENCOUNTER — Encounter (HOSPITAL_COMMUNITY)
Admission: RE | Admit: 2014-03-06 | Discharge: 2014-03-06 | Disposition: A | Discharge status: Home / Self Care | Payer: Self-pay | Source: Ambulatory Visit | Attending: Internal Medicine | Admitting: Internal Medicine

## 2014-03-07 ENCOUNTER — Ambulatory Visit (INDEPENDENT_AMBULATORY_CARE_PROVIDER_SITE_OTHER): Payer: Medicare Other | Admitting: Pharmacist

## 2014-03-07 DIAGNOSIS — I4891 Unspecified atrial fibrillation: Secondary | ICD-10-CM

## 2014-03-07 DIAGNOSIS — R6889 Other general symptoms and signs: Secondary | ICD-10-CM

## 2014-03-07 DIAGNOSIS — Z5181 Encounter for therapeutic drug level monitoring: Secondary | ICD-10-CM

## 2014-03-07 DIAGNOSIS — R899 Unspecified abnormal finding in specimens from other organs, systems and tissues: Secondary | ICD-10-CM

## 2014-03-07 LAB — POCT INR: INR: 2

## 2014-03-11 ENCOUNTER — Encounter (HOSPITAL_COMMUNITY): Payer: Medicare Other

## 2014-03-11 DIAGNOSIS — I4891 Unspecified atrial fibrillation: Secondary | ICD-10-CM | POA: Insufficient documentation

## 2014-03-11 DIAGNOSIS — Z5189 Encounter for other specified aftercare: Secondary | ICD-10-CM | POA: Insufficient documentation

## 2014-03-11 DIAGNOSIS — E785 Hyperlipidemia, unspecified: Secondary | ICD-10-CM | POA: Insufficient documentation

## 2014-03-11 DIAGNOSIS — I428 Other cardiomyopathies: Secondary | ICD-10-CM | POA: Insufficient documentation

## 2014-03-11 DIAGNOSIS — E119 Type 2 diabetes mellitus without complications: Secondary | ICD-10-CM | POA: Insufficient documentation

## 2014-03-11 DIAGNOSIS — J449 Chronic obstructive pulmonary disease, unspecified: Secondary | ICD-10-CM | POA: Insufficient documentation

## 2014-03-11 DIAGNOSIS — I119 Hypertensive heart disease without heart failure: Secondary | ICD-10-CM | POA: Insufficient documentation

## 2014-03-11 DIAGNOSIS — J4489 Other specified chronic obstructive pulmonary disease: Secondary | ICD-10-CM | POA: Insufficient documentation

## 2014-03-12 ENCOUNTER — Ambulatory Visit (HOSPITAL_COMMUNITY): Payer: Medicare Other | Attending: Cardiology | Admitting: Radiology

## 2014-03-12 DIAGNOSIS — E785 Hyperlipidemia, unspecified: Secondary | ICD-10-CM | POA: Insufficient documentation

## 2014-03-12 DIAGNOSIS — E119 Type 2 diabetes mellitus without complications: Secondary | ICD-10-CM | POA: Insufficient documentation

## 2014-03-12 DIAGNOSIS — I10 Essential (primary) hypertension: Secondary | ICD-10-CM | POA: Insufficient documentation

## 2014-03-12 DIAGNOSIS — R0989 Other specified symptoms and signs involving the circulatory and respiratory systems: Secondary | ICD-10-CM | POA: Insufficient documentation

## 2014-03-12 DIAGNOSIS — Z87891 Personal history of nicotine dependence: Secondary | ICD-10-CM | POA: Insufficient documentation

## 2014-03-12 DIAGNOSIS — I4891 Unspecified atrial fibrillation: Secondary | ICD-10-CM | POA: Insufficient documentation

## 2014-03-12 DIAGNOSIS — I059 Rheumatic mitral valve disease, unspecified: Secondary | ICD-10-CM | POA: Insufficient documentation

## 2014-03-12 DIAGNOSIS — R0609 Other forms of dyspnea: Secondary | ICD-10-CM | POA: Insufficient documentation

## 2014-03-12 NOTE — Progress Notes (Signed)
Echocardiogram performed.  

## 2014-03-13 ENCOUNTER — Encounter (HOSPITAL_COMMUNITY)
Admission: RE | Admit: 2014-03-13 | Discharge: 2014-03-13 | Disposition: A | Discharge status: Home / Self Care | Payer: Self-pay | Source: Ambulatory Visit | Attending: Internal Medicine | Admitting: Internal Medicine

## 2014-03-18 ENCOUNTER — Encounter (HOSPITAL_COMMUNITY)
Admission: RE | Admit: 2014-03-18 | Discharge: 2014-03-18 | Disposition: A | Discharge status: Home / Self Care | Payer: Self-pay | Source: Ambulatory Visit | Attending: Internal Medicine | Admitting: Internal Medicine

## 2014-03-20 ENCOUNTER — Encounter (HOSPITAL_COMMUNITY): Payer: Self-pay

## 2014-03-25 ENCOUNTER — Encounter (HOSPITAL_COMMUNITY): Payer: Self-pay

## 2014-03-27 ENCOUNTER — Encounter (HOSPITAL_COMMUNITY): Payer: Self-pay

## 2014-03-31 ENCOUNTER — Other Ambulatory Visit: Payer: Self-pay | Admitting: Cardiology

## 2014-04-01 ENCOUNTER — Encounter (HOSPITAL_COMMUNITY)
Admission: RE | Admit: 2014-04-01 | Discharge: 2014-04-01 | Disposition: A | Discharge status: Home / Self Care | Payer: Self-pay | Source: Ambulatory Visit | Attending: Internal Medicine | Admitting: Internal Medicine

## 2014-04-03 ENCOUNTER — Ambulatory Visit (INDEPENDENT_AMBULATORY_CARE_PROVIDER_SITE_OTHER): Payer: Medicare Other

## 2014-04-03 ENCOUNTER — Telehealth: Payer: Self-pay | Admitting: *Deleted

## 2014-04-03 ENCOUNTER — Encounter (HOSPITAL_COMMUNITY): Payer: Self-pay

## 2014-04-03 DIAGNOSIS — Z5181 Encounter for therapeutic drug level monitoring: Secondary | ICD-10-CM

## 2014-04-03 DIAGNOSIS — R899 Unspecified abnormal finding in specimens from other organs, systems and tissues: Secondary | ICD-10-CM

## 2014-04-03 DIAGNOSIS — I4891 Unspecified atrial fibrillation: Secondary | ICD-10-CM

## 2014-04-03 DIAGNOSIS — R6889 Other general symptoms and signs: Secondary | ICD-10-CM

## 2014-04-03 LAB — POCT INR: INR: 2.6

## 2014-04-03 NOTE — Telephone Encounter (Signed)
Patient requests crestor samples. Samples provided.

## 2014-04-08 ENCOUNTER — Encounter (HOSPITAL_COMMUNITY)
Admission: RE | Admit: 2014-04-08 | Discharge: 2014-04-08 | Disposition: A | Discharge status: Home / Self Care | Payer: Self-pay | Source: Ambulatory Visit | Attending: Internal Medicine | Admitting: Internal Medicine

## 2014-04-08 DIAGNOSIS — E119 Type 2 diabetes mellitus without complications: Secondary | ICD-10-CM | POA: Insufficient documentation

## 2014-04-08 DIAGNOSIS — I428 Other cardiomyopathies: Secondary | ICD-10-CM | POA: Insufficient documentation

## 2014-04-08 DIAGNOSIS — I4891 Unspecified atrial fibrillation: Secondary | ICD-10-CM | POA: Insufficient documentation

## 2014-04-08 DIAGNOSIS — I119 Hypertensive heart disease without heart failure: Secondary | ICD-10-CM | POA: Insufficient documentation

## 2014-04-08 DIAGNOSIS — E785 Hyperlipidemia, unspecified: Secondary | ICD-10-CM | POA: Insufficient documentation

## 2014-04-08 DIAGNOSIS — J449 Chronic obstructive pulmonary disease, unspecified: Secondary | ICD-10-CM | POA: Insufficient documentation

## 2014-04-08 DIAGNOSIS — J4489 Other specified chronic obstructive pulmonary disease: Secondary | ICD-10-CM | POA: Insufficient documentation

## 2014-04-08 DIAGNOSIS — Z5189 Encounter for other specified aftercare: Secondary | ICD-10-CM | POA: Insufficient documentation

## 2014-04-10 ENCOUNTER — Encounter (HOSPITAL_COMMUNITY)
Admission: RE | Admit: 2014-04-10 | Discharge: 2014-04-10 | Disposition: A | Discharge status: Home / Self Care | Payer: Self-pay | Source: Ambulatory Visit | Attending: Internal Medicine | Admitting: Internal Medicine

## 2014-04-15 ENCOUNTER — Encounter (HOSPITAL_COMMUNITY)
Admission: RE | Admit: 2014-04-15 | Discharge: 2014-04-15 | Disposition: A | Discharge status: Home / Self Care | Payer: Self-pay | Source: Ambulatory Visit | Attending: Internal Medicine | Admitting: Internal Medicine

## 2014-04-17 ENCOUNTER — Encounter (HOSPITAL_COMMUNITY): Payer: Self-pay

## 2014-04-22 ENCOUNTER — Encounter (HOSPITAL_COMMUNITY)
Admission: RE | Admit: 2014-04-22 | Discharge: 2014-04-22 | Disposition: A | Discharge status: Home / Self Care | Payer: Self-pay | Source: Ambulatory Visit | Attending: Internal Medicine | Admitting: Internal Medicine

## 2014-04-24 ENCOUNTER — Encounter (HOSPITAL_COMMUNITY): Payer: Self-pay

## 2014-04-29 ENCOUNTER — Encounter (HOSPITAL_COMMUNITY)
Admission: RE | Admit: 2014-04-29 | Discharge: 2014-04-29 | Disposition: A | Discharge status: Home / Self Care | Payer: Self-pay | Source: Ambulatory Visit | Attending: Internal Medicine | Admitting: Internal Medicine

## 2014-04-30 ENCOUNTER — Telehealth: Payer: Self-pay

## 2014-04-30 NOTE — Telephone Encounter (Signed)
Patient called for samples of crestor 5 mg placed samples at front desk

## 2014-05-01 ENCOUNTER — Encounter (HOSPITAL_COMMUNITY): Payer: Self-pay

## 2014-05-06 ENCOUNTER — Encounter (HOSPITAL_COMMUNITY)
Admission: RE | Admit: 2014-05-06 | Discharge: 2014-05-06 | Disposition: A | Discharge status: Home / Self Care | Payer: Self-pay | Source: Ambulatory Visit | Attending: Internal Medicine | Admitting: Internal Medicine

## 2014-05-08 ENCOUNTER — Encounter (HOSPITAL_COMMUNITY)
Admission: RE | Admit: 2014-05-08 | Discharge: 2014-05-08 | Disposition: A | Discharge status: Home / Self Care | Payer: Self-pay | Source: Ambulatory Visit | Attending: Internal Medicine | Admitting: Internal Medicine

## 2014-05-08 DIAGNOSIS — I119 Hypertensive heart disease without heart failure: Secondary | ICD-10-CM | POA: Insufficient documentation

## 2014-05-08 DIAGNOSIS — E119 Type 2 diabetes mellitus without complications: Secondary | ICD-10-CM | POA: Insufficient documentation

## 2014-05-08 DIAGNOSIS — J449 Chronic obstructive pulmonary disease, unspecified: Secondary | ICD-10-CM | POA: Insufficient documentation

## 2014-05-08 DIAGNOSIS — J4489 Other specified chronic obstructive pulmonary disease: Secondary | ICD-10-CM | POA: Insufficient documentation

## 2014-05-08 DIAGNOSIS — Z5189 Encounter for other specified aftercare: Secondary | ICD-10-CM | POA: Insufficient documentation

## 2014-05-08 DIAGNOSIS — I428 Other cardiomyopathies: Secondary | ICD-10-CM | POA: Insufficient documentation

## 2014-05-08 DIAGNOSIS — E785 Hyperlipidemia, unspecified: Secondary | ICD-10-CM | POA: Insufficient documentation

## 2014-05-08 DIAGNOSIS — I4891 Unspecified atrial fibrillation: Secondary | ICD-10-CM | POA: Insufficient documentation

## 2014-05-13 ENCOUNTER — Encounter (HOSPITAL_COMMUNITY)
Admission: RE | Admit: 2014-05-13 | Discharge: 2014-05-13 | Disposition: A | Discharge status: Home / Self Care | Payer: Self-pay | Source: Ambulatory Visit | Attending: Internal Medicine | Admitting: Internal Medicine

## 2014-05-15 ENCOUNTER — Ambulatory Visit (INDEPENDENT_AMBULATORY_CARE_PROVIDER_SITE_OTHER): Payer: Medicare Other

## 2014-05-15 ENCOUNTER — Encounter (HOSPITAL_COMMUNITY)
Admission: RE | Admit: 2014-05-15 | Discharge: 2014-05-15 | Disposition: A | Discharge status: Home / Self Care | Payer: Self-pay | Source: Ambulatory Visit | Attending: Internal Medicine | Admitting: Internal Medicine

## 2014-05-15 DIAGNOSIS — Z5181 Encounter for therapeutic drug level monitoring: Secondary | ICD-10-CM

## 2014-05-15 DIAGNOSIS — I4891 Unspecified atrial fibrillation: Secondary | ICD-10-CM

## 2014-05-15 DIAGNOSIS — R899 Unspecified abnormal finding in specimens from other organs, systems and tissues: Secondary | ICD-10-CM

## 2014-05-15 DIAGNOSIS — R6889 Other general symptoms and signs: Secondary | ICD-10-CM

## 2014-05-15 LAB — POCT INR: INR: 1.7

## 2014-05-20 ENCOUNTER — Encounter (HOSPITAL_COMMUNITY): Payer: Self-pay

## 2014-05-22 ENCOUNTER — Encounter (HOSPITAL_COMMUNITY)
Admission: RE | Admit: 2014-05-22 | Discharge: 2014-05-22 | Disposition: A | Discharge status: Home / Self Care | Payer: Self-pay | Source: Ambulatory Visit | Attending: Internal Medicine | Admitting: Internal Medicine

## 2014-05-27 ENCOUNTER — Encounter (HOSPITAL_COMMUNITY): Payer: Self-pay

## 2014-05-29 ENCOUNTER — Encounter (HOSPITAL_COMMUNITY)
Admission: RE | Admit: 2014-05-29 | Discharge: 2014-05-29 | Disposition: A | Discharge status: Home / Self Care | Payer: Self-pay | Source: Ambulatory Visit | Attending: Internal Medicine | Admitting: Internal Medicine

## 2014-06-03 ENCOUNTER — Encounter (HOSPITAL_COMMUNITY): Payer: Self-pay

## 2014-06-05 ENCOUNTER — Encounter (HOSPITAL_COMMUNITY): Payer: Self-pay

## 2014-06-07 ENCOUNTER — Other Ambulatory Visit: Payer: Self-pay | Admitting: Cardiology

## 2014-06-10 ENCOUNTER — Ambulatory Visit (INDEPENDENT_AMBULATORY_CARE_PROVIDER_SITE_OTHER): Payer: Medicare Other | Admitting: Cardiology

## 2014-06-10 ENCOUNTER — Encounter (HOSPITAL_COMMUNITY)
Admission: RE | Admit: 2014-06-10 | Discharge: 2014-06-10 | Disposition: A | Discharge status: Home / Self Care | Payer: Self-pay | Source: Ambulatory Visit | Attending: Internal Medicine | Admitting: Internal Medicine

## 2014-06-10 ENCOUNTER — Ambulatory Visit (INDEPENDENT_AMBULATORY_CARE_PROVIDER_SITE_OTHER): Payer: Medicare Other

## 2014-06-10 ENCOUNTER — Encounter: Payer: Self-pay | Admitting: Cardiology

## 2014-06-10 VITALS — BP 140/70 | HR 83 | Ht 62.0 in | Wt 196.0 lb

## 2014-06-10 DIAGNOSIS — I428 Other cardiomyopathies: Secondary | ICD-10-CM | POA: Insufficient documentation

## 2014-06-10 DIAGNOSIS — I4891 Unspecified atrial fibrillation: Secondary | ICD-10-CM

## 2014-06-10 DIAGNOSIS — R0609 Other forms of dyspnea: Secondary | ICD-10-CM

## 2014-06-10 DIAGNOSIS — Z5189 Encounter for other specified aftercare: Secondary | ICD-10-CM | POA: Insufficient documentation

## 2014-06-10 DIAGNOSIS — J449 Chronic obstructive pulmonary disease, unspecified: Secondary | ICD-10-CM | POA: Insufficient documentation

## 2014-06-10 DIAGNOSIS — E119 Type 2 diabetes mellitus without complications: Secondary | ICD-10-CM | POA: Insufficient documentation

## 2014-06-10 DIAGNOSIS — R6889 Other general symptoms and signs: Secondary | ICD-10-CM

## 2014-06-10 DIAGNOSIS — I119 Hypertensive heart disease without heart failure: Secondary | ICD-10-CM | POA: Insufficient documentation

## 2014-06-10 DIAGNOSIS — Z5181 Encounter for therapeutic drug level monitoring: Secondary | ICD-10-CM

## 2014-06-10 DIAGNOSIS — R0989 Other specified symptoms and signs involving the circulatory and respiratory systems: Secondary | ICD-10-CM

## 2014-06-10 DIAGNOSIS — I482 Chronic atrial fibrillation, unspecified: Secondary | ICD-10-CM

## 2014-06-10 DIAGNOSIS — E785 Hyperlipidemia, unspecified: Secondary | ICD-10-CM | POA: Insufficient documentation

## 2014-06-10 DIAGNOSIS — R899 Unspecified abnormal finding in specimens from other organs, systems and tissues: Secondary | ICD-10-CM

## 2014-06-10 DIAGNOSIS — J4489 Other specified chronic obstructive pulmonary disease: Secondary | ICD-10-CM | POA: Insufficient documentation

## 2014-06-10 LAB — POCT INR: INR: 5.9

## 2014-06-10 NOTE — Assessment & Plan Note (Signed)
The patient has diabetes followed closely by Dr. Buddy Duty.  She has occasional mild hypoglycemic episodes during the night.

## 2014-06-10 NOTE — Assessment & Plan Note (Signed)
Blood pressure has been stable on current therapy.  The patient has been on a careful diet and has lost 7 pounds since last visit.  She has cut down on carbs.  She recently returned from a beach trip with some of her girlfriends and amazingly none of them gained any weight on the trip.

## 2014-06-10 NOTE — Patient Instructions (Signed)
Will obtain labs today and call you with the results (BMET/HFP/LP)  Your physician recommends that you continue on your current medications as directed. Please refer to the Current Medication list given to you today.  Your physician recommends that you schedule a follow-up appointment in: 4 months with fasting labs (lp/bmet/hfp)

## 2014-06-10 NOTE — Assessment & Plan Note (Signed)
Her INR is generally good but today her INR was greater than 5.  This may have reflected a change in her diet while on her beach trip.  The patient has not had any TIA or stroke symptoms

## 2014-06-10 NOTE — Progress Notes (Signed)
Renee Ray Date of Birth:  1941/03/26 Renee Ray [place], [place]  [address] [phone]  Fax   [phone]  HPI: This pleasant 73 year old woman is seen for a scheduled four-month followup office visit. She has a history of diabetes. Her diabetes is followed by Dr. Buddy Duty. She has a history of high blood pressure and a history of established chronic atrial fibrillation. She had a remote attempt at cardioversion which was unsuccessful. She has not been having any symptoms from her atrial fibrillation. He also has a history of asthma and is followed by pulmonary. She participates in the pulmonary rehabilitation program. Since last visit she has been feeling well. 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  She had an echocardiogram in 03/12/14 which showed normal ejection fraction of 60-65% and there was  diastolic dysfunction.  Current Outpatient Prescriptions  Medication Sig Dispense Refill  . albuterol (PROVENTIL HFA;VENTOLIN HFA) 108 (90 BASE) MCG/ACT inhaler Inhale 2 puffs into the lungs every 4 (four) hours as needed for wheezing or shortness of breath.      [name] aspirin 81 MG tablet Take 81 mg by mouth daily.       [name] b complex vitamins capsule Take 1 capsule by mouth daily.       . calcium carbonate (OS-CAL) 600 MG TABS Take 600 mg by mouth daily.       . cholecalciferol (VITAMIN D) 1000 UNITS tablet Take 1,000 Units by mouth daily.       . furosemide (LASIX) 40 MG tablet TAKE 1 TABLET BY MOUTH ONCE DAILY  90 tablet  0  . glucose blood test strip as directed.      . insulin glargine (LANTUS) 100 UNIT/ML injection Inject 40 Units into the skin at bedtime.       [name] KLOR-CON M20 20 MEQ tablet TAKE 1 TABLET BY MOUTH ONCE DAILY  30 tablet  6  . losartan-hydrochlorothiazide (HYZAAR) 100-25 MG per tablet TAKE 1 TABLET BY MOUTH DAILY.  90 tablet  1  . metFORMIN (GLUCOPHAGE) 1000 MG tablet Take 1,000 mg by  mouth 2 (two) times daily with a meal.       . Multiple Vitamin (MULTIVITAMIN) tablet Take 1 tablet by mouth daily.       . Omega-3 Fatty Acids (FISH OIL PO) Take 10 mLs by mouth daily.      [name] PROAIR HFA 108 (90 BASE) MCG/ACT inhaler INHALE 2 PUFFS EVERY 4 HOURS AS NEEDED.  8.5 each  5  . rosuvastatin (CRESTOR) 5 MG tablet Take 1 tablet (5 mg total) by mouth daily.  30 tablet  5  . sitaGLIPtin (JANUVIA) 50 MG tablet Take 50 mg by mouth daily.      [name] SPIRIVA HANDIHALER 18 MCG inhalation capsule INHALE 1 CAPSULE VIA HANDIHALER EVERY MORNING  30 capsule  4  . warfarin (COUMADIN) 5 MG tablet 1 tablet daily except 2 tablets on Mondays. Or as directed  120 tablet  1  . Fluticasone Furoate-Vilanterol (BREO ELLIPTA) 100-25 MCG/INH AEPB Inhale 1 puff into the lungs daily.  1 each  0   No current facility-administered medications for this visit.    Allergies  Allergen Reactions  . Janumet [Sitagliptin-Metformin Hcl] Swelling  . Amoxicillin Diarrhea  . Sulfonamide Derivatives Other (See Comments)    REACTION: thrush    Patient Active Problem List   Diagnosis Date Noted  . Encounter for therapeutic drug monitoring  01/06/2014  . Upper respiratory infection 09/02/2013  . Heel spur 09/06/2012  . Acute medial meniscal tear 03/02/2012  . Hyperlipidemia 08/24/2011  . Benign hypertensive heart disease without heart failure 05/16/2011  . Asthma with COPD 09/03/2009  . DIABETES MELLITUS 09/04/2008  . CARDIOMYOPATHY 09/04/2008  . ATRIAL FIBRILLATION 09/04/2008  . ASTHMATIC BRONCHITIS, ACUTE 09/04/2008    History  Smoking status  . Former Smoker -- 20 years  . Types: Cigarettes  . Quit date: 05/05/1996  Smokeless tobacco  . Never Used    History  Alcohol Use No    Family History  Problem Relation Age of Onset  . Arrhythmia Mother   . Heart failure Mother   . Stroke Mother   . Heart attack Father   . Diabetes Brother   . Heart disease Brother     Review of Systems: The patient  denies any heat or cold intolerance.  No weight gain or weight loss.  The patient denies headaches or blurry vision.  There is no cough or sputum production.  The patient denies dizziness.  There is no hematuria or hematochezia.  The patient denies any muscle aches or arthritis.  The patient denies any rash.  The patient denies frequent falling or instability.  There is no history of depression or anxiety.  All other systems were reviewed and are negative.   Physical Exam: Filed Vitals:   06/10/14 1628  BP: 140/70  Pulse: 83   the general appearance is that of a well-developed well-nourished mildly overweight woman in no distress.The head and neck exam reveals pupils equal and reactive.  Extraocular movements are full.  There is no scleral icterus.  The mouth and pharynx are normal.  The neck is supple.  The carotids reveal no bruits.  The jugular venous pressure is normal.  The  thyroid is not enlarged.  There is no lymphadenopathy.  The chest is clear to percussion and auscultation.  There are no rales or rhonchi.  Expansion of the chest is symmetrical.  The precordium is quiet.  The pulse is irregularly irregular.  The first heart sound is normal.  The second heart sound is physiologically split.  There is no murmur gallop rub or click.  There is no abnormal lift or heave.  The abdomen is soft and nontender.  The bowel sounds are normal.  The liver and spleen are not enlarged.  There are no abdominal masses.  There are no abdominal bruits.  Extremities reveal good pedal pulses.  There is no phlebitis or edema.  There is no cyanosis or clubbing.  Strength is normal and symmetrical in all extremities.  There is no lateralizing weakness.  There are no sensory deficits.  The skin is warm and dry.  There is no rash.      Assessment / Plan: 1. permanent atrial fibrillation 2. Hypercholesterolemia 3. essential hypertension without heart failure 4. diabetes mellitus followed by Dr. Buddy Duty 5. asthma  followed by Dr. Annamaria Boots  Disposition: Her checking fasting lab work today.  Recheck in 4 months for office visit and fasting lipid panel hepatic function panel and basal metabolic panel. Continue on low carbohydrate weight loss diet

## 2014-06-11 LAB — HEPATIC FUNCTION PANEL
ALK PHOS: 68 U/L (ref 39–117)
ALT: 24 U/L (ref 0–35)
AST: 27 U/L (ref 0–37)
Albumin: 3.4 g/dL — ABNORMAL LOW (ref 3.5–5.2)
Bilirubin, Direct: 0 mg/dL (ref 0.0–0.3)
Total Bilirubin: 0.2 mg/dL (ref 0.2–1.2)
Total Protein: 6.8 g/dL (ref 6.0–8.3)

## 2014-06-11 LAB — BASIC METABOLIC PANEL
BUN: 28 mg/dL — ABNORMAL HIGH (ref 6–23)
CO2: 29 mEq/L (ref 19–32)
Calcium: 9.1 mg/dL (ref 8.4–10.5)
Chloride: 102 mEq/L (ref 96–112)
Creatinine, Ser: 0.8 mg/dL (ref 0.4–1.2)
GFR: 71.45 mL/min (ref >60.00)
Glucose, Bld: 146 mg/dL — ABNORMAL HIGH (ref 70–99)
Potassium: 3.4 mEq/L — ABNORMAL LOW (ref 3.5–5.1)
SODIUM: 138 meq/L (ref 135–145)

## 2014-06-11 LAB — LIPID PANEL
Cholesterol: 137 mg/dL (ref 0–200)
HDL: 47.6 mg/dL (ref >39.00)
LDL Cholesterol: 62 mg/dL (ref 0–99)
NONHDL: 89.4
Total CHOL/HDL Ratio: 3
Triglycerides: 137 mg/dL (ref 0.0–149.0)
VLDL: 27.4 mg/dL (ref 0.0–40.0)

## 2014-06-11 NOTE — Progress Notes (Signed)
Quick Note:  Please report to patient. The recent labs are stable. Continue same medication and careful diet. Potassium borderline low. Eat high K foods. ______

## 2014-06-12 ENCOUNTER — Encounter (HOSPITAL_COMMUNITY)
Admission: RE | Admit: 2014-06-12 | Discharge: 2014-06-12 | Disposition: A | Discharge status: Home / Self Care | Payer: Self-pay | Source: Ambulatory Visit | Attending: Internal Medicine | Admitting: Internal Medicine

## 2014-06-17 ENCOUNTER — Ambulatory Visit (INDEPENDENT_AMBULATORY_CARE_PROVIDER_SITE_OTHER): Payer: Medicare Other | Admitting: Pharmacist Clinician (PhC)/ Clinical Pharmacy Specialist

## 2014-06-17 ENCOUNTER — Encounter (HOSPITAL_COMMUNITY)
Admission: RE | Admit: 2014-06-17 | Discharge: 2014-06-17 | Disposition: A | Discharge status: Home / Self Care | Payer: Self-pay | Source: Ambulatory Visit | Attending: Internal Medicine | Admitting: Internal Medicine

## 2014-06-17 DIAGNOSIS — Z5181 Encounter for therapeutic drug level monitoring: Secondary | ICD-10-CM

## 2014-06-17 DIAGNOSIS — I482 Chronic atrial fibrillation, unspecified: Secondary | ICD-10-CM

## 2014-06-17 DIAGNOSIS — R6889 Other general symptoms and signs: Secondary | ICD-10-CM

## 2014-06-17 DIAGNOSIS — I4891 Unspecified atrial fibrillation: Secondary | ICD-10-CM

## 2014-06-17 DIAGNOSIS — R899 Unspecified abnormal finding in specimens from other organs, systems and tissues: Secondary | ICD-10-CM

## 2014-06-17 LAB — POCT INR: INR: 2.1

## 2014-06-19 ENCOUNTER — Encounter (HOSPITAL_COMMUNITY): Admission: RE | Admit: 2014-06-19 | Payer: Self-pay | Source: Ambulatory Visit

## 2014-06-24 ENCOUNTER — Encounter (HOSPITAL_COMMUNITY)
Admission: RE | Admit: 2014-06-24 | Discharge: 2014-06-24 | Disposition: A | Discharge status: Home / Self Care | Payer: Self-pay | Source: Ambulatory Visit | Attending: Internal Medicine | Admitting: Internal Medicine

## 2014-06-26 ENCOUNTER — Encounter (HOSPITAL_COMMUNITY)
Admission: RE | Admit: 2014-06-26 | Discharge: 2014-06-26 | Disposition: A | Discharge status: Home / Self Care | Payer: Self-pay | Source: Ambulatory Visit | Attending: Internal Medicine | Admitting: Internal Medicine

## 2014-07-01 ENCOUNTER — Encounter (HOSPITAL_COMMUNITY)
Admission: RE | Admit: 2014-07-01 | Discharge: 2014-07-01 | Disposition: A | Discharge status: Home / Self Care | Payer: Self-pay | Source: Ambulatory Visit | Attending: Internal Medicine | Admitting: Internal Medicine

## 2014-07-01 ENCOUNTER — Ambulatory Visit (INDEPENDENT_AMBULATORY_CARE_PROVIDER_SITE_OTHER): Payer: Medicare Other | Admitting: Pharmacist

## 2014-07-01 DIAGNOSIS — R899 Unspecified abnormal finding in specimens from other organs, systems and tissues: Secondary | ICD-10-CM

## 2014-07-01 DIAGNOSIS — R6889 Other general symptoms and signs: Secondary | ICD-10-CM

## 2014-07-01 DIAGNOSIS — Z5181 Encounter for therapeutic drug level monitoring: Secondary | ICD-10-CM

## 2014-07-01 DIAGNOSIS — I4891 Unspecified atrial fibrillation: Secondary | ICD-10-CM

## 2014-07-01 DIAGNOSIS — I482 Chronic atrial fibrillation, unspecified: Secondary | ICD-10-CM

## 2014-07-01 LAB — POCT INR: INR: 3.8

## 2014-07-03 ENCOUNTER — Encounter (HOSPITAL_COMMUNITY)
Admission: RE | Admit: 2014-07-03 | Discharge: 2014-07-03 | Disposition: A | Discharge status: Home / Self Care | Payer: Self-pay | Source: Ambulatory Visit | Attending: Internal Medicine | Admitting: Internal Medicine

## 2014-07-08 ENCOUNTER — Encounter (HOSPITAL_COMMUNITY): Payer: Medicare Other

## 2014-07-08 DIAGNOSIS — I428 Other cardiomyopathies: Secondary | ICD-10-CM | POA: Insufficient documentation

## 2014-07-08 DIAGNOSIS — I119 Hypertensive heart disease without heart failure: Secondary | ICD-10-CM | POA: Insufficient documentation

## 2014-07-08 DIAGNOSIS — Z5189 Encounter for other specified aftercare: Secondary | ICD-10-CM | POA: Insufficient documentation

## 2014-07-08 DIAGNOSIS — I4891 Unspecified atrial fibrillation: Secondary | ICD-10-CM | POA: Insufficient documentation

## 2014-07-08 DIAGNOSIS — J449 Chronic obstructive pulmonary disease, unspecified: Secondary | ICD-10-CM | POA: Insufficient documentation

## 2014-07-08 DIAGNOSIS — J4489 Other specified chronic obstructive pulmonary disease: Secondary | ICD-10-CM | POA: Insufficient documentation

## 2014-07-08 DIAGNOSIS — E119 Type 2 diabetes mellitus without complications: Secondary | ICD-10-CM | POA: Insufficient documentation

## 2014-07-08 DIAGNOSIS — E785 Hyperlipidemia, unspecified: Secondary | ICD-10-CM | POA: Insufficient documentation

## 2014-07-10 ENCOUNTER — Encounter (HOSPITAL_COMMUNITY): Payer: Self-pay

## 2014-07-15 ENCOUNTER — Telehealth: Payer: Self-pay

## 2014-07-15 ENCOUNTER — Ambulatory Visit (INDEPENDENT_AMBULATORY_CARE_PROVIDER_SITE_OTHER): Payer: Medicare Other | Admitting: Pharmacist

## 2014-07-15 ENCOUNTER — Encounter (HOSPITAL_COMMUNITY)
Admission: RE | Admit: 2014-07-15 | Discharge: 2014-07-15 | Disposition: A | Discharge status: Home / Self Care | Payer: Self-pay | Source: Ambulatory Visit | Attending: Internal Medicine | Admitting: Internal Medicine

## 2014-07-15 DIAGNOSIS — I4891 Unspecified atrial fibrillation: Secondary | ICD-10-CM

## 2014-07-15 DIAGNOSIS — R899 Unspecified abnormal finding in specimens from other organs, systems and tissues: Secondary | ICD-10-CM

## 2014-07-15 DIAGNOSIS — Z5181 Encounter for therapeutic drug level monitoring: Secondary | ICD-10-CM

## 2014-07-15 DIAGNOSIS — R6889 Other general symptoms and signs: Secondary | ICD-10-CM

## 2014-07-15 LAB — POCT INR: INR: 4.6

## 2014-07-15 NOTE — Telephone Encounter (Signed)
Patient was at the office and wanted samples of crestor  5mg  gave to [facility]

## 2014-07-16 ENCOUNTER — Other Ambulatory Visit: Payer: Self-pay | Admitting: Cardiology

## 2014-07-17 ENCOUNTER — Encounter (HOSPITAL_COMMUNITY): Payer: Self-pay

## 2014-07-22 ENCOUNTER — Encounter (HOSPITAL_COMMUNITY)
Admission: RE | Admit: 2014-07-22 | Discharge: 2014-07-22 | Disposition: A | Discharge status: Home / Self Care | Payer: Self-pay | Source: Ambulatory Visit | Attending: Internal Medicine | Admitting: Internal Medicine

## 2014-07-24 ENCOUNTER — Encounter (HOSPITAL_COMMUNITY)
Admission: RE | Admit: 2014-07-24 | Discharge: 2014-07-24 | Disposition: A | Discharge status: Home / Self Care | Payer: Self-pay | Source: Ambulatory Visit | Attending: Internal Medicine | Admitting: Internal Medicine

## 2014-07-29 ENCOUNTER — Encounter (HOSPITAL_COMMUNITY): Payer: Self-pay

## 2014-07-29 ENCOUNTER — Ambulatory Visit (INDEPENDENT_AMBULATORY_CARE_PROVIDER_SITE_OTHER): Payer: Medicare Other

## 2014-07-29 DIAGNOSIS — Z5181 Encounter for therapeutic drug level monitoring: Secondary | ICD-10-CM

## 2014-07-29 DIAGNOSIS — I4891 Unspecified atrial fibrillation: Secondary | ICD-10-CM

## 2014-07-29 DIAGNOSIS — R899 Unspecified abnormal finding in specimens from other organs, systems and tissues: Secondary | ICD-10-CM

## 2014-07-29 DIAGNOSIS — R6889 Other general symptoms and signs: Secondary | ICD-10-CM

## 2014-07-29 LAB — POCT INR: INR: 4.1

## 2014-07-31 ENCOUNTER — Encounter (HOSPITAL_COMMUNITY)
Admission: RE | Admit: 2014-07-31 | Discharge: 2014-07-31 | Disposition: A | Discharge status: Home / Self Care | Payer: Self-pay | Source: Ambulatory Visit | Attending: Internal Medicine | Admitting: Internal Medicine

## 2014-08-05 ENCOUNTER — Encounter (HOSPITAL_COMMUNITY): Payer: Self-pay

## 2014-08-07 ENCOUNTER — Encounter (HOSPITAL_COMMUNITY)
Admission: RE | Admit: 2014-08-07 | Discharge: 2014-08-07 | Disposition: A | Discharge status: Home / Self Care | Payer: Self-pay | Source: Ambulatory Visit | Attending: Internal Medicine | Admitting: Internal Medicine

## 2014-08-07 DIAGNOSIS — Z881 Allergy status to other antibiotic agents status: Secondary | ICD-10-CM | POA: Insufficient documentation

## 2014-08-07 DIAGNOSIS — J449 Chronic obstructive pulmonary disease, unspecified: Secondary | ICD-10-CM | POA: Insufficient documentation

## 2014-08-07 DIAGNOSIS — R42 Dizziness and giddiness: Secondary | ICD-10-CM | POA: Insufficient documentation

## 2014-08-07 DIAGNOSIS — I4891 Unspecified atrial fibrillation: Secondary | ICD-10-CM | POA: Insufficient documentation

## 2014-08-07 DIAGNOSIS — Z882 Allergy status to sulfonamides status: Secondary | ICD-10-CM | POA: Insufficient documentation

## 2014-08-07 DIAGNOSIS — N289 Disorder of kidney and ureter, unspecified: Secondary | ICD-10-CM | POA: Insufficient documentation

## 2014-08-07 DIAGNOSIS — J45909 Unspecified asthma, uncomplicated: Secondary | ICD-10-CM | POA: Insufficient documentation

## 2014-08-07 DIAGNOSIS — I10 Essential (primary) hypertension: Secondary | ICD-10-CM | POA: Insufficient documentation

## 2014-08-07 DIAGNOSIS — E119 Type 2 diabetes mellitus without complications: Secondary | ICD-10-CM | POA: Insufficient documentation

## 2014-08-07 DIAGNOSIS — M1990 Unspecified osteoarthritis, unspecified site: Secondary | ICD-10-CM | POA: Insufficient documentation

## 2014-08-07 DIAGNOSIS — Z87891 Personal history of nicotine dependence: Secondary | ICD-10-CM | POA: Insufficient documentation

## 2014-08-07 DIAGNOSIS — E785 Hyperlipidemia, unspecified: Secondary | ICD-10-CM | POA: Insufficient documentation

## 2014-08-07 DIAGNOSIS — D649 Anemia, unspecified: Secondary | ICD-10-CM | POA: Insufficient documentation

## 2014-08-12 ENCOUNTER — Encounter (HOSPITAL_COMMUNITY)
Admission: RE | Admit: 2014-08-12 | Discharge: 2014-08-12 | Disposition: A | Discharge status: Home / Self Care | Payer: Self-pay | Source: Ambulatory Visit | Attending: Internal Medicine | Admitting: Internal Medicine

## 2014-08-12 ENCOUNTER — Ambulatory Visit (INDEPENDENT_AMBULATORY_CARE_PROVIDER_SITE_OTHER): Payer: Medicare Other | Admitting: *Deleted

## 2014-08-12 DIAGNOSIS — I4891 Unspecified atrial fibrillation: Secondary | ICD-10-CM

## 2014-08-12 DIAGNOSIS — Z23 Encounter for immunization: Secondary | ICD-10-CM

## 2014-08-12 DIAGNOSIS — Z5181 Encounter for therapeutic drug level monitoring: Secondary | ICD-10-CM

## 2014-08-12 DIAGNOSIS — R899 Unspecified abnormal finding in specimens from other organs, systems and tissues: Secondary | ICD-10-CM

## 2014-08-12 LAB — POCT INR: INR: 3.4

## 2014-08-13 ENCOUNTER — Ambulatory Visit (HOSPITAL_COMMUNITY)
Admission: RE | Admit: 2014-08-13 | Discharge: 2014-08-13 | Disposition: A | Discharge status: Home / Self Care | Payer: Medicare Other | Source: Ambulatory Visit | Attending: Family Medicine | Admitting: Family Medicine

## 2014-08-13 ENCOUNTER — Ambulatory Visit (INDEPENDENT_AMBULATORY_CARE_PROVIDER_SITE_OTHER): Payer: Medicare Other | Admitting: Family Medicine

## 2014-08-13 ENCOUNTER — Telehealth: Payer: Self-pay | Admitting: *Deleted

## 2014-08-13 VITALS — BP 132/70 | HR 92 | Temp 98.4°F | Resp 18 | Ht 62.5 in | Wt 197.0 lb

## 2014-08-13 DIAGNOSIS — Z823 Family history of stroke: Secondary | ICD-10-CM

## 2014-08-13 DIAGNOSIS — D699 Hemorrhagic condition, unspecified: Secondary | ICD-10-CM

## 2014-08-13 DIAGNOSIS — R42 Dizziness and giddiness: Secondary | ICD-10-CM | POA: Diagnosis not present

## 2014-08-13 DIAGNOSIS — D649 Anemia, unspecified: Secondary | ICD-10-CM

## 2014-08-13 DIAGNOSIS — Z7901 Long term (current) use of anticoagulants: Secondary | ICD-10-CM | POA: Insufficient documentation

## 2014-08-13 DIAGNOSIS — E119 Type 2 diabetes mellitus without complications: Secondary | ICD-10-CM

## 2014-08-13 DIAGNOSIS — I4891 Unspecified atrial fibrillation: Secondary | ICD-10-CM

## 2014-08-13 LAB — POCT CBC
Granulocyte percent: 66.8 %G (ref 37–80)
HCT, POC: 36.9 % — AB (ref 37.7–47.9)
Hemoglobin: 11.1 g/dL — AB (ref 12.2–16.2)
Lymph, poc: 1.9 (ref 0.6–3.4)
MCH: 21.6 pg — AB (ref 27–31.2)
MCHC: 30.1 g/dL — AB (ref 31.8–35.4)
MCV: 71.8 fL — AB (ref 80–97)
MID (cbc): 0.8 (ref 0–0.9)
MPV: 7 fL (ref 0–99.8)
POC GRANULOCYTE: 5.4 (ref 2–6.9)
POC LYMPH %: 23.6 % (ref 10–50)
POC MID %: 0.6 % (ref 0–12)
Platelet Count, POC: 373 10*3/uL (ref 142–424)
RBC: 5.13 M/uL (ref 4.04–5.48)
RDW, POC: 19.8 %
WBC: 8.1 10*3/uL (ref 4.6–10.2)

## 2014-08-13 LAB — GLUCOSE, POCT (MANUAL RESULT ENTRY): POC GLUCOSE: 100 mg/dL — AB (ref 70–99)

## 2014-08-13 NOTE — Telephone Encounter (Signed)
Patient phoned in c/o problems with dizziness/lightheaded feeling like she was going to fall down. Stated came on this am when she woke up and only when she is up moving around. Patient denies headache or any other symptoms, has no way to check vital signs at home. Reviewed with Brynda Rim PA and recommend patient go to Urgent Care since she has no PCP. Advised patient and not to drive herself, verbalized understanding.

## 2014-08-13 NOTE — Patient Instructions (Addendum)
Your labs overall are ok here. Your blood count indicated a mild anemia - I will check some iron studies, and we can follow up to discuss this further in next few weeks. You should receive a call or letter about your lab results within the next week to 10 days.   I spoke to neurologist - CT of head today, then will check an MRI of the brain and carotid dopplers as an outpatient in next week. However, as discussed with neurology - as you are already on coumadin and aspirin, there would likely not be any medication changes if this were a TIA.   Follow up with me in next 2 weeks. I can serve as your primary care provider at this point.   Return to the clinic or go to the nearest emergency room if any of your symptoms recur, worsen or new symptoms occur.  CT scan at [facility], go to admission to register.  Dizziness Dizziness is a common problem. It is a feeling of unsteadiness or light-headedness. You may feel like you are about to faint. Dizziness can lead to injury if you stumble or fall. A person of any age group can suffer from dizziness, but dizziness is more common in older adults. CAUSES  Dizziness can be caused by many different things, including:  Middle ear problems.  Standing for too long.  Infections.  An allergic reaction.  Aging.  An emotional response to something, such as the sight of blood.  Side effects of medicines.  Tiredness.  Problems with circulation or blood pressure.  Excessive use of alcohol or medicines, or illegal drug use.  Breathing too fast (hyperventilation).  An irregular heart rhythm (arrhythmia).  A low red blood cell count (anemia).  Pregnancy.  Vomiting, diarrhea, fever, or other illnesses that cause body fluid loss (dehydration).  Diseases or conditions such as Parkinson's disease, high blood pressure (hypertension), diabetes, and thyroid problems.  Exposure to extreme heat. DIAGNOSIS  Your health care provider will ask  about your symptoms, perform a physical exam, and perform an electrocardiogram (ECG) to record the electrical activity of your heart. Your health care provider may also perform other heart or blood tests to determine the cause of your dizziness. These may include:  Transthoracic echocardiogram (TTE). During echocardiography, sound waves are used to evaluate how blood flows through your heart.  Transesophageal echocardiogram (TEE).  Cardiac monitoring. This allows your health care provider to monitor your heart rate and rhythm in real time.  Holter monitor. This is a portable device that records your heartbeat and can help diagnose heart arrhythmias. It allows your health care provider to track your heart activity for several days if needed.  Stress tests by exercise or by giving medicine that makes the heart beat faster. TREATMENT  Treatment of dizziness depends on the cause of your symptoms and can vary greatly. HOME CARE INSTRUCTIONS   Drink enough fluids to keep your urine clear or pale yellow. This is especially important in very hot weather. In older adults, it is also important in cold weather.  Take your medicine exactly as directed if your dizziness is caused by medicines. When taking blood pressure medicines, it is especially important to get up slowly.  Rise slowly from chairs and steady yourself until you feel okay.  In the morning, first sit up on the side of the bed. When you feel okay, stand slowly while holding onto something until you know your balance is fine.  Move your legs often  if you need to stand in one place for a long time. Tighten and relax your muscles in your legs while standing.  Have someone stay with you for 1-2 days if dizziness continues to be a problem. Do this until you feel you are well enough to stay alone. Have the person call your health care provider if he or she notices changes in you that are concerning.  Do not drive or use heavy machinery if you  feel dizzy.  Do not drink alcohol. SEEK IMMEDIATE MEDICAL CARE IF:   Your dizziness or light-headedness gets worse.  You feel nauseous or vomit.  You have problems talking, walking, or using your arms, hands, or legs.  You feel weak.  You are not thinking clearly or you have trouble forming sentences. It may take a friend or family member to notice this.  You have chest pain, abdominal pain, shortness of breath, or sweating.  Your vision changes.  You notice any bleeding.  You have side effects from medicine that seems to be getting worse rather than better. MAKE SURE YOU:   Understand these instructions.  Will watch your condition.  Will get help right away if you are not doing well or get worse. Document Released: 04/19/2001 Document Revised: 10/29/2013 Document Reviewed: 05/13/2011 [facility] Patient Information 2015 [place], [place]. This information is not intended to replace advice given to you by your health care provider. Make sure you discuss any questions you have with your health care provider.

## 2014-08-13 NOTE — Progress Notes (Addendum)
Subjective:    Patient ID: Renee Ray, female    DOB: 1941/06/09, 73 y.o.   MRN: [id number] This chart was scribed for Renee Ray, MD by Cathie Hoops, ED Scribe. The patient was seen in Room 13. The patient's care was started at 2:36 PM.   08/13/2014  Dizziness   Dizziness Pertinent negatives include no abdominal pain, chest pain, chills, fever, headaches, nausea, numbness, vomiting or weakness.   HPI Comments: 73 y.o female with hx of multiple medical problems including: 1.) Artrial fibrillation status post unsuccessful cardio-aversion she does take Coumadin with INR 3.4 yesterday, phone note to cardiologist this morning reviewed, where she was recommended to seek care here as no PCP. Asked to receive care here for dizziness and light-headedness as no PCP. 2.) Asthma which is followed by pulmonary including pulmonary rehab, per last cardiology visit on August 24 she had an echo in May with normal EF of 93-23% with diastolic dysfunction  3.) Diabetes: Pt also on metformin, Januvia, and Lantus for diabetes.  Today she presents to the [facility] complaining of acute, moderate and resolved dizziness onset 9 hours ago.  Pt notes associated light-headedness onset 9 hours ago. Pt denies hx of TIAs or stroke. Pt states when she woke this morning at 5:30 am to go to the restroom and felt dizzy and had to rest on the side of the bed. Pt notes when she woke up again at 7:00 am she still left dizzy but that resolved around noon this afternoon. Her dizziness did not resolve until approximately 3 hours ago. She notes she goes to the restroom 2-3x/night. Pt's mother had several stroke with the earliest being 39s, and multiple brothers with stroke at 80 and 78. Pt denies dizziness during her other trips to the bathroom. Pt denies palpations, chest pain, headache, slurred speech, drooping of the face, blurry vision, darkening on one side, hearing loss, SOB, weakness in the  extremities, otalgia, fever, chills, abdominal pain, dysuria, nausea or vomiting. Pt notes her blood sugar was 99 when she felt dizzy this morning and denies symptomatic lows. Pt will see Dr. Delrae Rend at the end of this month.  Pt had CT scan in July 20 2013, no acute abnormalities.   Patient Active Problem List   Diagnosis Date Noted  . Encounter for therapeutic drug monitoring 01/06/2014  . Upper respiratory infection 09/02/2013  . Heel spur 09/06/2012  . Acute medial meniscal tear 03/02/2012  . Hyperlipidemia 08/24/2011  . Benign hypertensive heart disease without heart failure 05/16/2011  . Asthma with COPD 09/03/2009  . DIABETES MELLITUS 09/04/2008  . CARDIOMYOPATHY 09/04/2008  . ATRIAL FIBRILLATION 09/04/2008  . ASTHMATIC BRONCHITIS, ACUTE 09/04/2008   Past Medical History  Diagnosis Date  . Hypertension   . Hyperlipidemia   . Chronic anticoagulation COUMADIN  . Normal nuclear stress test 2008  . Abnormal echocardiogram 2010    showing slightly enlarged left atrium with normal LV function and normal PA pressures.   . Chronic atrial fibrillation CARDIOLOGIST- DR BRACKBILL-- LAST VISIT  12-16-2011 IN EPIC  . Acute medial meniscal injury of knee RIGHT KNEE  . Asthmatic bronchitis , chronic   . OA (osteoarthritis) of knee HANDS  . History of pneumonia associated w/  rhabdomyolysis  OCT 2012  . COPD, moderate PULMOLOGIST- DR YOUNG --  LAST VISIT NOTE 02-23-2012 IN EPIC  . Bruise RIGHT ABD. DUE TO LOVENOX INJECTION  02-27-2012  . Chronic renal insufficiency   . Diabetes mellitus INSULIN  AND ORAL MEDS  . Non-ischemic cardiomyopathy normal ef  . COPD with asthma   . Atrial fibrillation    Past Surgical History  Procedure Laterality Date  . Breast enhancement surgery  1970    1990--- BREAST IMPLANTS REMOVED   . Vaginal hysterectomy  1978  . Knee arthroscopy  03/02/2012    Procedure: ARTHROSCOPY KNEE;  Surgeon: Gearlean Alf, MD;  Location: [facility];  Service: Orthopedics;  Laterality: Right;  WITH DEBRIDEMENT of medial menisces   Allergies  Allergen Reactions  . Janumet [Sitagliptin-Metformin Hcl] Swelling  . Amoxicillin Diarrhea  . Sulfonamide Derivatives Other (See Comments)    REACTION: thrush   Prior to Admission medications   Medication Sig Start Date End Date Taking? Authorizing Provider  albuterol (PROVENTIL HFA;VENTOLIN HFA) 108 (90 BASE) MCG/ACT inhaler Inhale 2 puffs into the lungs every 4 (four) hours as needed for wheezing or shortness of breath.   Yes Historical Provider, MD  aspirin 81 MG tablet Take 81 mg by mouth daily.    Yes Historical Provider, MD  b complex vitamins capsule Take 1 capsule by mouth daily.    Yes Historical Provider, MD  calcium carbonate (OS-CAL) 600 MG TABS Take 600 mg by mouth daily.    Yes Historical Provider, MD  cholecalciferol (VITAMIN D) 1000 UNITS tablet Take 1,000 Units by mouth daily.    Yes Historical Provider, MD  Fluticasone Furoate-Vilanterol (BREO ELLIPTA) 100-25 MCG/INH AEPB Inhale 1 puff into the lungs daily. 09/05/13  Yes Deneise Lever, MD  furosemide (LASIX) 40 MG tablet TAKE 1 TABLET BY MOUTH ONCE DAILY 07/17/14  Yes Darlin Coco, MD  glucose blood test strip as directed. 02/03/12  Yes Historical Provider, MD  insulin glargine (LANTUS) 100 UNIT/ML injection Inject 40 Units into the skin at bedtime.    Yes Historical Provider, MD  KLOR-CON M20 20 MEQ tablet TAKE 1 TABLET BY MOUTH ONCE DAILY 02/10/14  Yes Darlin Coco, MD  losartan-hydrochlorothiazide (HYZAAR) 100-25 MG per tablet TAKE 1 TABLET BY MOUTH DAILY.   Yes Darlin Coco, MD  metFORMIN (GLUCOPHAGE) 1000 MG tablet Take 1,000 mg by mouth 2 (two) times daily with a meal.    Yes Historical Provider, MD  Multiple Vitamin (MULTIVITAMIN) tablet Take 1 tablet by mouth daily.    Yes Historical Provider, MD  Omega-3 Fatty Acids (FISH OIL PO) Take 10 mLs by mouth daily.   Yes Historical Provider, MD  PROAIR HFA 108 (90  BASE) MCG/ACT inhaler INHALE 2 PUFFS EVERY 4 HOURS AS NEEDED. 11/03/13  Yes Deneise Lever, MD  rosuvastatin (CRESTOR) 5 MG tablet Take 1 tablet (5 mg total) by mouth daily. 10/09/13  Yes Darlin Coco, MD  sitaGLIPtin (JANUVIA) 50 MG tablet Take 50 mg by mouth daily.   Yes Historical Provider, MD  SPIRIVA HANDIHALER 18 MCG inhalation capsule INHALE 1 CAPSULE VIA HANDIHALER EVERY MORNING 12/10/13  Yes Deneise Lever, MD  warfarin (COUMADIN) 5 MG tablet 1 tablet daily except 2 tablets on Mondays. Or as directed 04/02/14  Yes Darlin Coco, MD   History   Social History  . Marital Status: Widowed    Spouse Name: N/A    Number of Children: N/A  . Years of Education: N/A   Occupational History  . Not on file.   Social History Main Topics  . Smoking status: Former Smoker -- 20 years    Types: Cigarettes    Quit date: 05/05/1996  . Smokeless tobacco: Never Used  . Alcohol  Use: No  . Drug Use: No  . Sexual Activity: No   Other Topics Concern  . Not on file   Social History Narrative  . No narrative on file   Review of Systems  Constitutional: Negative for fever and chills.  HENT: Negative for ear pain and hearing loss.   Respiratory: Negative for chest tightness and shortness of breath.   Cardiovascular: Negative for chest pain and palpitations.  Gastrointestinal: Negative for nausea, vomiting, abdominal pain and blood in stool.  Genitourinary: Negative for dysuria.  Neurological: Positive for dizziness and light-headedness. Negative for syncope, facial asymmetry, speech difficulty, weakness, numbness and headaches.   Objective:   Physical Exam  Vitals reviewed. Constitutional: She is oriented to person, place, and time. She appears well-developed and well-nourished.  HENT:  Head: Normocephalic and atraumatic.  Mouth/Throat: Oropharynx is clear and moist and mucous membranes are normal.  Equal palate elevation.  Eyes: Conjunctivae and EOM are normal. Pupils are equal,  round, and reactive to light.  No nystagmus.   Neck: Carotid bruit is not present.  Cardiovascular: Normal rate, regular rhythm, normal heart sounds and intact distal pulses.   No apparent murmur, but she has an irregular rhythm. Rate is approximately 90-100.  Pulmonary/Chest: Effort normal and breath sounds normal.  Abdominal: Soft. She exhibits no pulsatile midline mass. There is no tenderness.  Musculoskeletal:  Trace pedal edema.  Neurological: She is alert and oriented to person, place, and time. She displays a negative Romberg sign.  Equal strength with face upper and lower extremities. Negative Romberg's. No pronator drift. Normal finger-to-nose. On heel-to-toe, initial unsteadiness but corrects to normal exam.  Skin: Skin is warm and dry.  Psychiatric: She has a normal mood and affect. Her behavior is normal.   Filed Vitals:   08/13/14 1341  BP: 132/70  Pulse: 92  Temp: 98.4 F (36.9 C)  TempSrc: Oral  Resp: 18  Height: 5' 2.5" (1.588 m)  Weight: 197 lb (89.359 kg)  SpO2: 96%   3:18 PM - EKG compared to March 2015, atrial fibrillation rate 95, some baseline artifact, otherwise no apparent change from prior.   Results for orders placed in visit on 08/13/14  POCT CBC      Result Value Ref Range   WBC 8.1  4.6 - 10.2 K/uL   Lymph, poc 1.9  0.6 - 3.4   POC LYMPH PERCENT 23.6  10 - 50 %L   MID (cbc) 0.8  0 - 0.9   POC MID % 0.6  0 - 12 %M   POC Granulocyte 5.4  2 - 6.9   Granulocyte percent 66.8  37 - 80 %G   RBC 5.13  4.04 - 5.48 M/uL   Hemoglobin 11.1 (*) 12.2 - 16.2 g/dL   HCT, POC 36.9 (*) 37.7 - 47.9 %   MCV 71.8 (*) 80 - 97 fL   MCH, POC 21.6 (*) 27 - 31.2 pg   MCHC 30.1 (*) 31.8 - 35.4 g/dL   RDW, POC 19.8     Platelet Count, POC 373  142 - 424 K/uL   MPV 7.0  0 - 99.8 fL  GLUCOSE, POCT (MANUAL RESULT ENTRY)      Result Value Ref Range   POC Glucose 100 (*) 70 - 99 mg/dl   Labs above reviewed. hgb stable from 11.6 in past few years.   Assessment & Plan:    2:52 PM- Patient informed of current plan for treatment and evaluation and agrees with plan at  this time. KENNETHA PEARMAN is a 74 y.o. female Dizziness - Plan: POCT CBC, POCT glucose (manual entry), Basic metabolic panel, TSH, EKG 49-SWHQ, CT Head Wo Contrast, MR Brain Wo Contrast, Carotid duplex  -8-9 hours of symptoms of dizziness only without focal weakness, blood sugar at the time was normal. No acute findings on exam, and overall reassuring EKG and blood test in office. DDX includes TIA, and discussed with neurologist but as already on Coumadin that is supratheraputic and aspirin, most likely no new medication would be prescribed, will check CT scan to rule out ICB, then MRI and carotid dopplers as an outpatient ER, return to clinic precautions discussed.  -CT report noted: IMPRESSION: Negative noncontrast head CT.  Atrial fibrillation, unspecified  -Stable EKG in office and asymptomatic.  Type 2 diabetes mellitus without complication - Plan: POCT glucose (manual entry)  -Glucose okay in office and followed by Dr. Buddy Duty.   Family history of stroke - Plan: MR Brain Wo Contrast, Carotid duplex  -As above.  Bleeding tendency - Plan: CT Head Wo Contrast, MR Brain Wo Contrast  -CT scan pending as above, continue follow up with Coumadin clinic.  Anemia, unspecified anemia type - Plan: IBC Panel  -Appears overall stable from prior readings, but will check iron readings and will return to clinic to discuss further in next few weeks.  I agreed to be her primary care provider at this time. Recheck in two weeks.  No orders of the defined types were placed in this encounter.   Patient Instructions  Your labs overall are ok here. Your blood count indicated a mild anemia - I will check some iron studies, and we can follow up to discuss this further in next few weeks. You should receive a call or letter about your lab results within the next week to 10 days.   I spoke to neurologist - CT of head  today, then will check an MRI of the brain and carotid dopplers as an outpatient in next week. However, as discussed with neurology - as you are already on coumadin and aspirin, there would likely not be any medication changes if this were a TIA.   Follow up with me in next 2 weeks. I can serve as your primary care provider at this point.   Return to the clinic or go to the nearest emergency room if any of your symptoms recur, worsen or new symptoms occur.  Dizziness Dizziness is a common problem. It is a feeling of unsteadiness or light-headedness. You may feel like you are about to faint. Dizziness can lead to injury if you stumble or fall. A person of any age group can suffer from dizziness, but dizziness is more common in older adults. CAUSES  Dizziness can be caused by many different things, including:  Middle ear problems.  Standing for too long.  Infections.  An allergic reaction.  Aging.  An emotional response to something, such as the sight of blood.  Side effects of medicines.  Tiredness.  Problems with circulation or blood pressure.  Excessive use of alcohol or medicines, or illegal drug use.  Breathing too fast (hyperventilation).  An irregular heart rhythm (arrhythmia).  A low red blood cell count (anemia).  Pregnancy.  Vomiting, diarrhea, fever, or other illnesses that cause body fluid loss (dehydration).  Diseases or conditions such as Parkinson's disease, high blood pressure (hypertension), diabetes, and thyroid problems.  Exposure to extreme heat. DIAGNOSIS  Your health care provider will ask about your symptoms,  perform a physical exam, and perform an electrocardiogram (ECG) to record the electrical activity of your heart. Your health care provider may also perform other heart or blood tests to determine the cause of your dizziness. These may include:  Transthoracic echocardiogram (TTE). During echocardiography, sound waves are used to evaluate how blood  flows through your heart.  Transesophageal echocardiogram (TEE).  Cardiac monitoring. This allows your health care provider to monitor your heart rate and rhythm in real time.  Holter monitor. This is a portable device that records your heartbeat and can help diagnose heart arrhythmias. It allows your health care provider to track your heart activity for several days if needed.  Stress tests by exercise or by giving medicine that makes the heart beat faster. TREATMENT  Treatment of dizziness depends on the cause of your symptoms and can vary greatly. HOME CARE INSTRUCTIONS   Drink enough fluids to keep your urine clear or pale yellow. This is especially important in very hot weather. In older adults, it is also important in cold weather.  Take your medicine exactly as directed if your dizziness is caused by medicines. When taking blood pressure medicines, it is especially important to get up slowly.  Rise slowly from chairs and steady yourself until you feel okay.  In the morning, first sit up on the side of the bed. When you feel okay, stand slowly while holding onto something until you know your balance is fine.  Move your legs often if you need to stand in one place for a long time. Tighten and relax your muscles in your legs while standing.  Have someone stay with you for 1-2 days if dizziness continues to be a problem. Do this until you feel you are well enough to stay alone. Have the person call your health care provider if he or she notices changes in you that are concerning.  Do not drive or use heavy machinery if you feel dizzy.  Do not drink alcohol. SEEK IMMEDIATE MEDICAL CARE IF:   Your dizziness or light-headedness gets worse.  You feel nauseous or vomit.  You have problems talking, walking, or using your arms, hands, or legs.  You feel weak.  You are not thinking clearly or you have trouble forming sentences. It may take a friend or family member to notice  this.  You have chest pain, abdominal pain, shortness of breath, or sweating.  Your vision changes.  You notice any bleeding.  You have side effects from medicine that seems to be getting worse rather than better. MAKE SURE YOU:   Understand these instructions.  Will watch your condition.  Will get help right away if you are not doing well or get worse. Document Released: 04/19/2001 Document Revised: 10/29/2013 Document Reviewed: 05/13/2011 [facility] Patient Information 2015 [place], [place]. This information is not intended to replace advice given to you by your health care provider. Make sure you discuss any questions you have with your health care provider.      I personally performed the services described in this documentation, which was scribed in my presence. The recorded information has been reviewed and considered, and addended by me as needed.

## 2014-08-14 ENCOUNTER — Telehealth: Payer: Self-pay | Admitting: Cardiology

## 2014-08-14 ENCOUNTER — Encounter (HOSPITAL_COMMUNITY): Payer: Self-pay

## 2014-08-14 LAB — IBC PANEL
%SAT: 5 % — ABNORMAL LOW (ref 20–55)
TIBC: 445 ug/dL (ref 250–470)
UIBC: 423 ug/dL — AB (ref 125–400)

## 2014-08-14 LAB — BASIC METABOLIC PANEL
BUN: 19 mg/dL (ref 6–23)
CALCIUM: 9.6 mg/dL (ref 8.4–10.5)
CHLORIDE: 104 meq/L (ref 96–112)
CO2: 28 meq/L (ref 19–32)
CREATININE: 0.69 mg/dL (ref 0.50–1.10)
Glucose, Bld: 86 mg/dL (ref 70–99)
Potassium: 3.9 mEq/L (ref 3.5–5.3)
Sodium: 144 mEq/L (ref 135–145)

## 2014-08-14 LAB — TSH: TSH: 0.026 u[IU]/mL — ABNORMAL LOW (ref 0.350–4.500)

## 2014-08-14 LAB — IRON: Iron: 22 ug/dL — ABNORMAL LOW (ref 42–145)

## 2014-08-14 NOTE — Telephone Encounter (Signed)
Pt called she said was seen by her PCP she had to spend 4 hours. She had an EKG,CT scan of the head and now the PCP wants to do an MRI. Pt  does not want to  Have all that done. Pt would like for  Alvina Filbert Dr Sherryl Barters nurse to call her tomorrow. She like to be call in the AM, because she is working in the afternoon.

## 2014-08-14 NOTE — Telephone Encounter (Signed)
New message    Patient calling went to urgent care on yesterday has some questions.

## 2014-08-15 NOTE — Telephone Encounter (Signed)
I reviewed her recent visit with her PCP.  I agree she should have the MRI and the carotid duplex. She has not had a prior duplex in the chart and with her diabetes and other risk factors these tests will be helpful.

## 2014-08-15 NOTE — Telephone Encounter (Signed)
Spoke with patient and she did go to Urgent Care Wednesday for her dizziness/lightheadedness. Physician there would like for her to have MRI and doppler but she would like for Dr. Mare Ferrari to review and make sure he feels necessary. She does understand with her family history he was trying to be thorough but she just doesn't know if it is necessary. Will forward to  Dr. Mare Ferrari for review.

## 2014-08-18 NOTE — Telephone Encounter (Signed)
Advised patient, verbalized understanding  

## 2014-08-19 ENCOUNTER — Encounter (HOSPITAL_COMMUNITY)
Admission: RE | Admit: 2014-08-19 | Discharge: 2014-08-19 | Disposition: A | Discharge status: Home / Self Care | Payer: Self-pay | Source: Ambulatory Visit | Attending: Internal Medicine | Admitting: Internal Medicine

## 2014-08-21 ENCOUNTER — Encounter (HOSPITAL_COMMUNITY)
Admission: RE | Admit: 2014-08-21 | Discharge: 2014-08-21 | Disposition: A | Discharge status: Home / Self Care | Payer: Self-pay | Source: Ambulatory Visit | Attending: Internal Medicine | Admitting: Internal Medicine

## 2014-08-26 ENCOUNTER — Ambulatory Visit (INDEPENDENT_AMBULATORY_CARE_PROVIDER_SITE_OTHER): Payer: Medicare Other | Admitting: Pharmacist Clinician (PhC)/ Clinical Pharmacy Specialist

## 2014-08-26 ENCOUNTER — Encounter (HOSPITAL_COMMUNITY): Payer: Self-pay

## 2014-08-26 DIAGNOSIS — I4891 Unspecified atrial fibrillation: Secondary | ICD-10-CM

## 2014-08-26 DIAGNOSIS — R899 Unspecified abnormal finding in specimens from other organs, systems and tissues: Secondary | ICD-10-CM

## 2014-08-26 DIAGNOSIS — Z5181 Encounter for therapeutic drug level monitoring: Secondary | ICD-10-CM

## 2014-08-26 LAB — POCT INR: INR: 2.7

## 2014-08-28 ENCOUNTER — Encounter (HOSPITAL_COMMUNITY)
Admission: RE | Admit: 2014-08-28 | Discharge: 2014-08-28 | Disposition: A | Discharge status: Home / Self Care | Payer: Self-pay | Source: Ambulatory Visit | Attending: Internal Medicine | Admitting: Internal Medicine

## 2014-08-29 ENCOUNTER — Ambulatory Visit
Admission: RE | Admit: 2014-08-29 | Discharge: 2014-08-29 | Disposition: A | Discharge status: Home / Self Care | Payer: Medicare Other | Source: Ambulatory Visit | Attending: Family Medicine | Admitting: Family Medicine

## 2014-08-29 DIAGNOSIS — R42 Dizziness and giddiness: Secondary | ICD-10-CM

## 2014-08-29 DIAGNOSIS — Z823 Family history of stroke: Secondary | ICD-10-CM

## 2014-08-29 DIAGNOSIS — D699 Hemorrhagic condition, unspecified: Secondary | ICD-10-CM

## 2014-09-02 ENCOUNTER — Encounter (HOSPITAL_COMMUNITY)
Admission: RE | Admit: 2014-09-02 | Discharge: 2014-09-02 | Disposition: A | Discharge status: Home / Self Care | Payer: Self-pay | Source: Ambulatory Visit | Attending: Internal Medicine | Admitting: Internal Medicine

## 2014-09-03 ENCOUNTER — Ambulatory Visit (HOSPITAL_COMMUNITY)
Admission: RE | Admit: 2014-09-03 | Discharge: 2014-09-03 | Disposition: A | Discharge status: Home / Self Care | Payer: Medicare Other | Source: Ambulatory Visit | Attending: Vascular Surgery | Admitting: Vascular Surgery

## 2014-09-03 ENCOUNTER — Other Ambulatory Visit: Payer: Self-pay | Admitting: Family Medicine

## 2014-09-03 DIAGNOSIS — R42 Dizziness and giddiness: Secondary | ICD-10-CM | POA: Diagnosis not present

## 2014-09-03 DIAGNOSIS — Z823 Family history of stroke: Secondary | ICD-10-CM

## 2014-09-04 ENCOUNTER — Encounter (HOSPITAL_COMMUNITY)
Admission: RE | Admit: 2014-09-04 | Discharge: 2014-09-04 | Disposition: A | Discharge status: Home / Self Care | Payer: Self-pay | Source: Ambulatory Visit | Attending: Internal Medicine | Admitting: Internal Medicine

## 2014-09-09 ENCOUNTER — Encounter (HOSPITAL_COMMUNITY): Payer: Medicare Other

## 2014-09-09 DIAGNOSIS — J449 Chronic obstructive pulmonary disease, unspecified: Secondary | ICD-10-CM | POA: Insufficient documentation

## 2014-09-09 DIAGNOSIS — Z5189 Encounter for other specified aftercare: Secondary | ICD-10-CM | POA: Insufficient documentation

## 2014-09-09 DIAGNOSIS — E119 Type 2 diabetes mellitus without complications: Secondary | ICD-10-CM | POA: Insufficient documentation

## 2014-09-09 DIAGNOSIS — E785 Hyperlipidemia, unspecified: Secondary | ICD-10-CM | POA: Insufficient documentation

## 2014-09-09 DIAGNOSIS — I119 Hypertensive heart disease without heart failure: Secondary | ICD-10-CM | POA: Insufficient documentation

## 2014-09-09 DIAGNOSIS — J45909 Unspecified asthma, uncomplicated: Secondary | ICD-10-CM | POA: Insufficient documentation

## 2014-09-09 DIAGNOSIS — I429 Cardiomyopathy, unspecified: Secondary | ICD-10-CM | POA: Insufficient documentation

## 2014-09-09 DIAGNOSIS — I4891 Unspecified atrial fibrillation: Secondary | ICD-10-CM | POA: Insufficient documentation

## 2014-09-11 ENCOUNTER — Encounter (HOSPITAL_COMMUNITY): Payer: Self-pay

## 2014-09-16 ENCOUNTER — Encounter (HOSPITAL_COMMUNITY): Payer: Self-pay

## 2014-09-18 ENCOUNTER — Encounter (HOSPITAL_COMMUNITY)
Admission: RE | Admit: 2014-09-18 | Discharge: 2014-09-18 | Disposition: A | Discharge status: Home / Self Care | Payer: Self-pay | Source: Ambulatory Visit | Attending: Internal Medicine | Admitting: Internal Medicine

## 2014-09-18 ENCOUNTER — Ambulatory Visit (INDEPENDENT_AMBULATORY_CARE_PROVIDER_SITE_OTHER): Payer: Medicare Other | Admitting: Pharmacist Clinician (PhC)/ Clinical Pharmacy Specialist

## 2014-09-18 ENCOUNTER — Telehealth: Payer: Self-pay | Admitting: *Deleted

## 2014-09-18 DIAGNOSIS — I4891 Unspecified atrial fibrillation: Secondary | ICD-10-CM

## 2014-09-18 DIAGNOSIS — R899 Unspecified abnormal finding in specimens from other organs, systems and tissues: Secondary | ICD-10-CM

## 2014-09-18 DIAGNOSIS — Z5181 Encounter for therapeutic drug level monitoring: Secondary | ICD-10-CM

## 2014-09-18 LAB — POCT INR: INR: 2.4

## 2014-09-18 NOTE — Telephone Encounter (Signed)
Crestor samples provided for patient.

## 2014-09-23 ENCOUNTER — Encounter (INDEPENDENT_AMBULATORY_CARE_PROVIDER_SITE_OTHER): Payer: Self-pay

## 2014-09-23 ENCOUNTER — Encounter: Payer: Self-pay | Admitting: Internal Medicine

## 2014-09-23 ENCOUNTER — Other Ambulatory Visit: Payer: Self-pay | Admitting: Cardiology

## 2014-09-23 ENCOUNTER — Ambulatory Visit (INDEPENDENT_AMBULATORY_CARE_PROVIDER_SITE_OTHER): Payer: Medicare Other | Admitting: Internal Medicine

## 2014-09-23 ENCOUNTER — Ambulatory Visit (INDEPENDENT_AMBULATORY_CARE_PROVIDER_SITE_OTHER)
Admission: RE | Admit: 2014-09-23 | Discharge: 2014-09-23 | Disposition: A | Discharge status: Home / Self Care | Payer: Medicare Other | Source: Ambulatory Visit | Attending: Internal Medicine | Admitting: Internal Medicine

## 2014-09-23 ENCOUNTER — Encounter (HOSPITAL_COMMUNITY)
Admission: RE | Admit: 2014-09-23 | Discharge: 2014-09-23 | Disposition: A | Discharge status: Home / Self Care | Payer: Self-pay | Source: Ambulatory Visit | Attending: Internal Medicine | Admitting: Internal Medicine

## 2014-09-23 VITALS — BP 140/82 | HR 87 | Ht 64.0 in | Wt 201.0 lb

## 2014-09-23 DIAGNOSIS — J449 Chronic obstructive pulmonary disease, unspecified: Secondary | ICD-10-CM

## 2014-09-23 MED ORDER — PREDNISONE (PAK) 5 MG PO TABS: ORAL_TABLET | ORAL | Status: DC

## 2014-09-23 NOTE — Progress Notes (Signed)
09/01/11- 73 year old female former smoker followed for asthmatic bronchitis complicated by DM, CM, atrial fibrillation. Widow of Dr. Herbie Baltimore Stokely/neurosurgeon. She continues to work part-time at [employer]. Has had flu vaccine. Has had pneumonia vaccine. Post hospital visit now after episode of syncope with rhabdomyolysis. She feels back to normal and is able to work and to get around with her usual activities. She now has a Medic alert device. Had renal insufficiency which is being tracked by Dr.Kerr, who follows up for diabetes. He has seen her and reports stable renal and liver function. She denies cough, wheeze, chest pain, palpitation or swelling. CXR 08/25/2011-mild CE, hyperinflation, bronchitis with some atelectasis left base.  10/27/11-73 year old female former smoker followed for asthmatic bronchitis complicated by DM, CM, atrial fibrillation. Widow of Dr. Herbie Baltimore Louque/neurosurgeon. She continues to work part-time at [employer]. Has had flu vaccine. Has had pneumonia vaccine. Malaise yesterday. Today woke w/ temp 101.4, cough productive green. Nose blowing clear. Scratchy throat. No muscle ache.  PFT-10/05/11- moderate COPD- FEV1/FVC0.61, DLCO 74%. She wants to re-do pulmonary rehab.  02/23/12- 73 year old female former smoker followed for asthmatic bronchitis complicated by DM, CM, atrial fibrillation. Widow of Dr. Herbie Baltimore Christoffersen/neurosurgeon. She continues to work part-time at [employer]. :"doing good overall"; recently had bronchitis-took Zpak and round of Prednisone and helped clear it up Not bothered by spring pollens. Satisfied with Spiriva and rare use of Proair.Back in Pulmonary Rehab and says it helps her.  For R knee arthroscopy with no pulmonary issues anticipated.  IMPRESSION: CXR 11/02/11 Stable. Emphysema without acute findings.  Original Report Authenticated By: ERIC A. MANSELL, M.D.   08/23/12- 73 year old female former smoker followed for asthmatic  bronchitis complicated by DM, CM, atrial fibrillation. Widow of Dr. Herbie Baltimore Cadenas/neurosurgeon Denies any flare ups; no SOB or wheezing as well as no cough or congestion. She says she is head "no issues at all". Has had flu vaccine. Continues pulmonary rehabilitation. She is pleased with her status.  09/05/13- 73 year old female former smoker followed for asthmatic bronchitis complicated by DM, CM, atrial fibrillation. Widow of Dr. Herbie Baltimore Pelaez/neurosurgeon FOLLOWS FOR:  Increased SOB x2 weeks.  Presently with sinus infection and possibly bronchitis. Dr Mare Ferrari gave Z-Pak to hold. She definitely caught this at work. Denies fever. Feels congestion still in head and chest. She wants to wait on Z-Pak until her Coumadin checked next week. Otherwise has done well. 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 Widow of Dr. Herbie Baltimore Munshi/neurosurgeon FOLLOWS FOR: Slight sinus issues with cough today; continues to have SOB with exertion as well.  Had flu vax. Continues Pulm Rehab- discussed. Keeps small supply prednisone, taking 5 mg very occasionally seems to control mild wheeze/ dyspnea. Uses inhalers prn.  CXR 09/05/13 FINDINGS: Borderline cardiomegaly, unchanged. Chronic peribronchial thickening. Pectus carinatum deformity. Lungs are hyperinflated consistent with emphysema. No acute osseous abnormality. No infiltrates or effusions. IMPRESSION: Emphysema and chronic peribronchial thickening. No acute abnormalities. Electronically Signed  By: Rozetta Nunnery M.D.  On: 09/05/2013 10:28   ROS-see HPI Constitutional:   No-   weight loss, night sweats, +fevers, chills, fatigue, lassitude. HEENT:   No-  headaches, difficulty swallowing, tooth/dental problems, sore throat,       No-  sneezing, itching, ear ache, +nasal congestion, post nasal  drip,  CV:  No-   chest pain, orthopnea, PND, swelling in lower extremities, anasarca, dizziness, palpitations Resp:  +Some shortness  of breath with exertion or at rest.              No-productive cough,  No non-productive cough,  No- coughing up of blood.              No-   change in color of mucus.  + wheezing.   Skin: No-   rash or lesions. GI:  No-   heartburn, indigestion, abdominal pain, nausea, vomiting,  GU: MS:  No-   joint pain or swelling.   Neuro-     nothing unusual Psych:  No- change in mood or affect. No depression or anxiety.  No memory loss.  OBJ General- Alert, Oriented, Affect-appropriate, Distress- none acute.  Skin- rash-none, lesions- none, excoriation- none; warm  Lymphadenopathy- none Head- atraumatic            Eyes- Gross vision intact, PERRLA, conjunctivae clear secretions            Ears- Hearing, canals-normal            Nose- Clear, no-Septal dev, mucus, polyps, erosion, perforation             Throat- Mallampati II , mucosa red , drainage- none, tonsils- atrophic Neck- flexible , trachea midline, no stridor , thyroid nl, carotid no bruit Chest - symmetrical excursion , unlabored           Heart/CV- AF/ IRR , no murmur , no gallop  , no rub, nl s1 s2                           - JVD- none , edema- none, stasis changes- none, varices- none           Lung- + sounds  distant, wheeze+ mild, unlabored, cough- none , dullness-none, rub- none           Chest wall-  Abd- Br/ Gen/ Rectal- Not done, not indicated Extrem- cyanosis- none, clubbing, none, atrophy- none, strength- nl Neuro- grossly intact to observation

## 2014-09-23 NOTE — Patient Instructions (Signed)
Prednisone refilled to keep available as discussed  Order CXR  [facility] with COPD  Please call as needed

## 2014-09-25 ENCOUNTER — Encounter (HOSPITAL_COMMUNITY): Payer: Self-pay

## 2014-09-30 ENCOUNTER — Encounter (HOSPITAL_COMMUNITY)
Admission: RE | Admit: 2014-09-30 | Discharge: 2014-09-30 | Disposition: A | Discharge status: Home / Self Care | Payer: Self-pay | Source: Ambulatory Visit | Attending: Internal Medicine | Admitting: Internal Medicine

## 2014-10-02 ENCOUNTER — Encounter (HOSPITAL_COMMUNITY): Payer: Self-pay

## 2014-10-07 ENCOUNTER — Encounter (HOSPITAL_COMMUNITY): Payer: Medicare Other

## 2014-10-07 DIAGNOSIS — I119 Hypertensive heart disease without heart failure: Secondary | ICD-10-CM | POA: Insufficient documentation

## 2014-10-07 DIAGNOSIS — I429 Cardiomyopathy, unspecified: Secondary | ICD-10-CM | POA: Insufficient documentation

## 2014-10-07 DIAGNOSIS — E785 Hyperlipidemia, unspecified: Secondary | ICD-10-CM | POA: Insufficient documentation

## 2014-10-07 DIAGNOSIS — E049 Nontoxic goiter, unspecified: Secondary | ICD-10-CM

## 2014-10-07 DIAGNOSIS — I4891 Unspecified atrial fibrillation: Secondary | ICD-10-CM | POA: Insufficient documentation

## 2014-10-07 DIAGNOSIS — J45909 Unspecified asthma, uncomplicated: Secondary | ICD-10-CM | POA: Insufficient documentation

## 2014-10-07 DIAGNOSIS — J449 Chronic obstructive pulmonary disease, unspecified: Secondary | ICD-10-CM | POA: Insufficient documentation

## 2014-10-07 DIAGNOSIS — Z5189 Encounter for other specified aftercare: Secondary | ICD-10-CM | POA: Insufficient documentation

## 2014-10-07 DIAGNOSIS — E119 Type 2 diabetes mellitus without complications: Secondary | ICD-10-CM | POA: Insufficient documentation

## 2014-10-07 HISTORY — DX: Nontoxic goiter, unspecified: E04.9

## 2014-10-09 ENCOUNTER — Telehealth: Payer: Self-pay | Admitting: Cardiology

## 2014-10-09 ENCOUNTER — Ambulatory Visit (INDEPENDENT_AMBULATORY_CARE_PROVIDER_SITE_OTHER): Payer: Medicare Other | Admitting: Cardiology

## 2014-10-09 ENCOUNTER — Encounter: Payer: Self-pay | Admitting: Cardiology

## 2014-10-09 ENCOUNTER — Encounter (HOSPITAL_COMMUNITY): Payer: Self-pay

## 2014-10-09 ENCOUNTER — Other Ambulatory Visit (INDEPENDENT_AMBULATORY_CARE_PROVIDER_SITE_OTHER): Payer: Medicare Other | Admitting: *Deleted

## 2014-10-09 VITALS — BP 148/82 | HR 78 | Ht 64.0 in | Wt 196.0 lb

## 2014-10-09 DIAGNOSIS — I482 Chronic atrial fibrillation, unspecified: Secondary | ICD-10-CM

## 2014-10-09 DIAGNOSIS — I119 Hypertensive heart disease without heart failure: Secondary | ICD-10-CM

## 2014-10-09 DIAGNOSIS — D509 Iron deficiency anemia, unspecified: Secondary | ICD-10-CM | POA: Insufficient documentation

## 2014-10-09 DIAGNOSIS — J069 Acute upper respiratory infection, unspecified: Secondary | ICD-10-CM

## 2014-10-09 LAB — CBC WITH DIFFERENTIAL/PLATELET
BASOS PCT: 0.2 % (ref 0.0–3.0)
Basophils Absolute: 0 10*3/uL (ref 0.0–0.1)
EOS PCT: 2.6 % (ref 0.0–5.0)
Eosinophils Absolute: 0.2 10*3/uL (ref 0.0–0.7)
HCT: 33.2 % — ABNORMAL LOW (ref 36.0–46.0)
Hemoglobin: 10.3 g/dL — ABNORMAL LOW (ref 12.0–15.0)
LYMPHS ABS: 1.4 10*3/uL (ref 0.7–4.0)
LYMPHS PCT: 18.2 % (ref 12.0–46.0)
MCHC: 31 g/dL (ref 30.0–36.0)
MCV: 71.4 fl — ABNORMAL LOW (ref 78.0–100.0)
MONOS PCT: 12.9 % — AB (ref 3.0–12.0)
Monocytes Absolute: 1 10*3/uL (ref 0.1–1.0)
Neutro Abs: 4.9 10*3/uL (ref 1.4–7.7)
Neutrophils Relative %: 66.1 % (ref 43.0–77.0)
Platelets: 349 10*3/uL (ref 150.0–400.0)
RBC: 4.64 Mil/uL (ref 3.87–5.11)
RDW: 18.9 % — AB (ref 11.5–15.5)
WBC: 7.5 10*3/uL (ref 4.0–10.5)

## 2014-10-09 LAB — BASIC METABOLIC PANEL
BUN: 22 mg/dL (ref 6–23)
CHLORIDE: 105 meq/L (ref 96–112)
CO2: 26 meq/L (ref 19–32)
CREATININE: 0.7 mg/dL (ref 0.4–1.2)
Calcium: 9.2 mg/dL (ref 8.4–10.5)
GFR: 81.5 mL/min (ref >60.00)
Glucose, Bld: 115 mg/dL — ABNORMAL HIGH (ref 70–99)
Potassium: 4 mEq/L (ref 3.5–5.1)
SODIUM: 141 meq/L (ref 135–145)

## 2014-10-09 LAB — HEPATIC FUNCTION PANEL
ALBUMIN: 3.7 g/dL (ref 3.5–5.2)
ALK PHOS: 77 U/L (ref 39–117)
ALT: 26 U/L (ref 0–35)
AST: 28 U/L (ref 0–37)
BILIRUBIN DIRECT: 0 mg/dL (ref 0.0–0.3)
TOTAL PROTEIN: 7.2 g/dL (ref 6.0–8.3)
Total Bilirubin: 0.5 mg/dL (ref 0.2–1.2)

## 2014-10-09 LAB — LIPID PANEL
CHOL/HDL RATIO: 3
Cholesterol: 158 mg/dL (ref 0–200)
HDL: 49.8 mg/dL (ref >39.00)
LDL Cholesterol: 86 mg/dL (ref 0–99)
NONHDL: 108.2
Triglycerides: 110 mg/dL (ref 0.0–149.0)
VLDL: 22 mg/dL (ref 0.0–40.0)

## 2014-10-09 NOTE — Patient Instructions (Signed)
Will obtain labs today and call you with the results (cbc)  Your physician recommends that you continue on your current medications as directed. Please refer to the Current Medication list given to you today.  Your physician wants you to follow-up in: 4 months with fasting labs (lp/bmet/hfp/cbc)  You will receive a reminder letter in the mail two months in advance. If you don't receive a letter, please call our office to schedule the follow-up appointment.

## 2014-10-09 NOTE — Telephone Encounter (Signed)
New Msg  Pt calling, states she contacted Dr. Cindra Eves office about cbc, and she wants to inform that her cbc was 10.9 on 11/30.  Pt may be contacted at [phone] if needed.

## 2014-10-09 NOTE — Progress Notes (Signed)
Quick Note:  Please report to patient. The recent labs are stable. Continue same medication and careful diet. ______ 

## 2014-10-09 NOTE — Progress Notes (Signed)
Quick Note:  Please report to patient. The recent labs are stable. Continue same medication and careful diet. Hemoglobin 10.3 not much different from prior years. ______

## 2014-10-09 NOTE — Progress Notes (Signed)
Rudi Heap Date of Birth:  20-Jul-1941 [place], [place]  [address] [phone]  Fax   [phone]  HPI: This pleasant 73 year old woman is seen for a scheduled four-month followup office visit. She has a history of diabetes. Her diabetes is followed by Dr. Buddy Duty. She has a history of high blood pressure and a history of established chronic atrial fibrillation. She had a remote attempt at cardioversion which was unsuccessful. She has not been having any symptoms from her atrial fibrillation. He also has a history of asthma and is followed by pulmonary. She participates in the pulmonary rehabilitation program. Since last visit she has been feeling well. 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  She had an echocardiogram in 03/12/14 which showed normal ejection fraction of 60-65% and there was  diastolic dysfunction. Recently she had some blood work at her PCP office and was found to have anemia.  Workup of this showed that it was an iron deficiency anemia.  She was told at that time that she would probably need to have a GI workup.  She has not been aware of any hematochezia or melena.  She was placed empirically on oral iron therapy.  She has not been taking the iron tablets on a consistent basis because of side effects of constipation.  She has felt more fatigued and due to her anemia.  She has never had colonoscopy.  There is no family history of colon cancer although her mother did have colon polyps.  Current Outpatient Prescriptions  Medication Sig Dispense Refill  . aspirin 81 MG tablet Take 81 mg by mouth daily.     [name] b complex vitamins capsule Take 1 capsule by mouth daily.     . calcium carbonate (OS-CAL) 600 MG TABS Take 600 mg by mouth daily.     . cholecalciferol (VITAMIN D) 1000 UNITS tablet Take 1,000 Units by mouth daily.     . ferrous sulfate 325 (65 FE) MG tablet Take 325 mg by  mouth daily with breakfast.    . furosemide (LASIX) 40 MG tablet TAKE 1 TABLET BY MOUTH ONCE DAILY 90 tablet 0  . glucose blood test strip as directed.    . insulin glargine (LANTUS) 100 UNIT/ML injection Inject 40 Units into the skin at bedtime.     [name] KLOR-CON M20 20 MEQ tablet TAKE 1 TABLET BY MOUTH ONCE DAILY 30 tablet 6  . losartan-hydrochlorothiazide (HYZAAR) 100-25 MG per tablet TAKE 1 TABLET BY MOUTH DAILY. 90 tablet 1  . metFORMIN (GLUCOPHAGE) 1000 MG tablet Take 1000mg  every morning and 500mg  every evening    . Multiple Vitamin (MULTIVITAMIN) tablet Take 1 tablet by mouth daily.     . Omega-3 Fatty Acids (FISH OIL PO) Take 10 mLs by mouth daily.    . predniSONE (STERAPRED UNI-PAK) 5 MG TABS tablet 1 or 2 daily as directed 25 each 0  . PROAIR HFA 108 (90 BASE) MCG/ACT inhaler INHALE 2 PUFFS EVERY 4 HOURS AS NEEDED. 8.5 each 5  . rosuvastatin (CRESTOR) 5 MG tablet Take 1 tablet (5 mg total) by mouth daily. 30 tablet 5  . sitaGLIPtin (JANUVIA) 50 MG tablet Take 50 mg by mouth daily.    [name] SPIRIVA HANDIHALER 18 MCG inhalation capsule INHALE 1 CAPSULE VIA HANDIHALER EVERY MORNING 30 capsule 4  . warfarin (COUMADIN) 5 MG tablet 1 tablet daily except 2 tablets on Mondays. Or as  directed 120 tablet 1   No current facility-administered medications for this visit.    Allergies  Allergen Reactions  . Janumet [Sitagliptin-Metformin Hcl] Swelling  . Amoxicillin Diarrhea  . Sulfonamide Derivatives Other (See Comments)    REACTION: thrush    Patient Active Problem List   Diagnosis Date Noted  . Iron deficiency anemia 10/09/2014  . Encounter for therapeutic drug monitoring 01/06/2014  . Upper respiratory infection 09/02/2013  . Heel spur 09/06/2012  . Acute medial meniscal tear 03/02/2012  . Hyperlipidemia 08/24/2011  . Benign hypertensive heart disease without heart failure 05/16/2011  . Asthma with COPD 09/03/2009  . DIABETES MELLITUS 09/04/2008  . CARDIOMYOPATHY 09/04/2008  . ATRIAL  FIBRILLATION 09/04/2008  . ASTHMATIC BRONCHITIS, ACUTE 09/04/2008    History  Smoking status  . Former Smoker -- 20 years  . Types: Cigarettes  . Quit date: 05/05/1996  Smokeless tobacco  . Never Used    History  Alcohol Use No    Family History  Problem Relation Age of Onset  . Arrhythmia Mother   . Heart failure Mother   . Stroke Mother   . Heart attack Father   . Diabetes Brother   . Heart disease Brother     Review of Systems: The patient denies any heat or cold intolerance.  No weight gain or weight loss.  The patient denies headaches or blurry vision.  There is no cough or sputum production.  The patient denies dizziness.  There is no hematuria or hematochezia.  The patient denies any muscle aches or arthritis.  The patient denies any rash.  The patient denies frequent falling or instability.  There is no history of depression or anxiety.  All other systems were reviewed and are negative.   Physical Exam: Filed Vitals:   10/09/14 0905  BP: 148/82  Pulse: 78   the general appearance is that of a well-developed well-nourished mildly overweight woman in no distress.The head and neck exam reveals pupils equal and reactive.  Extraocular movements are full.  There is no scleral icterus.  The mouth and pharynx are normal.  The neck is supple.  The carotids reveal no bruits.  The jugular venous pressure is normal.  The  thyroid is not enlarged.  There is no lymphadenopathy.  The chest is clear to percussion and auscultation.  There are no rales or rhonchi.  Expansion of the chest is symmetrical.  The precordium is quiet.  The pulse is irregularly irregular.  The first heart sound is normal.  The second heart sound is physiologically split.  There is no murmur gallop rub or click.  There is no abnormal lift or heave.  The abdomen is soft and nontender.  The bowel sounds are normal.  The liver and spleen are not enlarged.  There are no abdominal masses.  There are no abdominal bruits.   Extremities reveal good pedal pulses.  There is no phlebitis or edema.  There is no cyanosis or clubbing.  Strength is normal and symmetrical in all extremities.  There is no lateralizing weakness.  There are no sensory deficits.  The skin is warm and dry.  There is no rash.      Assessment / Plan: 1. permanent atrial fibrillation 2. Hypercholesterolemia 3. essential hypertension without heart failure 4. diabetes mellitus followed by Dr. Buddy Duty 5. asthma followed by Dr. Annamaria Boots 6.  Iron deficiency anemia rule out GI source   Disposition: we are checking blood work today.  We will refer to Dr. Sydell Axon  Brodie or associates for GI consultation. Recheck here in 4 months for office visit CBC lipid panel hepatic function panel and basal metabolic panel

## 2014-10-10 NOTE — Telephone Encounter (Signed)
-----   Message from Darlin Coco, MD sent at 10/09/2014 10:06 PM EST ----- Please report to patient.  The recent labs are stable. Continue same medication and careful diet.

## 2014-10-10 NOTE — Telephone Encounter (Signed)
-----   Message from Darlin Coco, MD sent at 10/09/2014 10:06 PM EST ----- Please report to patient.  The recent labs are stable. Continue same medication and careful diet. Hemoglobin 10.3 not much different from prior years.

## 2014-10-10 NOTE — Telephone Encounter (Signed)
Advised patient of lab results and patient has appointment with GI 10/16/14

## 2014-10-10 NOTE — Telephone Encounter (Signed)
Left message to call back  

## 2014-10-14 ENCOUNTER — Encounter (HOSPITAL_COMMUNITY)
Admission: RE | Admit: 2014-10-14 | Discharge: 2014-10-14 | Disposition: A | Discharge status: Home / Self Care | Payer: Self-pay | Source: Ambulatory Visit | Attending: Internal Medicine | Admitting: Internal Medicine

## 2014-10-15 ENCOUNTER — Other Ambulatory Visit: Payer: Self-pay | Admitting: Cardiology

## 2014-10-16 ENCOUNTER — Encounter: Payer: Self-pay | Admitting: Gastroenterology

## 2014-10-16 ENCOUNTER — Telehealth: Payer: Self-pay | Admitting: Gastroenterology

## 2014-10-16 ENCOUNTER — Ambulatory Visit (INDEPENDENT_AMBULATORY_CARE_PROVIDER_SITE_OTHER): Payer: Medicare Other

## 2014-10-16 ENCOUNTER — Encounter (HOSPITAL_COMMUNITY): Payer: Self-pay

## 2014-10-16 ENCOUNTER — Ambulatory Visit (INDEPENDENT_AMBULATORY_CARE_PROVIDER_SITE_OTHER): Payer: Medicare Other | Admitting: Gastroenterology

## 2014-10-16 VITALS — BP 132/76 | HR 64 | Ht 61.5 in | Wt 198.8 lb

## 2014-10-16 DIAGNOSIS — R899 Unspecified abnormal finding in specimens from other organs, systems and tissues: Secondary | ICD-10-CM

## 2014-10-16 DIAGNOSIS — D509 Iron deficiency anemia, unspecified: Secondary | ICD-10-CM | POA: Insufficient documentation

## 2014-10-16 DIAGNOSIS — Z7901 Long term (current) use of anticoagulants: Secondary | ICD-10-CM

## 2014-10-16 DIAGNOSIS — Z5181 Encounter for therapeutic drug level monitoring: Secondary | ICD-10-CM

## 2014-10-16 DIAGNOSIS — I4891 Unspecified atrial fibrillation: Secondary | ICD-10-CM

## 2014-10-16 LAB — POCT INR: INR: 2.2

## 2014-10-16 MED ORDER — MOVIPREP 100 G PO SOLR: 1.0000 | Freq: Once | ORAL | Status: DC

## 2014-10-16 NOTE — Patient Instructions (Signed)
You have been scheduled for an endoscopy and colonoscopy. Please follow the written instructions given to you at your visit today. Please pick up your prep at the pharmacy within the next 1-3 days. If you use inhalers (even only as needed), please bring them with you on the day of your procedure. Your physician has requested that you go to www.startemmi.com and enter the access code given to you at your visit today. This web site gives a general overview about your procedure. However, you should still follow specific instructions given to you by our office regarding your preparation for the procedure.  You have been given Hemoccult cards, please complete them and mail them back to our office.  You will be contaced by our office prior to your procedure for directions on holding your Coumadin/Warfarin.  If you do not hear from our office 1 week prior to your scheduled procedure, please call [phone] to discuss.

## 2014-10-16 NOTE — Telephone Encounter (Signed)
  10/16/2014   RE: Renee Ray DOB: 14-Jul-1941 MRN: [id number]   Dear Dr. Mare Ferrari,    We have scheduled the above patient for an Endoscopy and Colonoscopy procedure. Our records show that she is on anticoagulation therapy.   Please advise as to how long the patient may come off her therapy of Coumadin 5 days prior to the procedure, which is scheduled for 12/05/14.  Please fax back/ or route the completed form to Tadd Holtmeyer at [phone].   Sincerely,    Glayds Insco A.

## 2014-10-16 NOTE — Progress Notes (Signed)
10/16/2014 Renee Ray [id number] 1940/11/12   HISTORY OF PRESENT ILLNESS:  This is a 73 year old female who is new to our practice.  She is being referred by Dr. Mare Ferrari for iron deficiency anemia.  She has PMH of HTN, HLD, chronic atrial fibrillation on coumadin, insulin dependent diabetes mellitus, and COPD.  She has never undergone colonoscopy in the past.  Recent Hgb was 10.3 grams on labs, but has been low over the past 3 years at least.  Iron studies show low iron at 22, low % sat at 5, normal TIBC at 445.  Ferritin not drawn.  She was recently placed on iron supplements, ferrous sulfate 325 mg daily.  Denies any GI complaints.  Denies seeing blood in her stool.  Stools have never been black, except they are darker now that she is on the iron.  Once again, she is on coumadin for atrial fibrillation for several years and this is prescribed by Dr. Mare Ferrari.  She has not been heme tested.  Just of note, she is very nervous/scared to have these procedures performed due to modesty.   Past Medical History  Diagnosis Date  . Hypertension   . Hyperlipidemia   . Chronic anticoagulation COUMADIN  . Normal nuclear stress test 2008  . Abnormal echocardiogram 2010    showing slightly enlarged left atrium with normal LV function and normal PA pressures.   . Chronic atrial fibrillation CARDIOLOGIST- DR BRACKBILL-- LAST VISIT  12-16-2011 IN [facility]  . Acute medial meniscal injury of knee RIGHT KNEE  . Asthmatic bronchitis , chronic   . OA (osteoarthritis) of knee HANDS  . History of pneumonia associated w/  rhabdomyolysis  OCT 2012  . COPD, moderate PULMOLOGIST- DR YOUNG --  LAST VISIT NOTE 02-23-2012 IN [facility]  . Bruise RIGHT ABD. DUE TO LOVENOX INJECTION  02-27-2012  . Chronic renal insufficiency   . Diabetes mellitus INSULIN AND ORAL MEDS  . Non-ischemic cardiomyopathy normal ef  . Atrial fibrillation    Past Surgical History  Procedure Laterality Date  . Breast enhancement  surgery  1970    1990--- BREAST IMPLANTS REMOVED   . Vaginal hysterectomy  1978  . Knee arthroscopy  03/02/2012    Procedure: ARTHROSCOPY KNEE;  Surgeon: Gearlean Alf, MD;  Location: [facility];  Service: Orthopedics;  Laterality: Right;  WITH DEBRIDEMENT of medial menisces    reports that she quit smoking about 18 years ago. Her smoking use included Cigarettes. She smoked 0.00 packs per day for 20 years. She has never used smokeless tobacco. She reports that she does not drink alcohol or use illicit drugs. family history includes Arrhythmia in her mother; Diabetes in her brother; Heart attack in her father; Heart disease in her brother; Heart failure in her mother; Stroke in her mother. Allergies  Allergen Reactions  . Janumet [Sitagliptin-Metformin Hcl] Swelling  . Amoxicillin Diarrhea  . Sulfonamide Derivatives Other (See Comments)    REACTION: thrush      Outpatient Encounter Prescriptions as of 10/16/2014  Medication Sig  . aspirin 81 MG tablet Take 81 mg by mouth daily.   [name] b complex vitamins capsule Take 1 capsule by mouth daily.   . calcium carbonate (OS-CAL) 600 MG TABS Take 600 mg by mouth daily.   . cholecalciferol (VITAMIN D) 1000 UNITS tablet Take 1,000 Units by mouth daily.   . ferrous sulfate 325 (65 FE) MG tablet Take 325 mg by mouth daily with breakfast.  .  furosemide (LASIX) 40 MG tablet TAKE 1 TABLET BY MOUTH ONCE DAILY  . glucose blood test strip as directed.  . insulin glargine (LANTUS) 100 UNIT/ML injection Inject 40 Units into the skin at bedtime.   [name] KLOR-CON M20 20 MEQ tablet TAKE 1 TABLET BY MOUTH ONCE DAILY  . losartan-hydrochlorothiazide (HYZAAR) 100-25 MG per tablet TAKE 1 TABLET BY MOUTH DAILY.  . metFORMIN (GLUCOPHAGE) 1000 MG tablet Take 1000mg  every morning and 500mg  every evening  . Multiple Vitamin (MULTIVITAMIN) tablet Take 1 tablet by mouth daily.   . Omega-3 Fatty Acids (FISH OIL PO) Take 10 mLs by mouth daily.  [name] PROAIR HFA 108  (90 BASE) MCG/ACT inhaler INHALE 2 PUFFS EVERY 4 HOURS AS NEEDED.  [name] rosuvastatin (CRESTOR) 5 MG tablet Take 1 tablet (5 mg total) by mouth daily.  . sitaGLIPtin (JANUVIA) 50 MG tablet Take 50 mg by mouth daily.  [name] SPIRIVA HANDIHALER 18 MCG inhalation capsule INHALE 1 CAPSULE VIA HANDIHALER EVERY MORNING  . warfarin (COUMADIN) 5 MG tablet 1 tablet daily except 2 tablets on Mondays. Or as directed  . [DISCONTINUED] predniSONE (STERAPRED UNI-PAK) 5 MG TABS tablet 1 or 2 daily as directed (Patient not taking: Reported on 10/16/2014)     REVIEW OF SYSTEMS  : All other systems reviewed and negative except where noted in the History of Present Illness.   PHYSICAL EXAM: BP 132/76 mmHg  Pulse 64  Ht 5' 1.5" (1.562 m)  Wt 198 lb 12.8 oz (90.175 kg)  BMI 36.96 kg/m2 General: Well developed white female in no acute distress Head: Normocephalic and atraumatic Eyes:  Sclerae anicteric, conjunctiva pink. Ears: Normal auditory acuity Lungs: Clear throughout to auscultation Heart:  Irregularly irregular. Abdomen: Soft, non-distended.  Normal bowel sounds.  Non-tender.   Rectal:  Will be done at the time of colonoscopy. Musculoskeletal: Symmetrical with no gross deformities  Skin: No lesions on visible extremities Extremities: No edema  Neurological: Alert oriented x 4, grossly non-focal Psychological:  Alert and cooperative. Normal mood and affect  ASSESSMENT AND PLAN: -Iron deficiency anemia:  Never had colonoscopy.  Hgb currently 10.3 grams and has been chronically low since at least 3 years ago.  Iron studies low as well and recently placed on iron supplements.  Will schedule EGD and colonoscopy with Dr. Olevia Perches (patient's request).  Will also send her with 3 hemoccult cards to perform and return to [place].  Continue iron supplements as directed by cardiology in the interim.  The risks, benefits, and alternatives were discussed with the patient and she consents to proceed.  The risks benefits and  alternatives to a temporary hold of anti-coagulants/anti-platelets for the procedure were discussed with the patient she consents to proceed. Obtain clearance from Dr. Mare Ferrari for ok to hold coumadin. -Atrial fibrillation on chronic coumadin

## 2014-10-16 NOTE — Progress Notes (Signed)
Reviewed and agree.This pt probably should not wait more than 2-3 weeks  For her procedure, so she may need to be done at [facility]. On my day off.

## 2014-10-19 NOTE — Telephone Encounter (Signed)
We can stop her coumadin 5 days ahead and have coumadin clinic arrange for lovenox bridging.

## 2014-10-20 NOTE — Telephone Encounter (Signed)
Sophia,   Ms. Shores called back. I advised Ms. Sermeno that she will need a Lovenox Bridging and Dr. Mare Ferrari will be contacting her. Patient stated that she will their office herself.

## 2014-10-20 NOTE — Telephone Encounter (Signed)
Spoke with patient and will forward to anti-coag clinic

## 2014-10-20 NOTE — Telephone Encounter (Signed)
reviewed

## 2014-10-20 NOTE — Telephone Encounter (Signed)
Called the patient and left a message on voicemail for her to return my call regarding Coumadin.

## 2014-10-21 ENCOUNTER — Encounter (HOSPITAL_COMMUNITY)
Admission: RE | Admit: 2014-10-21 | Discharge: 2014-10-21 | Disposition: A | Discharge status: Home / Self Care | Payer: Self-pay | Source: Ambulatory Visit | Attending: Internal Medicine | Admitting: Internal Medicine

## 2014-10-22 ENCOUNTER — Other Ambulatory Visit (HOSPITAL_COMMUNITY): Payer: Self-pay | Admitting: Internal Medicine

## 2014-10-22 DIAGNOSIS — R7989 Other specified abnormal findings of blood chemistry: Secondary | ICD-10-CM

## 2014-10-23 ENCOUNTER — Encounter (HOSPITAL_COMMUNITY): Payer: Self-pay

## 2014-10-28 ENCOUNTER — Encounter (HOSPITAL_COMMUNITY)
Admission: RE | Admit: 2014-10-28 | Discharge: 2014-10-28 | Disposition: A | Discharge status: Home / Self Care | Payer: Self-pay | Source: Ambulatory Visit | Attending: Internal Medicine | Admitting: Internal Medicine

## 2014-10-30 ENCOUNTER — Encounter (HOSPITAL_COMMUNITY): Payer: Self-pay

## 2014-10-30 ENCOUNTER — Encounter: Payer: Self-pay | Admitting: Internal Medicine

## 2014-11-04 ENCOUNTER — Encounter (HOSPITAL_COMMUNITY)
Admission: RE | Admit: 2014-11-04 | Discharge: 2014-11-04 | Disposition: A | Discharge status: Home / Self Care | Payer: Self-pay | Source: Ambulatory Visit | Attending: Internal Medicine | Admitting: Internal Medicine

## 2014-11-05 ENCOUNTER — Encounter (HOSPITAL_COMMUNITY)
Admission: RE | Admit: 2014-11-05 | Discharge: 2014-11-05 | Disposition: A | Discharge status: Home / Self Care | Payer: Medicare Other | Source: Ambulatory Visit | Attending: Internal Medicine | Admitting: Internal Medicine

## 2014-11-05 DIAGNOSIS — R946 Abnormal results of thyroid function studies: Secondary | ICD-10-CM | POA: Insufficient documentation

## 2014-11-05 DIAGNOSIS — R7989 Other specified abnormal findings of blood chemistry: Secondary | ICD-10-CM

## 2014-11-06 ENCOUNTER — Encounter (HOSPITAL_COMMUNITY): Payer: Self-pay

## 2014-11-06 ENCOUNTER — Encounter (HOSPITAL_COMMUNITY)
Admission: RE | Admit: 2014-11-06 | Discharge: 2014-11-06 | Disposition: A | Discharge status: Home / Self Care | Payer: Medicare Other | Source: Ambulatory Visit | Attending: Internal Medicine | Admitting: Internal Medicine

## 2014-11-06 ENCOUNTER — Other Ambulatory Visit: Payer: Self-pay | Admitting: Internal Medicine

## 2014-11-06 DIAGNOSIS — R946 Abnormal results of thyroid function studies: Secondary | ICD-10-CM | POA: Diagnosis not present

## 2014-11-06 MED ORDER — SODIUM PERTECHNETATE TC 99M INJECTION
10.0000 | Freq: Once | INTRAVENOUS | Status: AC | PRN
Start: 2014-11-06 — End: 2014-11-06
  Administered 2014-11-06: 10 via INTRAVENOUS

## 2014-11-06 MED ORDER — SODIUM IODIDE I 131 CAPSULE
13.7000 | Freq: Once | INTRAVENOUS | Status: AC | PRN
  Administered 2014-11-06: 13.7 via ORAL

## 2014-11-11 ENCOUNTER — Other Ambulatory Visit: Payer: Self-pay | Admitting: Cardiology

## 2014-11-11 ENCOUNTER — Encounter (HOSPITAL_COMMUNITY): Payer: Medicare Other

## 2014-11-11 DIAGNOSIS — I429 Cardiomyopathy, unspecified: Secondary | ICD-10-CM | POA: Insufficient documentation

## 2014-11-11 DIAGNOSIS — Z5189 Encounter for other specified aftercare: Secondary | ICD-10-CM | POA: Diagnosis not present

## 2014-11-11 DIAGNOSIS — E119 Type 2 diabetes mellitus without complications: Secondary | ICD-10-CM | POA: Insufficient documentation

## 2014-11-11 DIAGNOSIS — J449 Chronic obstructive pulmonary disease, unspecified: Secondary | ICD-10-CM | POA: Insufficient documentation

## 2014-11-11 DIAGNOSIS — E785 Hyperlipidemia, unspecified: Secondary | ICD-10-CM | POA: Diagnosis not present

## 2014-11-11 DIAGNOSIS — I119 Hypertensive heart disease without heart failure: Secondary | ICD-10-CM | POA: Insufficient documentation

## 2014-11-11 DIAGNOSIS — I4891 Unspecified atrial fibrillation: Secondary | ICD-10-CM | POA: Diagnosis not present

## 2014-11-11 DIAGNOSIS — J45909 Unspecified asthma, uncomplicated: Secondary | ICD-10-CM | POA: Diagnosis not present

## 2014-11-13 ENCOUNTER — Encounter (HOSPITAL_COMMUNITY)
Admission: RE | Admit: 2014-11-13 | Discharge: 2014-11-13 | Disposition: A | Discharge status: Home / Self Care | Payer: Medicare Other | Source: Ambulatory Visit | Attending: Internal Medicine | Admitting: Internal Medicine

## 2014-11-13 ENCOUNTER — Ambulatory Visit (INDEPENDENT_AMBULATORY_CARE_PROVIDER_SITE_OTHER): Payer: Medicare Other | Admitting: Pharmacist

## 2014-11-13 ENCOUNTER — Other Ambulatory Visit: Payer: Self-pay | Admitting: Internal Medicine

## 2014-11-13 VITALS — Wt 197.8 lb

## 2014-11-13 DIAGNOSIS — I4891 Unspecified atrial fibrillation: Secondary | ICD-10-CM

## 2014-11-13 DIAGNOSIS — Z5181 Encounter for therapeutic drug level monitoring: Secondary | ICD-10-CM

## 2014-11-13 DIAGNOSIS — E0590 Thyrotoxicosis, unspecified without thyrotoxic crisis or storm: Secondary | ICD-10-CM

## 2014-11-13 DIAGNOSIS — R899 Unspecified abnormal finding in specimens from other organs, systems and tissues: Secondary | ICD-10-CM | POA: Diagnosis not present

## 2014-11-13 LAB — POCT INR: INR: 2.1

## 2014-11-13 MED ORDER — ENOXAPARIN SODIUM 100 MG/ML ~~LOC~~ SOLN: 100.0000 mg | Freq: Two times a day (BID) | SUBCUTANEOUS | Status: DC

## 2014-11-13 NOTE — Patient Instructions (Addendum)
11/30/14 No Lovenox or Coumadin 12/01/14 Start Lovenox 100mg  at 9am and 9pm 12/02/14 Lovenox 100mg  at 9am and 9pm 12/03/14 Lovenox 100mg  at 9am and 9pm 12/04/14 Lovenox 100mg  at 9am and 9pm 12/05/14 Day of surgery, No Lovenox.  After procedure restart Coumadin and Lovenox per Physician instructions.  Take Coumadin 1.5 tablets after procedure if approved by physician.  12/06/14 Lovenox 100mg  at 9am and  9pm  & Coumadin 1.5 tablets 12/07/14 Lovenox 100mg  at 9am and 9pm  12/08/14 Lovenox 100mg  at 9am and 9am & Coumadin 1/2 tablet 12/09/14 Coumadin Clinic Appointment  Continue Lovenox injections until followed up in the Coumadin Clinic on 12/09/14.  Call with any questions [phone].

## 2014-11-18 ENCOUNTER — Encounter (HOSPITAL_COMMUNITY)
Admission: RE | Admit: 2014-11-18 | Discharge: 2014-11-18 | Disposition: A | Discharge status: Home / Self Care | Payer: Medicare Other | Source: Ambulatory Visit | Attending: Internal Medicine | Admitting: Internal Medicine

## 2014-11-20 ENCOUNTER — Encounter (HOSPITAL_COMMUNITY): Payer: Medicare Other

## 2014-11-21 ENCOUNTER — Other Ambulatory Visit (INDEPENDENT_AMBULATORY_CARE_PROVIDER_SITE_OTHER): Payer: Medicare Other

## 2014-11-21 ENCOUNTER — Other Ambulatory Visit: Payer: Self-pay | Admitting: *Deleted

## 2014-11-21 DIAGNOSIS — K921 Melena: Secondary | ICD-10-CM | POA: Diagnosis not present

## 2014-11-21 LAB — HEMOCCULT SLIDES (X 3 CARDS)
Fecal Occult Blood: NEGATIVE
OCCULT 1: NEGATIVE
OCCULT 2: NEGATIVE
OCCULT 3: NEGATIVE
OCCULT 4: NEGATIVE
OCCULT 5: NEGATIVE

## 2014-11-25 ENCOUNTER — Encounter (HOSPITAL_COMMUNITY): Payer: Medicare Other

## 2014-11-27 ENCOUNTER — Encounter (HOSPITAL_COMMUNITY): Payer: Medicare Other

## 2014-12-01 ENCOUNTER — Telehealth: Payer: Self-pay | Admitting: Gastroenterology

## 2014-12-01 NOTE — Telephone Encounter (Signed)
Spoke with Alonza Bogus PA and per Janett Billow pt is fine for EGD, pt aware.

## 2014-12-02 ENCOUNTER — Encounter (HOSPITAL_COMMUNITY): Payer: Medicare Other

## 2014-12-03 ENCOUNTER — Other Ambulatory Visit: Payer: Self-pay | Admitting: Cardiology

## 2014-12-04 ENCOUNTER — Encounter (HOSPITAL_COMMUNITY): Payer: Medicare Other

## 2014-12-05 ENCOUNTER — Encounter: Payer: Self-pay | Admitting: Internal Medicine

## 2014-12-05 ENCOUNTER — Ambulatory Visit (AMBULATORY_SURGERY_CENTER): Payer: Medicare Other | Admitting: Internal Medicine

## 2014-12-05 VITALS — BP 139/65 | HR 94 | Temp 97.1°F | Resp 14

## 2014-12-05 DIAGNOSIS — K317 Polyp of stomach and duodenum: Secondary | ICD-10-CM | POA: Diagnosis not present

## 2014-12-05 DIAGNOSIS — K2270 Barrett's esophagus without dysplasia: Secondary | ICD-10-CM | POA: Diagnosis not present

## 2014-12-05 DIAGNOSIS — D509 Iron deficiency anemia, unspecified: Secondary | ICD-10-CM | POA: Diagnosis not present

## 2014-12-05 LAB — GLUCOSE, CAPILLARY
Glucose-Capillary: 128 mg/dL — ABNORMAL HIGH (ref 70–99)
Glucose-Capillary: 106 mg/dL — ABNORMAL HIGH (ref 70–99)

## 2014-12-05 MED ORDER — SODIUM CHLORIDE 0.9 % IV SOLN: 500.0000 mL | INTRAVENOUS | Status: DC

## 2014-12-05 NOTE — Op Note (Signed)
[place]  [address]. [place], [id number]   ENDOSCOPY PROCEDURE REPORT  PATIENT: Renee, Ray  MR#: [id number] BIRTHDATE: [birth year] , 73  yrs. old GENDER: female ENDOSCOPIST: Lafayette Dragon, MD REFERRED BY:  [name], M.D, Dr Mare Ferrari. PROCEDURE DATE:  12/05/2014 PROCEDURE:  EGD w/ biopsy ASA CLASS:     Class III INDICATIONS:  heme negative anemia.  Iron deficiency, hemoglobin 10.3.  5% iron saturation.  Patient has been on Coumadin for atrial fibrillation.  It has been on hold for 5 days. MEDICATIONS: Monitored anesthesia care and Propofol 150 mg IV TOPICAL ANESTHETIC: none  DESCRIPTION OF PROCEDURE: After the risks benefits and alternatives of the procedure were thoroughly explained, informed consent was obtained.  The LB [id number] endoscope was introduced through the mouth and advanced to the second portion of the duodenum , Without limitations.  The instrument was slowly withdrawn as the mucosa was fully examined.    Esophagus:  proximal, mid and distal esophageal mucosa was normal. Squamocolumnar junction was normal, there  was no hiatal hernia Stomach: endoscope traversed into the stomach without resistance. There was: [place] Slider of gastric polyps in the proximal stomach consistent with fundic gland polyps. Biopsies were obtained. The rest of the stomach appeared normal with normal gastric antrum and pyloric outlet Duodenum: duodenal bulb and descending duodenum was unremarkable. Biopsies were obtained from second portion of duodenum to rule out villous atrophy[          The scope was then withdrawn from the patient and the procedure completed.  COMPLICATIONS: There were no immediate complications.  ENDOSCOPIC IMPRESSION:  multiple gastric polyps in proximal stomach. Status post biopsies. No evidence of bleeding 2. Biopsies from second portion duodenum to rule out malabsorption  RECOMMENDATIONS: 1.  Await pathology  results 2.  Proceed with colonoscopy  REPEAT EXAM: for EGD pending biopsy results.  eSigned:  Lafayette Dragon, MD 12/05/2014 10:45 AM    CC:  PATIENT NAME:  Renee, Ray MR#: [id number]

## 2014-12-05 NOTE — Progress Notes (Signed)
Pt self administered albuterol by mdi prior to egd.

## 2014-12-05 NOTE — Patient Instructions (Addendum)
RESUME YOUR COUMADIN. CONTINUE YOUR LOVENOX AS INSTRUCTED. FOLLOW UP WITH  BLOOD CHECK AS ORDERED. RESUME YOUR IRON SUPPLEMENT.    YOU HAD AN ENDOSCOPIC PROCEDURE TODAY AT [facility]: Refer to the procedure report that was given to you for any specific questions about what was found during the examination.  If the procedure report does not answer your questions, please call your gastroenterologist to clarify.  If you requested that your care partner not be given the details of your procedure findings, then the procedure report has been included in a sealed envelope for you to review at your convenience later.  YOU SHOULD EXPECT: Some feelings of bloating in the abdomen. Passage of more gas than usual.  Walking can help get rid of the air that was put into your GI tract during the procedure and reduce the bloating. If you had a lower endoscopy (such as a colonoscopy or flexible sigmoidoscopy) you may notice spotting of blood in your stool or on the toilet paper. If you underwent a bowel prep for your procedure, then you may not have a normal bowel movement for a few days.  DIET: Your first meal following the procedure should be a light meal and then it is ok to progress to your normal diet.  A half-sandwich or bowl of soup is an example of a good first meal.  Heavy or fried foods are harder to digest and may make you feel nauseous or bloated.  Likewise meals heavy in dairy and vegetables can cause extra gas to form and this can also increase the bloating.  Drink plenty of fluids but you should avoid alcoholic beverages for 24 hours.  ACTIVITY: Your care partner should take you home directly after the procedure.  You should plan to take it easy, moving slowly for the rest of the day.  You can resume normal activity the day after the procedure however you should NOT DRIVE or use heavy machinery for 24 hours (because of the sedation medicines used during the test).    SYMPTOMS TO REPORT  IMMEDIATELY: A gastroenterologist can be reached at any hour.  During normal business hours, 8:30 AM to 5:00 PM Monday through Friday, call [phone].  After hours and on weekends, please call the GI answering service at [phone] who will take a message and have the physician on call contact you.   Following lower endoscopy (colonoscopy or flexible sigmoidoscopy):  Excessive amounts of blood in the stool  Significant tenderness or worsening of abdominal pains  Swelling of the abdomen that is new, acute  Fever of 100F or higher  Following upper endoscopy (EGD)  Vomiting of blood or coffee ground material  New chest pain or pain under the shoulder blades  Painful or persistently difficult swallowing  New shortness of breath  Fever of 100F or higher  Black, tarry-looking stools  FOLLOW UP: If any biopsies were taken you will be contacted by phone or by letter within the next 1-3 weeks.  Call your gastroenterologist if you have not heard about the biopsies in 3 weeks.  Our staff will call the home number listed on your records the next business day following your procedure to check on you and address any questions or concerns that you may have at that time regarding the information given to you following your procedure. This is a courtesy call and so if there is no answer at the home number and we have not heard from you through the emergency  physician on call, we will assume that you have returned to your regular daily activities without incident.  SIGNATURES/CONFIDENTIALITY: You and/or your care partner have signed paperwork which will be entered into your electronic medical record.  These signatures attest to the fact that that the information above on your After Visit Summary has been reviewed and is understood.  Full responsibility of the confidentiality of this discharge information lies with you and/or your care-partner.

## 2014-12-05 NOTE — Progress Notes (Signed)
Called to room to assist during endoscopic procedure.  Patient ID and intended procedure confirmed with present staff. Received instructions for my participation in the procedure from the performing physician.  

## 2014-12-05 NOTE — Op Note (Signed)
[place]  [address]. [place], [id number]   COLONOSCOPY PROCEDURE REPORT  PATIENT: Renee, Ray  MR#: [id number] BIRTHDATE: Dec 12, 1940 , 73  yrs. old GENDER: female ENDOSCOPIST: Lafayette Dragon, MD REFERRED DX:[id number] Carlota Raspberry, M.D. , Dr Warren Danes PROCEDURE DATE:  12/05/2014 PROCEDURE:   Colonoscopy, screening First Screening Colonoscopy - Avg.  risk and is 50 yrs.  old or older Yes.  Prior Negative Screening - Now for repeat screening. N/A  History of Adenoma - Now for follow-up colonoscopy & has been > or = to 3 yrs.  N/A  Polyps Removed Today? No.  Polyps Removed Today? No.  Recommend repeat exam, <10 yrs? Polyps Removed Today? No.  Recommend repeat exam, <10 yrs? No. ASA CLASS:   Class III INDICATIONS:hheme-negative iron deficiency anemia.  Negative upper endoscopy.  Patient has been on Coumadin for atrial fibrillation. Coumadin has been on hold for 5 days. MEDICATIONS: Monitored anesthesia care and Propofol 100 mg IV  DESCRIPTION OF PROCEDURE:   After the risks benefits and alternatives of the procedure were thoroughly explained, informed consent was obtained.  The digital rectal exam revealed no abnormalities of the rectum.   The LB [id number]  endoscope was introduced through the anus and advanced to the cecum, which was identified by both the appendix and ileocecal valve. No adverse events experienced.   The quality of the prep was good, using MoviPrep  The instrument was then slowly withdrawn as the colon was fully examined.      COLON FINDINGS: Small internal Grade I hemorrhoids were found. Retroflexed views revealed no abnormalities. The time to cecum=4 minutes 37 seconds.  Withdrawal time=6 minutes 01 seconds.  The scope was withdrawn and the procedure completed. COMPLICATIONS: There were no immediate complications.  ENDOSCOPIC IMPRESSION: Small internal Grade I hemorrhoids nothing to account for iron deficiency  anemia  RECOMMENDATIONS:  resume Coumadin Resume iron supplements If anemia does not respond to oral iron supplements then we would consider small bowel capsule endoscopy to look for source of GI blood loss from the small bowel no recall colonoscopy due to age eSigned:  Lafayette Dragon, MD 12/05/2014 10:50 AM   cc:   PATIENT NAME:  Renee, Ray MR#: [id number]

## 2014-12-05 NOTE — Progress Notes (Signed)
Procedure ends, to recovery, repport given and VSS.

## 2014-12-08 ENCOUNTER — Telehealth: Payer: Self-pay | Admitting: *Deleted

## 2014-12-08 NOTE — Telephone Encounter (Signed)
  Follow up Call-  Call back number 12/05/2014  Post procedure Call Back phone  # [phone]  Permission to leave phone message Yes     Patient questions:  Do you have a fever, pain , or abdominal swelling? No. Pain Score  0 *  Have you tolerated food without any problems? Yes.    Have you been able to return to your normal activities? Yes.    Do you have any questions about your discharge instructions: Diet   No. Medications  No. Follow up visit  No.  Do you have questions or concerns about your Care? No.  Actions: * If pain score is 4 or above: No action needed, pain <4.

## 2014-12-09 ENCOUNTER — Encounter (HOSPITAL_COMMUNITY)
Admission: RE | Admit: 2014-12-09 | Discharge: 2014-12-09 | Disposition: A | Discharge status: Home / Self Care | Payer: Medicare Other | Source: Ambulatory Visit | Attending: Internal Medicine | Admitting: Internal Medicine

## 2014-12-09 ENCOUNTER — Ambulatory Visit (INDEPENDENT_AMBULATORY_CARE_PROVIDER_SITE_OTHER): Payer: Medicare Other | Admitting: Pharmacist Clinician (PhC)/ Clinical Pharmacy Specialist

## 2014-12-09 ENCOUNTER — Other Ambulatory Visit: Payer: Self-pay | Admitting: Pharmacist Clinician (PhC)/ Clinical Pharmacy Specialist

## 2014-12-09 DIAGNOSIS — E785 Hyperlipidemia, unspecified: Secondary | ICD-10-CM | POA: Insufficient documentation

## 2014-12-09 DIAGNOSIS — R899 Unspecified abnormal finding in specimens from other organs, systems and tissues: Secondary | ICD-10-CM | POA: Diagnosis not present

## 2014-12-09 DIAGNOSIS — I429 Cardiomyopathy, unspecified: Secondary | ICD-10-CM | POA: Insufficient documentation

## 2014-12-09 DIAGNOSIS — I4891 Unspecified atrial fibrillation: Secondary | ICD-10-CM | POA: Diagnosis not present

## 2014-12-09 DIAGNOSIS — E119 Type 2 diabetes mellitus without complications: Secondary | ICD-10-CM | POA: Insufficient documentation

## 2014-12-09 DIAGNOSIS — Z5189 Encounter for other specified aftercare: Secondary | ICD-10-CM | POA: Insufficient documentation

## 2014-12-09 DIAGNOSIS — Z5181 Encounter for therapeutic drug level monitoring: Secondary | ICD-10-CM | POA: Diagnosis not present

## 2014-12-09 DIAGNOSIS — J449 Chronic obstructive pulmonary disease, unspecified: Secondary | ICD-10-CM | POA: Insufficient documentation

## 2014-12-09 DIAGNOSIS — I119 Hypertensive heart disease without heart failure: Secondary | ICD-10-CM | POA: Insufficient documentation

## 2014-12-09 DIAGNOSIS — J45909 Unspecified asthma, uncomplicated: Secondary | ICD-10-CM | POA: Insufficient documentation

## 2014-12-09 LAB — POCT INR: INR: 1.1

## 2014-12-09 MED ORDER — ENOXAPARIN SODIUM 100 MG/ML ~~LOC~~ SOLN: 100.0000 mg | Freq: Two times a day (BID) | SUBCUTANEOUS | Status: DC

## 2014-12-10 ENCOUNTER — Encounter: Payer: Self-pay | Admitting: Internal Medicine

## 2014-12-11 ENCOUNTER — Encounter (HOSPITAL_COMMUNITY)
Admission: RE | Admit: 2014-12-11 | Discharge: 2014-12-11 | Disposition: A | Discharge status: Home / Self Care | Payer: Medicare Other | Source: Ambulatory Visit | Attending: Internal Medicine | Admitting: Internal Medicine

## 2014-12-11 ENCOUNTER — Ambulatory Visit (INDEPENDENT_AMBULATORY_CARE_PROVIDER_SITE_OTHER): Payer: Medicare Other | Admitting: *Deleted

## 2014-12-11 DIAGNOSIS — R899 Unspecified abnormal finding in specimens from other organs, systems and tissues: Secondary | ICD-10-CM

## 2014-12-11 DIAGNOSIS — J45909 Unspecified asthma, uncomplicated: Secondary | ICD-10-CM | POA: Diagnosis not present

## 2014-12-11 DIAGNOSIS — I119 Hypertensive heart disease without heart failure: Secondary | ICD-10-CM | POA: Diagnosis not present

## 2014-12-11 DIAGNOSIS — Z5181 Encounter for therapeutic drug level monitoring: Secondary | ICD-10-CM

## 2014-12-11 DIAGNOSIS — E119 Type 2 diabetes mellitus without complications: Secondary | ICD-10-CM | POA: Diagnosis not present

## 2014-12-11 DIAGNOSIS — I4891 Unspecified atrial fibrillation: Secondary | ICD-10-CM

## 2014-12-11 DIAGNOSIS — E785 Hyperlipidemia, unspecified: Secondary | ICD-10-CM | POA: Diagnosis not present

## 2014-12-11 DIAGNOSIS — I429 Cardiomyopathy, unspecified: Secondary | ICD-10-CM | POA: Diagnosis not present

## 2014-12-11 DIAGNOSIS — Z5189 Encounter for other specified aftercare: Secondary | ICD-10-CM | POA: Diagnosis not present

## 2014-12-11 DIAGNOSIS — J449 Chronic obstructive pulmonary disease, unspecified: Secondary | ICD-10-CM | POA: Diagnosis not present

## 2014-12-11 LAB — POCT INR: INR: 1.4

## 2014-12-11 MED ORDER — ENOXAPARIN SODIUM 100 MG/ML ~~LOC~~ SOLN: 100.0000 mg | Freq: Two times a day (BID) | SUBCUTANEOUS | Status: DC

## 2014-12-15 ENCOUNTER — Ambulatory Visit (INDEPENDENT_AMBULATORY_CARE_PROVIDER_SITE_OTHER): Payer: Medicare Other | Admitting: *Deleted

## 2014-12-15 DIAGNOSIS — I4891 Unspecified atrial fibrillation: Secondary | ICD-10-CM

## 2014-12-15 DIAGNOSIS — R899 Unspecified abnormal finding in specimens from other organs, systems and tissues: Secondary | ICD-10-CM | POA: Diagnosis not present

## 2014-12-15 DIAGNOSIS — Z5181 Encounter for therapeutic drug level monitoring: Secondary | ICD-10-CM

## 2014-12-15 LAB — POCT INR: INR: 2.2

## 2014-12-16 ENCOUNTER — Encounter (HOSPITAL_COMMUNITY): Payer: Medicare Other

## 2014-12-18 ENCOUNTER — Encounter (HOSPITAL_COMMUNITY)
Admission: RE | Admit: 2014-12-18 | Discharge: 2014-12-18 | Disposition: A | Discharge status: Home / Self Care | Payer: Medicare Other | Source: Ambulatory Visit | Attending: Internal Medicine | Admitting: Internal Medicine

## 2014-12-23 ENCOUNTER — Encounter (HOSPITAL_COMMUNITY)
Admission: RE | Admit: 2014-12-23 | Discharge: 2014-12-23 | Disposition: A | Discharge status: Home / Self Care | Payer: Medicare Other | Source: Ambulatory Visit | Attending: Internal Medicine | Admitting: Internal Medicine

## 2014-12-25 ENCOUNTER — Encounter (HOSPITAL_COMMUNITY): Admission: RE | Admit: 2014-12-25 | Payer: Medicare Other | Source: Ambulatory Visit

## 2014-12-25 ENCOUNTER — Encounter (HOSPITAL_COMMUNITY): Payer: Medicare Other

## 2014-12-29 ENCOUNTER — Encounter (HOSPITAL_COMMUNITY)
Admission: RE | Admit: 2014-12-29 | Discharge: 2014-12-29 | Disposition: A | Discharge status: Home / Self Care | Payer: Medicare Other | Source: Ambulatory Visit | Attending: Internal Medicine | Admitting: Internal Medicine

## 2014-12-29 DIAGNOSIS — E0590 Thyrotoxicosis, unspecified without thyrotoxic crisis or storm: Secondary | ICD-10-CM | POA: Insufficient documentation

## 2014-12-29 MED ORDER — SODIUM IODIDE I 131 CAPSULE
31.0000 | Freq: Once | INTRAVENOUS | Status: AC | PRN
  Administered 2014-12-29: 31 via ORAL

## 2014-12-30 ENCOUNTER — Encounter (HOSPITAL_COMMUNITY): Payer: Medicare Other

## 2014-12-30 ENCOUNTER — Other Ambulatory Visit: Payer: Self-pay | Admitting: Cardiology

## 2015-01-01 ENCOUNTER — Encounter (HOSPITAL_COMMUNITY): Payer: Medicare Other

## 2015-01-05 ENCOUNTER — Ambulatory Visit (INDEPENDENT_AMBULATORY_CARE_PROVIDER_SITE_OTHER): Payer: Medicare Other | Admitting: *Deleted

## 2015-01-05 DIAGNOSIS — I4891 Unspecified atrial fibrillation: Secondary | ICD-10-CM

## 2015-01-05 DIAGNOSIS — R899 Unspecified abnormal finding in specimens from other organs, systems and tissues: Secondary | ICD-10-CM

## 2015-01-05 DIAGNOSIS — Z5181 Encounter for therapeutic drug level monitoring: Secondary | ICD-10-CM

## 2015-01-05 LAB — POCT INR: INR: 1.2

## 2015-01-06 ENCOUNTER — Encounter (HOSPITAL_COMMUNITY): Payer: Self-pay

## 2015-01-06 DIAGNOSIS — E785 Hyperlipidemia, unspecified: Secondary | ICD-10-CM | POA: Insufficient documentation

## 2015-01-06 DIAGNOSIS — I119 Hypertensive heart disease without heart failure: Secondary | ICD-10-CM | POA: Insufficient documentation

## 2015-01-06 DIAGNOSIS — Z5189 Encounter for other specified aftercare: Secondary | ICD-10-CM | POA: Diagnosis not present

## 2015-01-06 DIAGNOSIS — E119 Type 2 diabetes mellitus without complications: Secondary | ICD-10-CM | POA: Insufficient documentation

## 2015-01-06 DIAGNOSIS — I4891 Unspecified atrial fibrillation: Secondary | ICD-10-CM | POA: Diagnosis not present

## 2015-01-06 DIAGNOSIS — J45909 Unspecified asthma, uncomplicated: Secondary | ICD-10-CM | POA: Diagnosis not present

## 2015-01-06 DIAGNOSIS — J449 Chronic obstructive pulmonary disease, unspecified: Secondary | ICD-10-CM | POA: Insufficient documentation

## 2015-01-06 DIAGNOSIS — I429 Cardiomyopathy, unspecified: Secondary | ICD-10-CM | POA: Insufficient documentation

## 2015-01-08 ENCOUNTER — Encounter (HOSPITAL_COMMUNITY)
Admission: RE | Admit: 2015-01-08 | Discharge: 2015-01-08 | Disposition: A | Discharge status: Home / Self Care | Payer: Self-pay | Source: Ambulatory Visit | Attending: Internal Medicine | Admitting: Internal Medicine

## 2015-01-09 ENCOUNTER — Ambulatory Visit (INDEPENDENT_AMBULATORY_CARE_PROVIDER_SITE_OTHER): Payer: Medicare Other

## 2015-01-09 DIAGNOSIS — I4891 Unspecified atrial fibrillation: Secondary | ICD-10-CM | POA: Diagnosis not present

## 2015-01-09 DIAGNOSIS — Z5181 Encounter for therapeutic drug level monitoring: Secondary | ICD-10-CM | POA: Diagnosis not present

## 2015-01-09 DIAGNOSIS — R899 Unspecified abnormal finding in specimens from other organs, systems and tissues: Secondary | ICD-10-CM

## 2015-01-09 LAB — POCT INR: INR: 1.8

## 2015-01-13 ENCOUNTER — Encounter (HOSPITAL_COMMUNITY)
Admission: RE | Admit: 2015-01-13 | Discharge: 2015-01-13 | Disposition: A | Discharge status: Home / Self Care | Payer: Self-pay | Source: Ambulatory Visit | Attending: Internal Medicine | Admitting: Internal Medicine

## 2015-01-15 ENCOUNTER — Encounter (HOSPITAL_COMMUNITY)
Admission: RE | Admit: 2015-01-15 | Discharge: 2015-01-15 | Disposition: A | Discharge status: Home / Self Care | Payer: Self-pay | Source: Ambulatory Visit | Attending: Internal Medicine | Admitting: Internal Medicine

## 2015-01-22 ENCOUNTER — Ambulatory Visit (INDEPENDENT_AMBULATORY_CARE_PROVIDER_SITE_OTHER): Payer: Medicare Other

## 2015-01-22 DIAGNOSIS — E0590 Thyrotoxicosis, unspecified without thyrotoxic crisis or storm: Secondary | ICD-10-CM | POA: Diagnosis not present

## 2015-01-22 DIAGNOSIS — I4891 Unspecified atrial fibrillation: Secondary | ICD-10-CM | POA: Diagnosis not present

## 2015-01-22 DIAGNOSIS — E0520 Thyrotoxicosis with toxic multinodular goiter without thyrotoxic crisis or storm: Secondary | ICD-10-CM | POA: Diagnosis not present

## 2015-01-22 DIAGNOSIS — Z5181 Encounter for therapeutic drug level monitoring: Secondary | ICD-10-CM

## 2015-01-22 DIAGNOSIS — E1142 Type 2 diabetes mellitus with diabetic polyneuropathy: Secondary | ICD-10-CM | POA: Diagnosis not present

## 2015-01-22 DIAGNOSIS — R899 Unspecified abnormal finding in specimens from other organs, systems and tissues: Secondary | ICD-10-CM

## 2015-01-22 LAB — POCT INR: INR: 2.1

## 2015-01-27 ENCOUNTER — Encounter (HOSPITAL_COMMUNITY)
Admission: RE | Admit: 2015-01-27 | Discharge: 2015-01-27 | Disposition: A | Discharge status: Home / Self Care | Payer: Self-pay | Source: Ambulatory Visit | Attending: Internal Medicine | Admitting: Internal Medicine

## 2015-01-29 ENCOUNTER — Encounter (HOSPITAL_COMMUNITY)
Admission: RE | Admit: 2015-01-29 | Discharge: 2015-01-29 | Disposition: A | Discharge status: Home / Self Care | Payer: Self-pay | Source: Ambulatory Visit | Attending: Internal Medicine | Admitting: Internal Medicine

## 2015-02-03 ENCOUNTER — Encounter (HOSPITAL_COMMUNITY): Payer: Medicare Other | Attending: Internal Medicine

## 2015-02-03 DIAGNOSIS — J449 Chronic obstructive pulmonary disease, unspecified: Secondary | ICD-10-CM | POA: Insufficient documentation

## 2015-02-03 DIAGNOSIS — I429 Cardiomyopathy, unspecified: Secondary | ICD-10-CM | POA: Insufficient documentation

## 2015-02-03 DIAGNOSIS — E119 Type 2 diabetes mellitus without complications: Secondary | ICD-10-CM | POA: Insufficient documentation

## 2015-02-03 DIAGNOSIS — I119 Hypertensive heart disease without heart failure: Secondary | ICD-10-CM | POA: Insufficient documentation

## 2015-02-03 DIAGNOSIS — J45909 Unspecified asthma, uncomplicated: Secondary | ICD-10-CM | POA: Insufficient documentation

## 2015-02-03 DIAGNOSIS — Z5189 Encounter for other specified aftercare: Secondary | ICD-10-CM | POA: Insufficient documentation

## 2015-02-03 DIAGNOSIS — I4891 Unspecified atrial fibrillation: Secondary | ICD-10-CM | POA: Insufficient documentation

## 2015-02-03 DIAGNOSIS — E785 Hyperlipidemia, unspecified: Secondary | ICD-10-CM | POA: Insufficient documentation

## 2015-02-04 ENCOUNTER — Telehealth: Payer: Self-pay | Admitting: *Deleted

## 2015-02-04 DIAGNOSIS — D509 Iron deficiency anemia, unspecified: Secondary | ICD-10-CM

## 2015-02-04 DIAGNOSIS — I119 Hypertensive heart disease without heart failure: Secondary | ICD-10-CM

## 2015-02-04 NOTE — Telephone Encounter (Signed)
Left message to call back  

## 2015-02-04 NOTE — Telephone Encounter (Signed)
-----   Message from Theophilus Kinds, RN sent at 01/22/2015  3:44 PM EDT ----- Pt last saw Dr Mare Ferrari 10/09/14, reminder appt for 4 month follow-up was never put in computer.  Pt is due for f/u in April.  Tried to help pt schedule appt, but no available slots in April.  We were looking at 02/25/15, but available slots held.  Would you please call pt to schedule 4 mo f/u with Dr Mare Ferrari?  Thanks

## 2015-02-05 ENCOUNTER — Encounter (HOSPITAL_COMMUNITY)
Admission: RE | Admit: 2015-02-05 | Discharge: 2015-02-05 | Disposition: A | Discharge status: Home / Self Care | Payer: Self-pay | Source: Ambulatory Visit | Attending: Internal Medicine | Admitting: Internal Medicine

## 2015-02-05 NOTE — Telephone Encounter (Signed)
Follow Up ° °Pt returned call//  °

## 2015-02-05 NOTE — Telephone Encounter (Signed)
Scheduled appointment

## 2015-02-10 ENCOUNTER — Encounter (HOSPITAL_COMMUNITY): Payer: Self-pay

## 2015-02-10 DIAGNOSIS — I119 Hypertensive heart disease without heart failure: Secondary | ICD-10-CM | POA: Insufficient documentation

## 2015-02-10 DIAGNOSIS — E119 Type 2 diabetes mellitus without complications: Secondary | ICD-10-CM | POA: Insufficient documentation

## 2015-02-10 DIAGNOSIS — Z5189 Encounter for other specified aftercare: Secondary | ICD-10-CM | POA: Insufficient documentation

## 2015-02-10 DIAGNOSIS — I4891 Unspecified atrial fibrillation: Secondary | ICD-10-CM | POA: Insufficient documentation

## 2015-02-10 DIAGNOSIS — E785 Hyperlipidemia, unspecified: Secondary | ICD-10-CM | POA: Insufficient documentation

## 2015-02-10 DIAGNOSIS — J449 Chronic obstructive pulmonary disease, unspecified: Secondary | ICD-10-CM | POA: Insufficient documentation

## 2015-02-10 DIAGNOSIS — I429 Cardiomyopathy, unspecified: Secondary | ICD-10-CM | POA: Insufficient documentation

## 2015-02-10 DIAGNOSIS — J45909 Unspecified asthma, uncomplicated: Secondary | ICD-10-CM | POA: Insufficient documentation

## 2015-02-12 ENCOUNTER — Ambulatory Visit (INDEPENDENT_AMBULATORY_CARE_PROVIDER_SITE_OTHER): Payer: Medicare Other | Admitting: *Deleted

## 2015-02-12 DIAGNOSIS — R899 Unspecified abnormal finding in specimens from other organs, systems and tissues: Secondary | ICD-10-CM | POA: Diagnosis not present

## 2015-02-12 DIAGNOSIS — Z5181 Encounter for therapeutic drug level monitoring: Secondary | ICD-10-CM

## 2015-02-12 DIAGNOSIS — I4891 Unspecified atrial fibrillation: Secondary | ICD-10-CM

## 2015-02-12 LAB — POCT INR: INR: 2.3

## 2015-02-17 ENCOUNTER — Encounter (HOSPITAL_COMMUNITY)
Admission: RE | Admit: 2015-02-17 | Discharge: 2015-02-17 | Disposition: A | Discharge status: Home / Self Care | Payer: Self-pay | Source: Ambulatory Visit | Attending: Internal Medicine | Admitting: Internal Medicine

## 2015-02-19 ENCOUNTER — Encounter (HOSPITAL_COMMUNITY)
Admission: RE | Admit: 2015-02-19 | Discharge: 2015-02-19 | Disposition: A | Discharge status: Home / Self Care | Payer: Self-pay | Source: Ambulatory Visit | Attending: Internal Medicine | Admitting: Internal Medicine

## 2015-02-19 DIAGNOSIS — E0520 Thyrotoxicosis with toxic multinodular goiter without thyrotoxic crisis or storm: Secondary | ICD-10-CM | POA: Diagnosis not present

## 2015-02-24 ENCOUNTER — Encounter (HOSPITAL_COMMUNITY)
Admission: RE | Admit: 2015-02-24 | Discharge: 2015-02-24 | Disposition: A | Discharge status: Home / Self Care | Payer: Self-pay | Source: Ambulatory Visit | Attending: Internal Medicine | Admitting: Internal Medicine

## 2015-02-28 ENCOUNTER — Other Ambulatory Visit: Payer: Self-pay | Admitting: Cardiology

## 2015-03-03 ENCOUNTER — Encounter (HOSPITAL_COMMUNITY)
Admission: RE | Admit: 2015-03-03 | Discharge: 2015-03-03 | Disposition: A | Discharge status: Home / Self Care | Payer: Self-pay | Source: Ambulatory Visit | Attending: Internal Medicine | Admitting: Internal Medicine

## 2015-03-10 ENCOUNTER — Encounter (HOSPITAL_COMMUNITY)
Admission: RE | Admit: 2015-03-10 | Discharge: 2015-03-10 | Disposition: A | Discharge status: Home / Self Care | Payer: Self-pay | Source: Ambulatory Visit | Attending: Internal Medicine | Admitting: Internal Medicine

## 2015-03-10 DIAGNOSIS — J45909 Unspecified asthma, uncomplicated: Secondary | ICD-10-CM | POA: Insufficient documentation

## 2015-03-10 DIAGNOSIS — I119 Hypertensive heart disease without heart failure: Secondary | ICD-10-CM | POA: Insufficient documentation

## 2015-03-10 DIAGNOSIS — E785 Hyperlipidemia, unspecified: Secondary | ICD-10-CM | POA: Insufficient documentation

## 2015-03-10 DIAGNOSIS — I4891 Unspecified atrial fibrillation: Secondary | ICD-10-CM | POA: Insufficient documentation

## 2015-03-10 DIAGNOSIS — E119 Type 2 diabetes mellitus without complications: Secondary | ICD-10-CM | POA: Insufficient documentation

## 2015-03-10 DIAGNOSIS — I429 Cardiomyopathy, unspecified: Secondary | ICD-10-CM | POA: Insufficient documentation

## 2015-03-10 DIAGNOSIS — Z5189 Encounter for other specified aftercare: Secondary | ICD-10-CM | POA: Insufficient documentation

## 2015-03-10 DIAGNOSIS — J449 Chronic obstructive pulmonary disease, unspecified: Secondary | ICD-10-CM | POA: Insufficient documentation

## 2015-03-11 ENCOUNTER — Encounter: Payer: Self-pay | Admitting: Cardiology

## 2015-03-11 ENCOUNTER — Ambulatory Visit (INDEPENDENT_AMBULATORY_CARE_PROVIDER_SITE_OTHER): Payer: Medicare Other | Admitting: Cardiology

## 2015-03-11 ENCOUNTER — Ambulatory Visit (INDEPENDENT_AMBULATORY_CARE_PROVIDER_SITE_OTHER): Payer: Medicare Other | Admitting: *Deleted

## 2015-03-11 VITALS — BP 124/72 | HR 81 | Ht 62.0 in | Wt 195.4 lb

## 2015-03-11 DIAGNOSIS — I482 Chronic atrial fibrillation, unspecified: Secondary | ICD-10-CM

## 2015-03-11 DIAGNOSIS — I4891 Unspecified atrial fibrillation: Secondary | ICD-10-CM

## 2015-03-11 DIAGNOSIS — R899 Unspecified abnormal finding in specimens from other organs, systems and tissues: Secondary | ICD-10-CM

## 2015-03-11 DIAGNOSIS — D509 Iron deficiency anemia, unspecified: Secondary | ICD-10-CM

## 2015-03-11 DIAGNOSIS — I119 Hypertensive heart disease without heart failure: Secondary | ICD-10-CM | POA: Diagnosis not present

## 2015-03-11 DIAGNOSIS — Z5181 Encounter for therapeutic drug level monitoring: Secondary | ICD-10-CM

## 2015-03-11 LAB — BASIC METABOLIC PANEL
BUN: 18 mg/dL (ref 6–23)
CHLORIDE: 102 meq/L (ref 96–112)
CO2: 32 meq/L (ref 19–32)
CREATININE: 0.89 mg/dL (ref 0.40–1.20)
Calcium: 10.2 mg/dL (ref 8.4–10.5)
GFR: 65.79 mL/min (ref >60.00)
Glucose, Bld: 113 mg/dL — ABNORMAL HIGH (ref 70–99)
Potassium: 3.9 mEq/L (ref 3.5–5.1)
Sodium: 140 mEq/L (ref 135–145)

## 2015-03-11 LAB — CBC WITH DIFFERENTIAL/PLATELET
BASOS PCT: 0.2 % (ref 0.0–3.0)
Basophils Absolute: 0 10*3/uL (ref 0.0–0.1)
Eosinophils Absolute: 0.2 10*3/uL (ref 0.0–0.7)
Eosinophils Relative: 2.7 % (ref 0.0–5.0)
HCT: 41.3 % (ref 36.0–46.0)
HEMOGLOBIN: 13.6 g/dL (ref 12.0–15.0)
Lymphocytes Relative: 18.8 % (ref 12.0–46.0)
Lymphs Abs: 1.5 10*3/uL (ref 0.7–4.0)
MCHC: 32.9 g/dL (ref 30.0–36.0)
MCV: 79.1 fl (ref 78.0–100.0)
MONO ABS: 0.7 10*3/uL (ref 0.1–1.0)
Monocytes Relative: 9.6 % (ref 3.0–12.0)
NEUTROS ABS: 5.3 10*3/uL (ref 1.4–7.7)
NEUTROS PCT: 68.7 % (ref 43.0–77.0)
Platelets: 307 10*3/uL (ref 150.0–400.0)
RBC: 5.22 Mil/uL — ABNORMAL HIGH (ref 3.87–5.11)
RDW: 17.2 % — ABNORMAL HIGH (ref 11.5–15.5)
WBC: 7.8 10*3/uL (ref 4.0–10.5)

## 2015-03-11 LAB — HEPATIC FUNCTION PANEL
ALK PHOS: 85 U/L (ref 39–117)
ALT: 25 U/L (ref 0–35)
AST: 25 U/L (ref 0–37)
Albumin: 4.2 g/dL (ref 3.5–5.2)
Bilirubin, Direct: 0.1 mg/dL (ref 0.0–0.3)
Total Bilirubin: 0.3 mg/dL (ref 0.2–1.2)
Total Protein: 7.6 g/dL (ref 6.0–8.3)

## 2015-03-11 LAB — LIPID PANEL
CHOL/HDL RATIO: 3
Cholesterol: 238 mg/dL — ABNORMAL HIGH (ref 0–200)
HDL: 69.7 mg/dL (ref >39.00)
LDL Cholesterol: 141 mg/dL — ABNORMAL HIGH (ref 0–99)
NonHDL: 168.3
TRIGLYCERIDES: 137 mg/dL (ref 0.0–149.0)
VLDL: 27.4 mg/dL (ref 0.0–40.0)

## 2015-03-11 LAB — POCT INR: INR: 1.6

## 2015-03-11 MED ORDER — APIXABAN 5 MG PO TABS: 5.0000 mg | ORAL_TABLET | Freq: Two times a day (BID) | ORAL | Status: DC

## 2015-03-11 NOTE — Progress Notes (Signed)
Cardiology Office Note   Date:  03/11/2015   ID:  Renee Ray, DOB 12-Jun-1941, MRN [id number]  PCP:  Renee Agreste, MD  Cardiologist: Renee Coco MD  Chief Complaint  Patient presents with  . Labs Only      History of Present Illness: Renee Ray is a 74 y.o. female who presents for a four-month follow-up office visit.  This pleasant 74 year old woman is seen for a scheduled four-month followup office visit. She has a history of diabetes. Her diabetes is followed by Dr. Buddy Ray. She has a history of high blood pressure and a history of established chronic atrial fibrillation. She had a remote attempt at cardioversion which was unsuccessful. She has not been having any symptoms from her atrial fibrillation. He also has a history of asthma and is followed by pulmonary. She participates in the pulmonary rehabilitation program. Since last visit she has been feeling well. 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 She had an echocardiogram in 03/12/14 which showed normal ejection fraction of 60-65% and there was diastolic dysfunction. Recently she had some blood work at her PCP office and was found to have anemia. Workup of this showed that it was an iron deficiency anemia. She was told at that time that she would probably need to have a GI workup. She has not been aware of any hematochezia or melena. She was placed empirically on oral iron therapy. She has not been taking the iron tablets on a consistent basis because of side effects of constipation. She has felt more fatigued and due to her anemia. Since we last saw her she had upper endoscopy and colonoscopy and no evidence of active bleeding was found.  The possibility of subsequent capsule endoscopy was raised at the time.  This will occur if the patient continues to bleed. She has been feeling well.  She still goes to the cardiopulmonary rehabilitation program twice a week  for exercise. Her diabetes has been reasonably well controlled with most recent hemoglobin A1c of 7.0.  Dr. Buddy Ray follows her for her diabetes  Past Medical History  Diagnosis Date  . Hypertension   . Hyperlipidemia   . Chronic anticoagulation COUMADIN  . Normal nuclear stress test 2008  . Abnormal echocardiogram 2010    showing slightly enlarged left atrium with normal LV function and normal PA pressures.   . Chronic atrial fibrillation CARDIOLOGIST- DR Renee Ray-- LAST VISIT  12-16-2011 IN [facility]  . Acute medial meniscal injury of knee RIGHT KNEE  . Asthmatic bronchitis , chronic   . OA (osteoarthritis) of knee HANDS  . History of pneumonia associated w/  rhabdomyolysis  OCT 2012  . COPD, moderate PULMOLOGIST- DR Renee Ray --  LAST VISIT NOTE 02-23-2012 IN EPIC  . Bruise RIGHT ABD. DUE TO LOVENOX INJECTION  02-27-2012  . Chronic renal insufficiency   . Diabetes mellitus INSULIN AND ORAL MEDS  . Non-ischemic cardiomyopathy normal ef  . Atrial fibrillation   . Goiter 12/15    will have radiation in February    Past Surgical History  Procedure Laterality Date  . Breast enhancement surgery  1970    1990--- BREAST IMPLANTS REMOVED   . Vaginal hysterectomy  1978  . Knee arthroscopy  03/02/2012    Procedure: ARTHROSCOPY KNEE;  Surgeon: Renee Alf, MD;  Location: [facility];  Service: Orthopedics;  Laterality: Right;  WITH DEBRIDEMENT of medial menisces     Current Outpatient Prescriptions  Medication Sig Dispense Refill  . b complex vitamins capsule Take 1 capsule by mouth daily.     . calcium carbonate (OS-CAL) 600 MG TABS Take 600 mg by mouth daily.     . cholecalciferol (VITAMIN D) 1000 UNITS tablet Take 1,000 Units by mouth daily.     . ferrous sulfate 325 (65 FE) MG tablet Take 325 mg by mouth daily with breakfast.    . furosemide (LASIX) 40 MG tablet TAKE 1 TABLET BY MOUTH ONCE DAILY 90 tablet 1  . glucose blood test strip as directed.    . insulin glargine  (LANTUS) 100 UNIT/ML injection Inject 40 Units into the skin at bedtime.     [name] KLOR-CON M20 20 MEQ tablet TAKE 1 TABLET BY MOUTH ONCE DAILY 30 tablet 6  . losartan-hydrochlorothiazide (HYZAAR) 100-25 MG per tablet TAKE 1 TABLET BY MOUTH DAILY. 90 tablet 0  . metFORMIN (GLUCOPHAGE) 1000 MG tablet Take by mouth. Take 1000mg  every morning and 500mg  every evening    . Multiple Vitamin (MULTIVITAMIN) tablet Take 1 tablet by mouth daily.     . Omega-3 Fatty Acids (FISH OIL PO) Take 1 tablet by mouth daily.     [name] PROAIR HFA 108 (90 BASE) MCG/ACT inhaler INHALE 2 PUFFS EVERY 4 HOURS AS NEEDED. 8.5 each 5  . rosuvastatin (CRESTOR) 5 MG tablet Take 5 mg by mouth daily.    . sitaGLIPtin (JANUVIA) 50 MG tablet Take 50 mg by mouth daily.    [name] SPIRIVA HANDIHALER 18 MCG inhalation capsule INHALE 1 CAPSULE VIA HANDIHALER EVERY MORNING 30 capsule 4  . apixaban (ELIQUIS) 5 MG TABS tablet Take 1 tablet (5 mg total) by mouth 2 (two) times daily. 60 tablet 5   No current facility-administered medications for this visit.    Allergies:   Janumet; Amoxicillin; and Sulfonamide derivatives    Social History:  The patient  reports that she quit smoking about 18 years ago. Her smoking use included Cigarettes. She quit after 20 years of use. She has never used smokeless tobacco. She reports that she does not drink alcohol or use illicit drugs.   Family History:  The patient's family history includes Arrhythmia in her mother; Diabetes in her brother; Heart attack in her father; Heart disease in her brother; Heart failure in her mother; Stroke in her mother.    ROS:  Please see the history of present illness.   Otherwise, review of systems are positive for none.   All other systems are reviewed and negative.    PHYSICAL EXAM: VS:  BP 124/72 mmHg  Pulse 81  Ht 5\' 2"  (1.575 m)  Wt 195 lb 6.4 oz (88.633 kg)  BMI 35.73 kg/m2 , BMI Body mass index is 35.73 kg/(m^2). GEN: Well nourished, well developed, in no acute  distress HEENT: normal Neck: no JVD, carotid bruits, or masses Cardiac: Irregularly irregular.; no murmurs, rubs, or gallops,no edema  Respiratory:  clear to auscultation bilaterally, normal work of breathing GI: soft, nontender, nondistended, + BS MS: no deformity or atrophy Skin: warm and dry, no rash Neuro:  Strength and sensation are intact Psych: euthymic mood, full affect   EKG:  EKG is not ordered today    Recent Labs: 08/13/2014: TSH 0.026* 03/11/2015: ALT 25; BUN 18; Creatinine 0.89; Hemoglobin 13.6; Platelets 307.0; Potassium 3.9; Sodium 140    Lipid Panel    Component Value Date/Time   CHOL 238* 03/11/2015 1204   TRIG 137.0 03/11/2015 1204   HDL 69.70 03/11/2015 1204  CHOLHDL 3 03/11/2015 1204   VLDL 27.4 03/11/2015 1204   LDLCALC 141* 03/11/2015 1204   LDLDIRECT 165.6 12/16/2011 0942      Wt Readings from Last 3 Encounters:  03/11/15 195 lb 6.4 oz (88.633 kg)  11/13/14 197 lb 12 oz (89.7 kg)  10/16/14 198 lb 12.8 oz (90.175 kg)        ASSESSMENT AND PLAN:  1.1. permanent atrial fibrillation.  We had a discussion regarding long-term anticoagulation.  Her INRs have been somewhat labile.  At this time we will switch her from warfarin to Eliquis 5 mg twice a day. 2. Hypercholesterolemia 3. essential hypertension without heart failure 4. diabetes mellitus followed by Dr. Buddy Ray 5. asthma followed by Dr. Annamaria Boots 6. Iron deficiency anemia.  Recent colonoscopy and upper endoscopy showed no evidence of active bleeding  Disposition: we are checking blood work today.  Recheck here in 4 months for office visit CBC lipid panel hepatic function panel and basal metabolic panel     Current medicines are reviewed at length with the patient today.  The patient does not have concerns regarding medicines.  The following changes have been made:  no change  Labs/ tests ordered today include:   Orders Placed This Encounter  Procedures  . Lipid panel  . Hepatic  function panel  . Basic metabolic panel  . CBC with Differential/Platelet      Signed, Renee Coco MD 03/11/2015 6:30 PM    [address], [place], [place]  [address] Phone: [phone]; Fax: [phone]

## 2015-03-11 NOTE — Patient Instructions (Addendum)
Medication Instructions:  Will have your Coumadin checked get you changed over to Eliquis 5 mg twice a day  STOP YOUR ASPIRIN   Labwork: Lp/bmet/hfp/cbc  Testing/Procedures: None   Follow-Up: Your physician recommends that you schedule a follow-up appointment in: 4 months with fasting labs (lp/bmet/hfp/cbc)   Any Other Special Instructions Will Be Listed Below (If Applicable).

## 2015-03-11 NOTE — Progress Notes (Signed)
Quick Note:  Please report to patient. The recent labs are stable. Continue same medication and careful diet. Hgb is better 13.6. Cholesterol is higher. ______

## 2015-03-12 ENCOUNTER — Telehealth: Payer: Self-pay

## 2015-03-12 ENCOUNTER — Encounter (HOSPITAL_COMMUNITY)
Admission: RE | Admit: 2015-03-12 | Discharge: 2015-03-12 | Disposition: A | Discharge status: Home / Self Care | Payer: Self-pay | Source: Ambulatory Visit | Attending: Internal Medicine | Admitting: Internal Medicine

## 2015-03-12 NOTE — Telephone Encounter (Signed)
Prior auth  Sent to [facility] via Cover My Meds for Eliquis 5 mg.

## 2015-03-13 ENCOUNTER — Telehealth: Payer: Self-pay

## 2015-03-13 NOTE — Telephone Encounter (Signed)
Fax from [place] [facility] with approval  For Eliquis 5mg  tabs good thru 05/05/217. [id number].

## 2015-03-17 ENCOUNTER — Encounter (HOSPITAL_COMMUNITY)
Admission: RE | Admit: 2015-03-17 | Discharge: 2015-03-17 | Disposition: A | Discharge status: Home / Self Care | Payer: Self-pay | Source: Ambulatory Visit | Attending: Internal Medicine | Admitting: Internal Medicine

## 2015-03-19 ENCOUNTER — Encounter (HOSPITAL_COMMUNITY)
Admission: RE | Admit: 2015-03-19 | Discharge: 2015-03-19 | Disposition: A | Discharge status: Home / Self Care | Payer: Self-pay | Source: Ambulatory Visit | Attending: Internal Medicine | Admitting: Internal Medicine

## 2015-03-19 DIAGNOSIS — E0520 Thyrotoxicosis with toxic multinodular goiter without thyrotoxic crisis or storm: Secondary | ICD-10-CM | POA: Diagnosis not present

## 2015-03-24 ENCOUNTER — Encounter (HOSPITAL_COMMUNITY): Payer: Self-pay

## 2015-03-31 ENCOUNTER — Encounter (HOSPITAL_COMMUNITY): Admission: RE | Admit: 2015-03-31 | Payer: Self-pay | Source: Ambulatory Visit

## 2015-04-07 ENCOUNTER — Other Ambulatory Visit: Payer: Self-pay | Admitting: Cardiology

## 2015-04-07 ENCOUNTER — Encounter (HOSPITAL_COMMUNITY): Payer: Self-pay

## 2015-04-08 ENCOUNTER — Ambulatory Visit (INDEPENDENT_AMBULATORY_CARE_PROVIDER_SITE_OTHER): Payer: Medicare Other | Admitting: *Deleted

## 2015-04-08 DIAGNOSIS — I4891 Unspecified atrial fibrillation: Secondary | ICD-10-CM

## 2015-04-08 DIAGNOSIS — R899 Unspecified abnormal finding in specimens from other organs, systems and tissues: Secondary | ICD-10-CM

## 2015-04-08 LAB — BASIC METABOLIC PANEL
BUN: 23 mg/dL (ref 6–23)
CALCIUM: 9.8 mg/dL (ref 8.4–10.5)
CO2: 32 mEq/L (ref 19–32)
Chloride: 102 mEq/L (ref 96–112)
Creatinine, Ser: 0.91 mg/dL (ref 0.40–1.20)
GFR: 64.11 mL/min (ref >60.00)
Glucose, Bld: 150 mg/dL — ABNORMAL HIGH (ref 70–99)
POTASSIUM: 4 meq/L (ref 3.5–5.1)
Sodium: 139 mEq/L (ref 135–145)

## 2015-04-08 LAB — CBC
HEMATOCRIT: 38.1 % (ref 36.0–46.0)
HEMOGLOBIN: 12.5 g/dL (ref 12.0–15.0)
MCHC: 32.8 g/dL (ref 30.0–36.0)
MCV: 82.5 fl (ref 78.0–100.0)
Platelets: 274 10*3/uL (ref 150.0–400.0)
RBC: 4.62 Mil/uL (ref 3.87–5.11)
RDW: 18.2 % — AB (ref 11.5–15.5)
WBC: 6.1 10*3/uL (ref 4.0–10.5)

## 2015-04-08 NOTE — Progress Notes (Signed)
Pt was started on Eliquis 5mg  bid  for Atrial Fib on may 4,2016.    Reviewed patients medication list.  Pt  currently on any combined P-gp and strong CYP3A4 inhibitors/inducers (ketoconazole, traconazole, ritonavir, carbamazepine, phenytoin, rifampin, St. John's wort).  Reviewed labs.  SCr 0.91 , Weight  93.27kg, .  Dose is  appropriate based on age weight and SrCr   Hgb 12.5 and HCT 38.1  A full discussion of the nature of anticoagulants has been carried out.  A benefit/risk analysis has been presented to the patient, so that they understand the justification for choosing anticoagulation with Eliquis at this time.  The need for compliance is stressed.  Pt is aware to take the medication twice daily.  Side effects of potential bleeding are discussed, including unusual colored urine or stools, coughing up blood or coffee ground emesis, nose bleeds or serious fall or head trauma.  Discussed signs and symptoms of stroke. The patient should avoid any OTC items containing aspirin or ibuprofen.  Avoid alcohol consumption.   Call if any signs of abnormal bleeding.  Discussed financial obligations and states is having  no  difficulty in obtaining medication.  Next lab test test in 6 months.  Pt states has had no sign or symptom of bleeding nor sign or symptom of stroke . States did miss one dose of Eliquis and states has been started on  Levothyroxine  for hypothyroidism Discussed Eliquis with pt and she states understanding Will obtain CBC and BMET today and will call with results 04/09/2015 Called pt and informed on correct dose of Eliquis and to continue that dose and appt made to be rechecked in 6 months and she states understanding

## 2015-04-09 NOTE — Progress Notes (Signed)
Quick Note:  Please report to patient. The recent labs are stable. Continue same medication and careful diet. ______ 

## 2015-04-11 ENCOUNTER — Other Ambulatory Visit: Payer: Self-pay | Admitting: Cardiology

## 2015-04-13 ENCOUNTER — Other Ambulatory Visit: Payer: Self-pay | Admitting: Cardiology

## 2015-04-13 NOTE — Telephone Encounter (Signed)
Is the patient still to be taking this? It was d'cd off of her list 03/11/15 for change in therapy. Please advise. Thanks, MI

## 2015-04-13 NOTE — Telephone Encounter (Signed)
Patient still taking, refilled Crestor

## 2015-04-14 ENCOUNTER — Encounter (HOSPITAL_COMMUNITY): Payer: Medicare Other | Attending: Internal Medicine

## 2015-04-14 DIAGNOSIS — E785 Hyperlipidemia, unspecified: Secondary | ICD-10-CM | POA: Insufficient documentation

## 2015-04-14 DIAGNOSIS — I429 Cardiomyopathy, unspecified: Secondary | ICD-10-CM | POA: Insufficient documentation

## 2015-04-14 DIAGNOSIS — J449 Chronic obstructive pulmonary disease, unspecified: Secondary | ICD-10-CM | POA: Insufficient documentation

## 2015-04-14 DIAGNOSIS — I119 Hypertensive heart disease without heart failure: Secondary | ICD-10-CM | POA: Insufficient documentation

## 2015-04-14 DIAGNOSIS — I4891 Unspecified atrial fibrillation: Secondary | ICD-10-CM | POA: Insufficient documentation

## 2015-04-14 DIAGNOSIS — J45909 Unspecified asthma, uncomplicated: Secondary | ICD-10-CM | POA: Insufficient documentation

## 2015-04-14 DIAGNOSIS — E119 Type 2 diabetes mellitus without complications: Secondary | ICD-10-CM | POA: Insufficient documentation

## 2015-04-14 DIAGNOSIS — Z5189 Encounter for other specified aftercare: Secondary | ICD-10-CM | POA: Insufficient documentation

## 2015-04-17 ENCOUNTER — Other Ambulatory Visit: Payer: Self-pay | Admitting: Cardiology

## 2015-04-21 ENCOUNTER — Encounter (HOSPITAL_COMMUNITY): Payer: Self-pay

## 2015-04-24 DIAGNOSIS — E1142 Type 2 diabetes mellitus with diabetic polyneuropathy: Secondary | ICD-10-CM | POA: Diagnosis not present

## 2015-04-24 DIAGNOSIS — M1990 Unspecified osteoarthritis, unspecified site: Secondary | ICD-10-CM | POA: Diagnosis not present

## 2015-04-24 DIAGNOSIS — E890 Postprocedural hypothyroidism: Secondary | ICD-10-CM | POA: Diagnosis not present

## 2015-04-24 DIAGNOSIS — E0520 Thyrotoxicosis with toxic multinodular goiter without thyrotoxic crisis or storm: Secondary | ICD-10-CM | POA: Diagnosis not present

## 2015-04-24 DIAGNOSIS — Z794 Long term (current) use of insulin: Secondary | ICD-10-CM | POA: Diagnosis not present

## 2015-04-28 ENCOUNTER — Encounter (HOSPITAL_COMMUNITY): Payer: Self-pay

## 2015-05-05 ENCOUNTER — Encounter (HOSPITAL_COMMUNITY): Payer: Self-pay

## 2015-05-08 DIAGNOSIS — E890 Postprocedural hypothyroidism: Secondary | ICD-10-CM | POA: Diagnosis not present

## 2015-05-08 DIAGNOSIS — E1142 Type 2 diabetes mellitus with diabetic polyneuropathy: Secondary | ICD-10-CM | POA: Diagnosis not present

## 2015-05-08 LAB — TSH: TSH: 41.22 u[IU]/mL — AB (ref <=5.90)

## 2015-05-08 LAB — BASIC METABOLIC PANEL: Glucose: 157 mg/dL

## 2015-05-08 LAB — HEMOGLOBIN A1C: HEMOGLOBIN A1C: 7.1 % — AB (ref 4.0–6.0)

## 2015-05-12 ENCOUNTER — Encounter (HOSPITAL_COMMUNITY): Payer: Medicare Other | Attending: Internal Medicine

## 2015-05-12 DIAGNOSIS — E785 Hyperlipidemia, unspecified: Secondary | ICD-10-CM | POA: Insufficient documentation

## 2015-05-12 DIAGNOSIS — J449 Chronic obstructive pulmonary disease, unspecified: Secondary | ICD-10-CM | POA: Diagnosis not present

## 2015-05-12 DIAGNOSIS — I4891 Unspecified atrial fibrillation: Secondary | ICD-10-CM | POA: Diagnosis not present

## 2015-05-12 DIAGNOSIS — J45909 Unspecified asthma, uncomplicated: Secondary | ICD-10-CM | POA: Insufficient documentation

## 2015-05-12 DIAGNOSIS — I429 Cardiomyopathy, unspecified: Secondary | ICD-10-CM | POA: Diagnosis not present

## 2015-05-12 DIAGNOSIS — I119 Hypertensive heart disease without heart failure: Secondary | ICD-10-CM | POA: Diagnosis not present

## 2015-05-12 DIAGNOSIS — E119 Type 2 diabetes mellitus without complications: Secondary | ICD-10-CM | POA: Diagnosis not present

## 2015-05-12 DIAGNOSIS — Z5189 Encounter for other specified aftercare: Secondary | ICD-10-CM | POA: Diagnosis not present

## 2015-05-19 ENCOUNTER — Encounter (HOSPITAL_COMMUNITY): Payer: Self-pay

## 2015-05-26 ENCOUNTER — Encounter (HOSPITAL_COMMUNITY): Payer: Self-pay

## 2015-05-27 ENCOUNTER — Other Ambulatory Visit: Payer: Self-pay | Admitting: Cardiology

## 2015-06-02 ENCOUNTER — Encounter (HOSPITAL_COMMUNITY): Payer: Self-pay

## 2015-06-09 ENCOUNTER — Encounter (HOSPITAL_COMMUNITY): Payer: Medicare Other

## 2015-06-09 DIAGNOSIS — E785 Hyperlipidemia, unspecified: Secondary | ICD-10-CM | POA: Insufficient documentation

## 2015-06-09 DIAGNOSIS — I119 Hypertensive heart disease without heart failure: Secondary | ICD-10-CM | POA: Diagnosis not present

## 2015-06-09 DIAGNOSIS — I429 Cardiomyopathy, unspecified: Secondary | ICD-10-CM | POA: Diagnosis not present

## 2015-06-09 DIAGNOSIS — I4891 Unspecified atrial fibrillation: Secondary | ICD-10-CM | POA: Diagnosis not present

## 2015-06-09 DIAGNOSIS — E119 Type 2 diabetes mellitus without complications: Secondary | ICD-10-CM | POA: Diagnosis not present

## 2015-06-09 DIAGNOSIS — Z5189 Encounter for other specified aftercare: Secondary | ICD-10-CM | POA: Insufficient documentation

## 2015-06-09 DIAGNOSIS — J45909 Unspecified asthma, uncomplicated: Secondary | ICD-10-CM | POA: Insufficient documentation

## 2015-06-09 DIAGNOSIS — J449 Chronic obstructive pulmonary disease, unspecified: Secondary | ICD-10-CM | POA: Diagnosis not present

## 2015-06-15 ENCOUNTER — Encounter: Payer: Self-pay | Admitting: *Deleted

## 2015-06-16 ENCOUNTER — Encounter (HOSPITAL_COMMUNITY): Payer: Self-pay

## 2015-06-23 ENCOUNTER — Encounter (HOSPITAL_COMMUNITY): Payer: Self-pay

## 2015-06-29 ENCOUNTER — Telehealth: Payer: Self-pay | Admitting: *Deleted

## 2015-06-29 NOTE — Telephone Encounter (Signed)
Patient phoned and left message that Dr. Delrae Rend is managing her diabetes and that she recently had her A1c completed 05/12/15 with a result of 7.1.  Will contact endocrinology office to confirm.  Phoned Endocrinology and spoke with [facility] and she faxed over patient's most recent progress notes and labs.   Updated health maintenance, added endocrinologist to care team, abstracted pertinent labs, and sent to PCP to review.

## 2015-06-30 ENCOUNTER — Encounter (HOSPITAL_COMMUNITY): Payer: Self-pay

## 2015-07-01 DIAGNOSIS — M2550 Pain in unspecified joint: Secondary | ICD-10-CM | POA: Diagnosis not present

## 2015-07-01 DIAGNOSIS — M79641 Pain in right hand: Secondary | ICD-10-CM | POA: Diagnosis not present

## 2015-07-01 DIAGNOSIS — M7072 Other bursitis of hip, left hip: Secondary | ICD-10-CM | POA: Diagnosis not present

## 2015-07-01 DIAGNOSIS — M25562 Pain in left knee: Secondary | ICD-10-CM | POA: Diagnosis not present

## 2015-07-01 DIAGNOSIS — R5381 Other malaise: Secondary | ICD-10-CM | POA: Diagnosis not present

## 2015-07-01 DIAGNOSIS — M25551 Pain in right hip: Secondary | ICD-10-CM | POA: Diagnosis not present

## 2015-07-01 DIAGNOSIS — M79642 Pain in left hand: Secondary | ICD-10-CM | POA: Diagnosis not present

## 2015-07-01 DIAGNOSIS — M25561 Pain in right knee: Secondary | ICD-10-CM | POA: Diagnosis not present

## 2015-07-01 DIAGNOSIS — Z79899 Other long term (current) drug therapy: Secondary | ICD-10-CM | POA: Diagnosis not present

## 2015-07-07 ENCOUNTER — Ambulatory Visit (INDEPENDENT_AMBULATORY_CARE_PROVIDER_SITE_OTHER): Payer: Medicare Other

## 2015-07-07 ENCOUNTER — Ambulatory Visit (INDEPENDENT_AMBULATORY_CARE_PROVIDER_SITE_OTHER): Payer: Medicare Other | Admitting: Podiatry

## 2015-07-07 ENCOUNTER — Encounter (HOSPITAL_COMMUNITY)
Admission: RE | Admit: 2015-07-07 | Discharge: 2015-07-07 | Disposition: A | Discharge status: Home / Self Care | Payer: Self-pay | Source: Ambulatory Visit | Attending: Internal Medicine | Admitting: Internal Medicine

## 2015-07-07 ENCOUNTER — Telehealth: Payer: Self-pay | Admitting: Internal Medicine

## 2015-07-07 ENCOUNTER — Encounter: Payer: Self-pay | Admitting: Podiatry

## 2015-07-07 VITALS — Ht 62.0 in | Wt 199.0 lb

## 2015-07-07 DIAGNOSIS — E119 Type 2 diabetes mellitus without complications: Secondary | ICD-10-CM

## 2015-07-07 DIAGNOSIS — M779 Enthesopathy, unspecified: Secondary | ICD-10-CM

## 2015-07-07 DIAGNOSIS — M7751 Other enthesopathy of right foot: Secondary | ICD-10-CM | POA: Diagnosis not present

## 2015-07-07 DIAGNOSIS — E0590 Thyrotoxicosis, unspecified without thyrotoxic crisis or storm: Secondary | ICD-10-CM | POA: Diagnosis not present

## 2015-07-07 DIAGNOSIS — M2041 Other hammer toe(s) (acquired), right foot: Secondary | ICD-10-CM | POA: Diagnosis not present

## 2015-07-07 DIAGNOSIS — Q828 Other specified congenital malformations of skin: Secondary | ICD-10-CM | POA: Diagnosis not present

## 2015-07-07 DIAGNOSIS — M778 Other enthesopathies, not elsewhere classified: Secondary | ICD-10-CM

## 2015-07-07 NOTE — Progress Notes (Signed)
   Subjective:    Patient ID: Renee Ray, female    DOB: [birth year], 74 y.o.   MRN: [id number]  HPI Comments:   Diabetic x 5 years and last A1C was 7.1.  Toe Pain    she presents today with a chief complaint of a painful hammertoe deformity fifth right with a callus. She states this been like this for many years and she's had several different doctors work on it. He'll stay calm for several months and then start to flare up again. She says it hurts so badly consider amputation of my  Toe.   Review of Systems  Cardiovascular:       Calf pain with walking   Musculoskeletal: Positive for myalgias, arthralgias and gait problem.  All other systems reviewed and are negative.      Objective:   Physical Exam  : 74 year old female no acute distress vital signs stable oriented 3 pulses are strongly palpable bilaterally. Neurologic sensorium is intact versus once the monofilament. Deep tendon reflexes are intact bilateral and muscle strength +5 over 5 dorsiflexion plantar flexors and inverters everters bulges musculatures intact. Orthopedic evaluation was resulted was distal to the ankle for range of motion without crepitation. She has adductor varus rotated hammertoe deformity which is rigid in nature under lapping a hammertoe deformity fourth digit right foot. Radiograph confirms. Cutaneous evaluation does demonstrate a reactive hyperkeratosis to the distal medial aspect of the fifth digit right foot. No erythema edema cellulitis drainage or odor.        Assessment & Plan:   assessment: diabetes mellitus. Porokeratosis fifth digit right foot with hammertoe deformities. Capsulitis fifth digit right  Plan: Discussed etiology pathology conservative versus surgical therapies injected the area today with dexamethasone local and aesthetic to the PIPJ fifth digit right. I also debrided the reactive hyperkeratosis today an place padding. She will follow up with [place] as needed.

## 2015-07-07 NOTE — Telephone Encounter (Signed)
Pt is aware that CY is back in the office tomorrow morning and will sign then. I have the form with me. Thanks.

## 2015-07-07 NOTE — Progress Notes (Signed)
Pulmonary discharge note. Pt has been discharged from the Pulmonary Maintenance Program due to health issues rheumatoid arthritis and a corn on her foot. The last day that she attended was 03/19/15.

## 2015-07-07 NOTE — Telephone Encounter (Signed)
Will forward to [facility] per her request.

## 2015-07-08 ENCOUNTER — Other Ambulatory Visit (INDEPENDENT_AMBULATORY_CARE_PROVIDER_SITE_OTHER): Payer: Medicare Other | Admitting: *Deleted

## 2015-07-08 DIAGNOSIS — I119 Hypertensive heart disease without heart failure: Secondary | ICD-10-CM | POA: Diagnosis not present

## 2015-07-08 DIAGNOSIS — D509 Iron deficiency anemia, unspecified: Secondary | ICD-10-CM | POA: Diagnosis not present

## 2015-07-08 LAB — HEPATIC FUNCTION PANEL
ALBUMIN: 4 g/dL (ref 3.5–5.2)
ALK PHOS: 62 U/L (ref 39–117)
ALT: 23 U/L (ref 0–35)
AST: 20 U/L (ref 0–37)
BILIRUBIN DIRECT: 0.1 mg/dL (ref 0.0–0.3)
BILIRUBIN TOTAL: 0.4 mg/dL (ref 0.2–1.2)
Total Protein: 6.9 g/dL (ref 6.0–8.3)

## 2015-07-08 LAB — CBC WITH DIFFERENTIAL/PLATELET
BASOS ABS: 0 10*3/uL (ref 0.0–0.1)
BASOS PCT: 0.2 % (ref 0.0–3.0)
EOS ABS: 0.1 10*3/uL (ref 0.0–0.7)
Eosinophils Relative: 2.2 % (ref 0.0–5.0)
HEMATOCRIT: 37.6 % (ref 36.0–46.0)
Hemoglobin: 12.4 g/dL (ref 12.0–15.0)
LYMPHS ABS: 1.2 10*3/uL (ref 0.7–4.0)
LYMPHS PCT: 21.2 % (ref 12.0–46.0)
MCHC: 33.1 g/dL (ref 30.0–36.0)
MCV: 89.9 fl (ref 78.0–100.0)
Monocytes Absolute: 0.6 10*3/uL (ref 0.1–1.0)
Monocytes Relative: 11.6 % (ref 3.0–12.0)
NEUTROS ABS: 3.6 10*3/uL (ref 1.4–7.7)
NEUTROS PCT: 64.8 % (ref 43.0–77.0)
PLATELETS: 268 10*3/uL (ref 150.0–400.0)
RBC: 4.18 Mil/uL (ref 3.87–5.11)
RDW: 14.1 % (ref 11.5–15.5)
WBC: 5.5 10*3/uL (ref 4.0–10.5)

## 2015-07-08 LAB — LIPID PANEL
CHOL/HDL RATIO: 3
Cholesterol: 178 mg/dL (ref 0–200)
HDL: 64.1 mg/dL (ref >39.00)
LDL Cholesterol: 95 mg/dL (ref 0–99)
NONHDL: 113.44
Triglycerides: 91 mg/dL (ref 0.0–149.0)
VLDL: 18.2 mg/dL (ref 0.0–40.0)

## 2015-07-08 LAB — BASIC METABOLIC PANEL
BUN: 20 mg/dL (ref 6–23)
CALCIUM: 9.8 mg/dL (ref 8.4–10.5)
CHLORIDE: 103 meq/L (ref 96–112)
CO2: 32 meq/L (ref 19–32)
CREATININE: 0.88 mg/dL (ref 0.40–1.20)
GFR: 66.59 mL/min (ref >60.00)
GLUCOSE: 95 mg/dL (ref 70–99)
Potassium: 3.7 mEq/L (ref 3.5–5.1)
Sodium: 141 mEq/L (ref 135–145)

## 2015-07-08 NOTE — Addendum Note (Signed)
Addended by: Eulis Foster on: 07/08/2015 10:03 AM   Modules accepted: Orders

## 2015-07-08 NOTE — Telephone Encounter (Signed)
Done

## 2015-07-08 NOTE — Telephone Encounter (Signed)
Form given to CY to fill out.

## 2015-07-08 NOTE — Progress Notes (Signed)
Quick Note:  Please make copy of labs for patient visit. ______ 

## 2015-07-09 NOTE — Telephone Encounter (Signed)
Handicap placard at front for pick up-I have tried several times to call patient but phone will not dial out to patients phone number(attempted on several work phones). Please try to call patient back later today. Thanks.

## 2015-07-09 NOTE — Telephone Encounter (Signed)
Joellen Jersey do you have these forms?  Thanks!

## 2015-07-09 NOTE — Telephone Encounter (Signed)
lmtcb for pt.  

## 2015-07-10 ENCOUNTER — Telehealth: Payer: Self-pay | Admitting: Cardiology

## 2015-07-10 NOTE — Telephone Encounter (Signed)
Patient notified that form is at front to be picked up. Patient states she will come by to pick up today. Nothing further needed.

## 2015-07-10 NOTE — Telephone Encounter (Signed)
Reviewed with patient and copy printed to give at ov next week

## 2015-07-10 NOTE — Telephone Encounter (Signed)
New message ° ° ° ° ° ° °Calling to get lab results °

## 2015-07-14 ENCOUNTER — Encounter: Payer: Self-pay | Admitting: *Deleted

## 2015-07-14 ENCOUNTER — Encounter (HOSPITAL_COMMUNITY): Payer: Medicare Other

## 2015-07-15 ENCOUNTER — Encounter: Payer: Self-pay | Admitting: Cardiology

## 2015-07-15 ENCOUNTER — Ambulatory Visit (INDEPENDENT_AMBULATORY_CARE_PROVIDER_SITE_OTHER): Payer: Medicare Other | Admitting: Cardiology

## 2015-07-15 VITALS — BP 116/66 | HR 107 | Ht 62.0 in | Wt 194.1 lb

## 2015-07-15 DIAGNOSIS — I482 Chronic atrial fibrillation, unspecified: Secondary | ICD-10-CM

## 2015-07-15 DIAGNOSIS — D509 Iron deficiency anemia, unspecified: Secondary | ICD-10-CM | POA: Diagnosis not present

## 2015-07-15 DIAGNOSIS — I119 Hypertensive heart disease without heart failure: Secondary | ICD-10-CM | POA: Diagnosis not present

## 2015-07-15 NOTE — Patient Instructions (Signed)
Medication Instructions:  Your physician recommends that you continue on your current medications as directed. Please refer to the Current Medication list given to you today.  Labwork: none  Testing/Procedures: none  Follow-Up: Your physician wants you to follow-up in: 4 months with fasting labs (lp/bmet/hfp/cbc)  You will receive a reminder letter in the mail two months in advance. If you don't receive a letter, please call our office to schedule the follow-up appointment.

## 2015-07-15 NOTE — Progress Notes (Signed)
Cardiology Office Note   Date:  07/15/2015   ID:  Renee Ray, DOB 1941-06-18, MRN [id number]  PCP:  [name], MD  Cardiologist: Darlin Coco MD  No chief complaint on file.     History of Present Illness: Renee Ray is a 74 y.o. female who presents for follow-up visit. This pleasant 74 year old woman is seen for a scheduled four-month followup office visit. She has a history of diabetes. Her diabetes is followed by Dr. Buddy Duty. She has a history of high blood pressure and a history of established chronic atrial fibrillation. She had a remote attempt at cardioversion which was unsuccessful. She has not been having any symptoms from her atrial fibrillation. He also has a history of asthma and is followed by pulmonary. She participates in the pulmonary rehabilitation program. Since last visit she has been feeling well. 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 She had an echocardiogram in 03/12/14 which showed normal ejection fraction of 60-65% and there was diastolic dysfunction. Recently she had some blood work at her PCP office and was found to have anemia. Workup of this showed that it was an iron deficiency anemia. She was told at that time that she would probably need to have a GI workup. She has not been aware of any hematochezia or melena. She was placed empirically on oral iron therapy. She has not been taking the iron tablets on a consistent basis because of side effects of constipation. She has felt more fatigued and due to her anemia. Since we last saw her she had upper endoscopy and colonoscopy and no evidence of active bleeding was found. She was found to be underactive in terms of her thyroid and Dr. Buddy Duty has increased her Synthroid dose.  This may have been affecting her bowel function and constipation. She has been feeling well. She still goes to the cardiopulmonary rehabilitation program twice a week for  exercise. Her diabetes has been reasonably well controlled with most recent hemoglobin A1c of 7.0. Dr. Buddy Duty follows her for her diabetes. She has not been having any racing of her heart.  No chest pain or increased shortness of breath.  Past Medical History  Diagnosis Date  . Hypertension   . Hyperlipidemia   . Chronic anticoagulation     coumadin  . Normal nuclear stress test 2008  . Abnormal echocardiogram 2010    showing slightly enlarged left atrium with normal LV function and normal PA pressures.   . Chronic atrial fibrillation     CARDIOLOGIST- DR Elby Blackwelder-- LAST VISIT  12-16-2011 IN [facility]  . Acute medial meniscal injury of knee     RIGHT KNEE  . Asthmatic bronchitis , chronic   . OA (osteoarthritis) of knee     HANDS  . History of pneumonia     associated w/  rhabdomyolysis  OCT 2012  . COPD, moderate     PULMOLOGIST- DR YOUNG --  LAST VISIT NOTE 02-23-2012 IN [facility]  . Bruise     RIGHT ABD. DUE TO LOVENOX INJECTION  02-27-2012  . Chronic renal insufficiency   . Diabetes mellitus     INSULIN AND ORAL MEDS  . Non-ischemic cardiomyopathy     normal ef  . Atrial fibrillation   . Goiter 12/15    will have radiation in February    Past Surgical History  Procedure Laterality Date  . Breast enhancement surgery  1970    1990--- BREAST IMPLANTS REMOVED   .  Vaginal hysterectomy  1978  . Knee arthroscopy  03/02/2012    Procedure: ARTHROSCOPY KNEE;  Surgeon: Gearlean Alf, MD;  Location: [facility];  Service: Orthopedics;  Laterality: Right;  WITH DEBRIDEMENT of medial menisces     Current Outpatient Prescriptions  Medication Sig Dispense Refill  . apixaban (ELIQUIS) 5 MG TABS tablet Take 1 tablet (5 mg total) by mouth 2 (two) times daily. 60 tablet 5  . b complex vitamins capsule Take 1 capsule by mouth daily.     . calcium carbonate (OS-CAL) 600 MG TABS Take 600 mg by mouth daily.     . cholecalciferol (VITAMIN D) 1000 UNITS tablet Take 1,000 Units by  mouth daily.     . ferrous sulfate 325 (65 FE) MG tablet Take 325 mg by mouth daily with breakfast.    . furosemide (LASIX) 40 MG tablet TAKE 1 TABLET BY MOUTH ONCE DAILY 90 tablet 1  . glucose blood test strip as directed.    . insulin glargine (LANTUS) 100 UNIT/ML injection Inject 40 Units into the skin at bedtime.     [name] KLOR-CON M20 20 MEQ tablet TAKE 1 TABLET BY MOUTH ONCE DAILY 30 tablet 11  . levothyroxine (SYNTHROID, LEVOTHROID) 137 MCG tablet Take 137 mcg by mouth daily.    [name] losartan-hydrochlorothiazide (HYZAAR) 100-25 MG per tablet TAKE 1 TABLET BY MOUTH DAILY. 90 tablet 3  . Magnesium 200 MG TABS Take 200 mg by mouth daily.    . metFORMIN (GLUCOPHAGE) 1000 MG tablet Take by mouth. Take 1000mg  every morning and 500mg  every evening    . Multiple Vitamin (MULTIVITAMIN) tablet Take 1 tablet by mouth daily.     . Omega-3 Fatty Acids (FISH OIL PO) Take 1 tablet by mouth daily.     [name] PROAIR HFA 108 (90 BASE) MCG/ACT inhaler INHALE 2 PUFFS EVERY 4 HOURS AS NEEDED. 8.5 each 5  . rosuvastatin (CRESTOR) 5 MG tablet Take 1 tablet (5 mg total) by mouth daily. 30 tablet 11  . sitaGLIPtin (JANUVIA) 50 MG tablet Take 50 mg by mouth daily.    [name] SPIRIVA HANDIHALER 18 MCG inhalation capsule INHALE 1 CAPSULE VIA HANDIHALER EVERY MORNING 30 capsule 4   No current facility-administered medications for this visit.    Allergies:   Janumet; Amoxicillin; and Sulfonamide derivatives    Social History:  The patient  reports that she quit smoking about 19 years ago. Her smoking use included Cigarettes. She quit after 20 years of use. She has never used smokeless tobacco. She reports that she does not drink alcohol or use illicit drugs.   Family History:  The patient's family history includes Arrhythmia in her mother; Diabetes in her brother; Heart attack in her father; Heart disease in her brother; Heart failure in her mother; Stroke in her mother.    ROS:  Please see the history of present illness.    Otherwise, review of systems are positive for none.   All other systems are reviewed and negative.    PHYSICAL EXAM: VS:  BP 116/66 mmHg  Pulse 107  Ht 5\' 2"  (1.575 m)  Wt 194 lb 1.9 oz (88.052 kg)  BMI 35.50 kg/m2 , BMI Body mass index is 35.5 kg/(m^2). GEN: Well nourished, well developed, in no acute distress HEENT: normal Neck: no JVD, carotid bruits, or masses Cardiac: Irregularly irregular rhythm.; no murmurs, rubs, or gallops,no edema  Respiratory:  clear to auscultation bilaterally, normal work of breathing GI: soft, nontender, nondistended, + BS MS:  no deformity or atrophy Skin: warm and dry, no rash Neuro:  Strength and sensation are intact Psych: euthymic mood, full affect   EKG:  EKG is not ordered today.    Recent Labs: 05/08/2015: TSH 41.22* 07/08/2015: ALT 23; BUN 20; Creatinine, Ser 0.88; Hemoglobin 12.4; Platelets 268.0; Potassium 3.7; Sodium 141    Lipid Panel    Component Value Date/Time   CHOL 178 07/08/2015 1004   TRIG 91.0 07/08/2015 1004   HDL 64.10 07/08/2015 1004   CHOLHDL 3 07/08/2015 1004   VLDL 18.2 07/08/2015 1004   LDLCALC 95 07/08/2015 1004   LDLDIRECT 165.6 12/16/2011 0942      Wt Readings from Last 3 Encounters:  07/15/15 194 lb 1.9 oz (88.052 kg)  07/07/15 199 lb (90.266 kg)  03/11/15 195 lb 6.4 oz (88.633 kg)         ASSESSMENT AND PLAN:  1.1. permanent atrial fibrillation. We had a discussion regarding long-term anticoagulation. Her INRs have been somewhat labile. At this time we will switch her from warfarin to Eliquis 5 mg twice a day. 2. Hypercholesterolemia 3. essential hypertension without heart failure 4. diabetes mellitus followed by Dr. Buddy Duty 5. asthma followed by Dr. Annamaria Boots 6. Iron deficiency anemia. Recent colonoscopy and upper endoscopy showed no evidence of active bleeding  Disposition: we are checking blood work today.  Recheck here in 4 months for office visit CBC lipid panel hepatic function panel and  basal metabolic panel    Current medicines are reviewed at length with the patient today.  The patient does not have concerns regarding medicines.  The following changes have been made:  no change  Labs/ tests ordered today include:  No orders of the defined types were placed in this encounter.    Disposition: Continue current medication.  Recheck in 4 months for office visit CBC lipid panel hepatic function panel and basal metabolic panel  Signed, Darlin Coco MD 07/15/2015 7:02 PM    [facility] [place], [place], [place]  [address] Phone: [phone]; Fax: [phone]

## 2015-07-17 DIAGNOSIS — M160 Bilateral primary osteoarthritis of hip: Secondary | ICD-10-CM | POA: Diagnosis not present

## 2015-07-17 DIAGNOSIS — M1711 Unilateral primary osteoarthritis, right knee: Secondary | ICD-10-CM | POA: Diagnosis not present

## 2015-07-17 DIAGNOSIS — M19241 Secondary osteoarthritis, right hand: Secondary | ICD-10-CM | POA: Diagnosis not present

## 2015-07-21 ENCOUNTER — Encounter (HOSPITAL_COMMUNITY): Payer: Medicare Other

## 2015-07-28 ENCOUNTER — Encounter (HOSPITAL_COMMUNITY): Payer: Medicare Other

## 2015-08-04 ENCOUNTER — Encounter (HOSPITAL_COMMUNITY): Payer: Medicare Other

## 2015-08-05 DIAGNOSIS — H43811 Vitreous degeneration, right eye: Secondary | ICD-10-CM | POA: Diagnosis not present

## 2015-08-11 ENCOUNTER — Encounter (HOSPITAL_COMMUNITY): Payer: Medicare Other

## 2015-08-18 ENCOUNTER — Encounter (HOSPITAL_COMMUNITY): Payer: Medicare Other

## 2015-08-18 DIAGNOSIS — E0590 Thyrotoxicosis, unspecified without thyrotoxic crisis or storm: Secondary | ICD-10-CM | POA: Diagnosis not present

## 2015-08-19 DIAGNOSIS — H52203 Unspecified astigmatism, bilateral: Secondary | ICD-10-CM | POA: Diagnosis not present

## 2015-08-19 DIAGNOSIS — H2513 Age-related nuclear cataract, bilateral: Secondary | ICD-10-CM | POA: Diagnosis not present

## 2015-08-19 DIAGNOSIS — E119 Type 2 diabetes mellitus without complications: Secondary | ICD-10-CM | POA: Diagnosis not present

## 2015-08-25 ENCOUNTER — Encounter (HOSPITAL_COMMUNITY): Payer: Medicare Other

## 2015-08-30 ENCOUNTER — Encounter: Payer: Self-pay | Admitting: Radiology

## 2015-08-30 DIAGNOSIS — M19041 Primary osteoarthritis, right hand: Secondary | ICD-10-CM

## 2015-08-30 DIAGNOSIS — M19042 Primary osteoarthritis, left hand: Principal | ICD-10-CM

## 2015-08-30 DIAGNOSIS — R778 Other specified abnormalities of plasma proteins: Secondary | ICD-10-CM | POA: Insufficient documentation

## 2015-08-31 ENCOUNTER — Other Ambulatory Visit: Payer: Self-pay

## 2015-08-31 MED ORDER — POTASSIUM CHLORIDE CRYS ER 20 MEQ PO TBCR: 20.0000 meq | EXTENDED_RELEASE_TABLET | Freq: Every day | ORAL | Status: DC

## 2015-09-01 ENCOUNTER — Encounter (HOSPITAL_COMMUNITY): Payer: Medicare Other

## 2015-09-08 ENCOUNTER — Encounter (HOSPITAL_COMMUNITY): Payer: Medicare Other

## 2015-09-15 ENCOUNTER — Encounter (HOSPITAL_COMMUNITY): Payer: Medicare Other

## 2015-09-22 ENCOUNTER — Encounter (HOSPITAL_COMMUNITY): Payer: Medicare Other

## 2015-09-25 ENCOUNTER — Encounter: Payer: Self-pay | Admitting: Internal Medicine

## 2015-09-25 ENCOUNTER — Ambulatory Visit (INDEPENDENT_AMBULATORY_CARE_PROVIDER_SITE_OTHER): Payer: Medicare Other | Admitting: Internal Medicine

## 2015-09-25 VITALS — BP 130/76 | HR 80 | Ht 64.0 in | Wt 197.8 lb

## 2015-09-25 DIAGNOSIS — I482 Chronic atrial fibrillation, unspecified: Secondary | ICD-10-CM

## 2015-09-25 DIAGNOSIS — J449 Chronic obstructive pulmonary disease, unspecified: Secondary | ICD-10-CM | POA: Diagnosis not present

## 2015-09-25 DIAGNOSIS — J45909 Unspecified asthma, uncomplicated: Secondary | ICD-10-CM

## 2015-09-25 NOTE — Assessment & Plan Note (Signed)
Controlled ventricular response rate, managed by cardiology 

## 2015-09-25 NOTE — Assessment & Plan Note (Signed)
Mild small airways obstructive disease without acute exacerbation. She is getting adequate control for now with occasional use of rescue inhaler. I discussed criteria for resumption of a maintenance controller.

## 2015-09-25 NOTE — Patient Instructions (Signed)
Ok to continue occasional use of the albuterol rescue inhaler  Please call if we can help

## 2015-09-25 NOTE — Progress Notes (Signed)
09/01/11- 74 year old female former smoker followed for asthmatic bronchitis complicated by DM, CM, atrial fibrillation. Widow of Dr. Herbie Baltimore Transue/neurosurgeon. She continues to work part-time at [employer]. Has had flu vaccine. Has had pneumonia vaccine. Post hospital visit now after episode of syncope with rhabdomyolysis. She feels back to normal and is able to work and to get around with her usual activities. She now has a Medic alert device. Had renal insufficiency which is being tracked by Dr.Kerr, who follows up for diabetes. He has seen her and reports stable renal and liver function. She denies cough, wheeze, chest pain, palpitation or swelling. CXR 08/25/2011-mild CE, hyperinflation, bronchitis with some atelectasis left base.  10/27/11-74 year old female former smoker followed for asthmatic bronchitis complicated by DM, CM, atrial fibrillation. Widow of Dr. Herbie Baltimore Nier/neurosurgeon. She continues to work part-time at [employer]. Has had flu vaccine. Has had pneumonia vaccine. Malaise yesterday. Today woke w/ temp 101.4, cough productive green. Nose blowing clear. Scratchy throat. No muscle ache.  PFT-10/05/11- moderate COPD- FEV1/FVC0.61, DLCO 74%. She wants to re-do pulmonary rehab.  02/23/12- 74 year old female former smoker followed for asthmatic bronchitis complicated by DM, CM, atrial fibrillation. Widow of Dr. Herbie Baltimore Buschman/neurosurgeon. She continues to work part-time at [employer]. :"doing good overall"; recently had bronchitis-took Zpak and round of Prednisone and helped clear it up Not bothered by spring pollens. Satisfied with Spiriva and rare use of Proair.Back in Pulmonary Rehab and says it helps her.  For R knee arthroscopy with no pulmonary issues anticipated.  IMPRESSION: CXR 11/02/11 Stable. Emphysema without acute findings.  Original Report Authenticated By: ERIC A. MANSELL, M.D.   08/23/12- 74 year old female former smoker followed for asthmatic  bronchitis complicated by DM, CM, atrial fibrillation. Widow of Dr. Herbie Baltimore Botts/neurosurgeon Denies any flare ups; no SOB or wheezing as well as no cough or congestion. She says she is head "no issues at all". Has had flu vaccine. Continues pulmonary rehabilitation. She is pleased with her status.  09/05/13- 74 year old female former smoker followed for asthmatic bronchitis complicated by DM, CM, atrial fibrillation. Widow of Dr. Herbie Baltimore Arechiga/neurosurgeon FOLLOWS FOR:  Increased SOB x2 weeks.  Presently with sinus infection and possibly bronchitis. Dr Mare Ferrari gave Z-Pak to hold. She definitely caught this at work. Denies fever. Feels congestion still in head and chest. She wants to wait on Z-Pak until her Coumadin checked next week. Otherwise has done well. 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 Widow of Dr. Herbie Baltimore Vogel/neurosurgeon FOLLOWS FOR: Slight sinus issues with cough today; continues to have SOB with exertion as well.  Had flu vax. Continues Pulm Rehab- discussed. Keeps small supply prednisone, taking 5 mg very occasionally seems to control mild wheeze/ dyspnea. Uses inhalers prn.  CXR 09/05/13 FINDINGS: Borderline cardiomegaly, unchanged. Chronic peribronchial thickening. Pectus carinatum deformity. Lungs are hyperinflated consistent with emphysema. No acute osseous abnormality. No infiltrates or effusions. IMPRESSION: Emphysema and chronic peribronchial thickening. No acute abnormalities. Electronically Signed  By: Rozetta Nunnery M.D.  On: 09/05/2013 10:28  09/25/15- 74 year old female former smoker followed for asthmatic bronchitis complicated by DM, CM, atrial fibrillation. Widow of Dr. Herbie Baltimore Etcheverry/neurosurgeon FOLLOWS FOR: Pt states her breathing is well overall; slight wheezing and uses rescue Inahler  at times. She stopped using Spiriva finding it not cost-effective. Uses rescue inhaler 2-4 times a week. Not wheezing at night. CXR 09/23/2014 IMPRESSION: Enlargement of cardiac silhouette. Chronic bronchitic changes. No  acute abnormalities. Electronically Signed  By: Lavonia Dana M.D.  On: 09/23/2014 17:11  ROS-see HPI Constitutional:   No-   weight loss, night sweats, +fevers, chills, fatigue, lassitude. HEENT:   No-  headaches, difficulty swallowing, tooth/dental problems, sore throat,       No-  sneezing, itching, ear ache, +nasal congestion, post nasal drip,  CV:  No-   chest pain, orthopnea, PND, swelling in lower extremities, anasarca, dizziness, palpitations Resp:  +Some shortness of breath with exertion or at rest.              No-productive cough,  No non-productive cough,  No- coughing up of blood.              No-   change in color of mucus.  + wheezing.   Skin: No-   rash or lesions. GI:  No-   heartburn, indigestion, abdominal pain, nausea, vomiting,  GU: MS:  No-   joint pain or swelling.   Neuro-     nothing unusual Psych:  No- change in mood or affect. No depression or anxiety.  No memory loss.  OBJ General- Alert, Oriented, Affect-appropriate, Distress- none acute.  Skin- rash-none, lesions- none, excoriation- none; warm  Lymphadenopathy- none Head- atraumatic            Eyes- Gross vision intact, PERRLA, conjunctivae clear secretions            Ears- Hearing, canals-normal            Nose- Clear, no-Septal dev, mucus, polyps, erosion, perforation             Throat- Mallampati II , mucosa red , drainage- none, tonsils- atrophic Neck- flexible , trachea midline, no stridor , thyroid nl, carotid no bruit Chest - symmetrical excursion , unlabored           Heart/CV- + AF/ IRR , no murmur , no gallop  , no rub, nl s1 s2                           - JVD- none , edema- none, stasis changes- none, varices- none           Lung- + sounds  distant, wheeze-none,  unlabored, cough- none , dullness-none, rub- none           Chest wall-  Abd- Br/ Gen/ Rectal- Not done, not indicated Extrem- cyanosis- none, clubbing, none, atrophy- none, strength- nl Neuro- grossly intact to observation

## 2015-09-29 ENCOUNTER — Encounter (HOSPITAL_COMMUNITY): Payer: Medicare Other

## 2015-10-06 ENCOUNTER — Encounter (HOSPITAL_COMMUNITY): Payer: Medicare Other

## 2015-10-08 ENCOUNTER — Other Ambulatory Visit: Payer: Self-pay | Admitting: Cardiology

## 2015-10-09 ENCOUNTER — Other Ambulatory Visit: Payer: Self-pay | Admitting: Internal Medicine

## 2015-10-10 ENCOUNTER — Other Ambulatory Visit: Payer: Self-pay | Admitting: Cardiology

## 2015-10-13 ENCOUNTER — Encounter (HOSPITAL_COMMUNITY): Payer: Medicare Other

## 2015-10-20 ENCOUNTER — Encounter (HOSPITAL_COMMUNITY): Payer: Medicare Other

## 2015-10-21 ENCOUNTER — Ambulatory Visit (INDEPENDENT_AMBULATORY_CARE_PROVIDER_SITE_OTHER): Payer: Medicare Other | Admitting: *Deleted

## 2015-10-21 DIAGNOSIS — I4891 Unspecified atrial fibrillation: Secondary | ICD-10-CM | POA: Diagnosis not present

## 2015-10-21 DIAGNOSIS — R899 Unspecified abnormal finding in specimens from other organs, systems and tissues: Secondary | ICD-10-CM | POA: Diagnosis not present

## 2015-10-21 LAB — BASIC METABOLIC PANEL
BUN: 20 mg/dL (ref 7–25)
CHLORIDE: 104 mmol/L (ref 98–110)
CO2: 26 mmol/L (ref 20–31)
CREATININE: 0.85 mg/dL (ref 0.60–0.93)
Calcium: 10 mg/dL (ref 8.6–10.4)
GLUCOSE: 94 mg/dL (ref 65–99)
Potassium: 3.6 mmol/L (ref 3.5–5.3)
Sodium: 142 mmol/L (ref 135–146)

## 2015-10-21 LAB — CBC
HEMATOCRIT: 36.4 % (ref 36.0–46.0)
HEMOGLOBIN: 12.7 g/dL (ref 12.0–15.0)
MCH: 30.2 pg (ref 26.0–34.0)
MCHC: 34.9 g/dL (ref 30.0–36.0)
MCV: 86.5 fL (ref 78.0–100.0)
MPV: 9.5 fL (ref 8.6–12.4)
Platelets: 297 10*3/uL (ref 150–400)
RBC: 4.21 MIL/uL (ref 3.87–5.11)
RDW: 14.6 % (ref 11.5–15.5)
WBC: 6.3 10*3/uL (ref 4.0–10.5)

## 2015-10-21 NOTE — Progress Notes (Signed)
Pt was started on Eliquis 5 mg twice a day for AFIB on Mar 11, 2015.    Reviewed patients medication list.  Pt is not currently on any combined P-gp and strong CYP3A4 inhibitors/inducers (ketoconazole, traconazole, ritonavir, carbamazepine, phenytoin, rifampin, St. John's wort).  Reviewed labs: SCr-0.85, Hgb-12.7, HCT-36.4, and Weight-89.9kg. Dose appropriate based on dosing criteria.  Hgb and HCT Within Normal Limits  A full discussion of the nature of anticoagulants has been carried out.  A benefit/risk analysis has been presented to the patient, so that they understand the justification for choosing anticoagulation with Eliquis at this time.  The need for compliance is stressed.  Pt is aware to take the medication twice daily.  Side effects of potential bleeding are discussed, including unusual colored urine or stools, coughing up blood or coffee ground emesis, nose bleeds or serious fall or head trauma.  Discussed signs and symptoms of stroke. The patient should avoid any OTC items containing aspirin or ibuprofen.  Avoid alcohol consumption.   Call if any signs of abnormal bleeding.  Discussed financial obligations and resolved any difficulty in obtaining medication.  10/22/2015 Spoke with pt and instructed that her labs are within normal limits and with age and wt do not need to be continued to follow at coumadin clinic just reminded to have cbc and bmet done every 6 months with her physician as long as on Eliquis and she states understanding  Elbert Ewings RN

## 2015-10-27 ENCOUNTER — Encounter (HOSPITAL_COMMUNITY): Payer: Medicare Other

## 2015-10-28 DIAGNOSIS — Z862 Personal history of diseases of the blood and blood-forming organs and certain disorders involving the immune mechanism: Secondary | ICD-10-CM | POA: Diagnosis not present

## 2015-10-28 DIAGNOSIS — M1990 Unspecified osteoarthritis, unspecified site: Secondary | ICD-10-CM | POA: Diagnosis not present

## 2015-10-28 DIAGNOSIS — Z794 Long term (current) use of insulin: Secondary | ICD-10-CM | POA: Diagnosis not present

## 2015-10-28 DIAGNOSIS — E0520 Thyrotoxicosis with toxic multinodular goiter without thyrotoxic crisis or storm: Secondary | ICD-10-CM | POA: Diagnosis not present

## 2015-10-28 DIAGNOSIS — Z79899 Other long term (current) drug therapy: Secondary | ICD-10-CM | POA: Diagnosis not present

## 2015-10-28 DIAGNOSIS — E1142 Type 2 diabetes mellitus with diabetic polyneuropathy: Secondary | ICD-10-CM | POA: Diagnosis not present

## 2015-10-28 DIAGNOSIS — E890 Postprocedural hypothyroidism: Secondary | ICD-10-CM | POA: Diagnosis not present

## 2015-11-03 ENCOUNTER — Encounter (HOSPITAL_COMMUNITY): Payer: Medicare Other

## 2015-11-10 ENCOUNTER — Encounter (HOSPITAL_COMMUNITY): Payer: Medicare Other

## 2015-11-17 ENCOUNTER — Encounter (HOSPITAL_COMMUNITY): Payer: Medicare Other

## 2015-11-24 ENCOUNTER — Encounter (HOSPITAL_COMMUNITY): Payer: Medicare Other

## 2015-12-01 ENCOUNTER — Encounter (HOSPITAL_COMMUNITY): Payer: Medicare Other

## 2015-12-02 ENCOUNTER — Ambulatory Visit (INDEPENDENT_AMBULATORY_CARE_PROVIDER_SITE_OTHER): Payer: Medicare Other | Admitting: Cardiology

## 2015-12-02 ENCOUNTER — Encounter: Payer: Self-pay | Admitting: Cardiology

## 2015-12-02 VITALS — BP 126/78 | HR 84 | Ht 63.0 in | Wt 194.2 lb

## 2015-12-02 DIAGNOSIS — E7800 Pure hypercholesterolemia, unspecified: Secondary | ICD-10-CM

## 2015-12-02 DIAGNOSIS — I119 Hypertensive heart disease without heart failure: Secondary | ICD-10-CM | POA: Diagnosis not present

## 2015-12-02 DIAGNOSIS — I482 Chronic atrial fibrillation, unspecified: Secondary | ICD-10-CM

## 2015-12-02 LAB — BASIC METABOLIC PANEL
BUN: 22 mg/dL (ref 7–25)
CO2: 29 mmol/L (ref 20–31)
CREATININE: 0.82 mg/dL (ref 0.60–0.93)
Calcium: 9.7 mg/dL (ref 8.6–10.4)
Chloride: 103 mmol/L (ref 98–110)
GLUCOSE: 159 mg/dL — AB (ref 65–99)
Potassium: 3.6 mmol/L (ref 3.5–5.3)
Sodium: 141 mmol/L (ref 135–146)

## 2015-12-02 LAB — HEPATIC FUNCTION PANEL
ALBUMIN: 4.1 g/dL (ref 3.6–5.1)
ALT: 25 U/L (ref 6–29)
AST: 23 U/L (ref 10–35)
Alkaline Phosphatase: 75 U/L (ref 33–130)
BILIRUBIN DIRECT: 0.1 mg/dL (ref <=0.2)
Indirect Bilirubin: 0.3 mg/dL (ref 0.2–1.2)
Total Bilirubin: 0.4 mg/dL (ref 0.2–1.2)
Total Protein: 7.3 g/dL (ref 6.1–8.1)

## 2015-12-02 LAB — CBC WITH DIFFERENTIAL/PLATELET
BASOS ABS: 0 10*3/uL (ref 0.0–0.1)
Basophils Relative: 0 % (ref 0–1)
Eosinophils Absolute: 0.1 10*3/uL (ref 0.0–0.7)
Eosinophils Relative: 2 % (ref 0–5)
HEMATOCRIT: 40.2 % (ref 36.0–46.0)
HEMOGLOBIN: 13.1 g/dL (ref 12.0–15.0)
LYMPHS ABS: 1.2 10*3/uL (ref 0.7–4.0)
LYMPHS PCT: 24 % (ref 12–46)
MCH: 29.2 pg (ref 26.0–34.0)
MCHC: 32.6 g/dL (ref 30.0–36.0)
MCV: 89.7 fL (ref 78.0–100.0)
MONOS PCT: 11 % (ref 3–12)
MPV: 9.9 fL (ref 8.6–12.4)
Monocytes Absolute: 0.6 10*3/uL (ref 0.1–1.0)
NEUTROS PCT: 63 % (ref 43–77)
Neutro Abs: 3.2 10*3/uL (ref 1.7–7.7)
Platelets: 276 10*3/uL (ref 150–400)
RBC: 4.48 MIL/uL (ref 3.87–5.11)
RDW: 13.9 % (ref 11.5–15.5)
WBC: 5.1 10*3/uL (ref 4.0–10.5)

## 2015-12-02 LAB — LIPID PANEL
CHOLESTEROL: 181 mg/dL (ref 125–200)
HDL: 58 mg/dL (ref >=46)
LDL Cholesterol: 105 mg/dL (ref <130)
TRIGLYCERIDES: 91 mg/dL (ref <150)
Total CHOL/HDL Ratio: 3.1 Ratio (ref <=5.0)
VLDL: 18 mg/dL (ref <30)

## 2015-12-02 NOTE — Progress Notes (Signed)
Quick Note:  Please report to patient. The recent labs are stable. Continue same medication and careful diet. ______ 

## 2015-12-02 NOTE — Progress Notes (Signed)
Cardiology Office Note   Date:  12/02/2015   ID:  Renee Ray, DOB 25-Aug-1941, MRN [id number]  PCP:  [name], MD  Cardiologist: Darlin Coco MD  Chief Complaint  Patient presents with  . routine follow up    Patient denies chest pain, shortness of breath, le edema, or claudication       History of Present Illness: Renee Ray is a 75 y.o. female who presents for  Four-month follow-up visit  . She has a history of diabetes. Her diabetes is followed by Dr. Buddy Duty. She has a history of high blood pressure and a history of established chronic atrial fibrillation. She had a remote attempt at cardioversion which was unsuccessful. She has not been having any symptoms from her atrial fibrillation. He also has a history of asthma and is followed by pulmonary , Dr. Tarri Fuller young. . She had an echocardiogram in 03/12/14 which showed normal ejection fraction of 60-65% and there was diastolic dysfunction. She previously was on warfarin.  She is now on Apixaban. Tolerating it well. Recently she had some blood work at her PCP office and was found to have anemia. Workup of this showed that it was an iron deficiency anemia. She was told at that time that she would probably need to have a GI workup. She has not been aware of any hematochezia or melena. She was placed empirically on oral iron therapy. She has not been taking the iron tablets on a consistent basis because of side effects of constipation. She has felt more fatigued and due to her anemia. Since we last saw her she had upper endoscopy and colonoscopy and no evidence of active bleeding was found. She was found to be underactive in terms of her thyroid and Dr. Buddy Duty has increased her Synthroid dose. This may have been affecting her bowel function and constipation.  Her diabetes has been reasonably well controlled with most recent hemoglobin A1c of 7.0. Dr. Buddy Duty follows her for her diabetes. She has not been having any  racing of her heart. No chest pain or increased shortness of breath.   Past Medical History  Diagnosis Date  . Hypertension   . Hyperlipidemia   . Chronic anticoagulation     coumadin  . Normal nuclear stress test 2008  . Abnormal echocardiogram 2010    showing slightly enlarged left atrium with normal LV function and normal PA pressures.   . Chronic atrial fibrillation ([place])     CARDIOLOGIST- DR Tranae Laramie-- LAST VISIT  12-16-2011 IN EPIC  . Acute medial meniscal injury of knee     RIGHT KNEE  . Asthmatic bronchitis , chronic ([place])   . OA (osteoarthritis) of knee     HANDS  . History of pneumonia     associated w/  rhabdomyolysis  OCT 2012  . COPD, moderate ([place])     PULMOLOGIST- DR YOUNG --  LAST VISIT NOTE 02-23-2012 IN EPIC  . Bruise     RIGHT ABD. DUE TO LOVENOX INJECTION  02-27-2012  . Chronic renal insufficiency   . Diabetes mellitus     INSULIN AND ORAL MEDS  . Non-ischemic cardiomyopathy ([place])     normal ef  . Atrial fibrillation ([place])   . Goiter 12/15    will have radiation in February    Past Surgical History  Procedure Laterality Date  . Breast enhancement surgery  1970    1990--- BREAST IMPLANTS REMOVED   . Vaginal hysterectomy  1978  . Knee  arthroscopy  03/02/2012    Procedure: ARTHROSCOPY KNEE;  Surgeon: Gearlean Alf, MD;  Location: [facility];  Service: Orthopedics;  Laterality: Right;  WITH DEBRIDEMENT of medial menisces     Current Outpatient Prescriptions  Medication Sig Dispense Refill  . b complex vitamins capsule Take 1 capsule by mouth daily.     . BD PEN NEEDLE NANO U/F 32G X 4 MM MISC See admin instructions.  3  . calcium carbonate (OS-CAL) 600 MG TABS Take 600 mg by mouth daily.     . cholecalciferol (VITAMIN D) 1000 UNITS tablet Take 1,000 Units by mouth daily.     [name] ELIQUIS 5 MG TABS tablet TAKE 1 TABLET (5 MG TOTAL) BY MOUTH 2 (TWO) TIMES DAILY. 60 tablet 11  . ferrous sulfate 325 (65 FE) MG tablet Take 325 mg by  mouth daily with breakfast.    . furosemide (LASIX) 40 MG tablet TAKE 1 TABLET BY MOUTH ONCE DAILY 90 tablet 3  . glucose blood test strip as directed.    . insulin glargine (LANTUS) 100 UNIT/ML injection Inject 40 Units into the skin at bedtime.     [name] LANTUS SOLOSTAR 100 UNIT/ML Solostar Pen Inject 50 Units into the skin at bedtime.     [name] levothyroxine (SYNTHROID, LEVOTHROID) 175 MCG tablet Take 175 mcg by mouth daily before breakfast.  5  . losartan-hydrochlorothiazide (HYZAAR) 100-25 MG per tablet TAKE 1 TABLET BY MOUTH DAILY. 90 tablet 3  . Magnesium 200 MG TABS Take 200 mg by mouth daily.    . metFORMIN (GLUCOPHAGE-XR) 500 MG 24 hr tablet Take 4 tablets by mouth every evening. Take with evening meal    . Multiple Vitamin (MULTIVITAMIN) tablet Take 1 tablet by mouth daily.     . Omega-3 Fatty Acids (FISH OIL PO) Take 1 tablet by mouth daily.     . potassium chloride SA (KLOR-CON M20) 20 MEQ tablet Take 1 tablet (20 mEq total) by mouth daily. 30 tablet 11  . PROAIR HFA 108 (90 BASE) MCG/ACT inhaler INHALE 2 PUFFS EVERY 4 HOURS AS NEEDED. 8.5 Inhaler 5  . rosuvastatin (CRESTOR) 5 MG tablet Take 1 tablet (5 mg total) by mouth daily. 30 tablet 11   No current facility-administered medications for this visit.    Allergies:   Janumet; Amoxicillin; and Sulfonamide derivatives    Social History:  The patient  reports that she quit smoking about 19 years ago. Her smoking use included Cigarettes. She quit after 20 years of use. She has never used smokeless tobacco. She reports that she does not drink alcohol or use illicit drugs.   Family History:  The patient's family history includes Arrhythmia in her mother; Diabetes in her brother; Heart attack in her father; Heart disease in her brother; Heart failure in her mother; Stroke in her mother.    ROS:  Please see the history of present illness.   Otherwise, review of systems are positive for none.   All other systems are reviewed and negative.     PHYSICAL EXAM: VS:  BP 126/78 mmHg  Pulse 84  Ht 5\' 3"  (1.6 m)  Wt 194 lb 3.2 oz (88.089 kg)  BMI 34.41 kg/m2 , BMI Body mass index is 34.41 kg/(m^2). GEN: Well nourished, well developed, in no acute distress HEENT: normal Neck: no JVD, carotid bruits, or masses Cardiac: atrial fibrillation with irregular ventricular response.; no murmurs, rubs, or gallops,no edema  Respiratory:  clear to auscultation bilaterally, normal work of breathing  GI: soft, nontender, nondistended, + BS MS: no deformity or atrophy Skin: warm and dry, no rash Neuro:  Strength and sensation are intact Psych: euthymic mood, full affect   EKG:  EKG is ordered today. The ekg ordered today demonstrates  Atrial fibrillation with controlled ventricular response at 84 bpm. Nonspecific ST-T wave changes.   Recent Labs: 05/08/2015: TSH 41.22* 07/08/2015: ALT 23 10/21/2015: BUN 20; Creat 0.85; Hemoglobin 12.7; Platelets 297; Potassium 3.6; Sodium 142    Lipid Panel    Component Value Date/Time   CHOL 178 07/08/2015 1004   TRIG 91.0 07/08/2015 1004   HDL 64.10 07/08/2015 1004   CHOLHDL 3 07/08/2015 1004   VLDL 18.2 07/08/2015 1004   LDLCALC 95 07/08/2015 1004   LDLDIRECT 165.6 12/16/2011 0942      Wt Readings from Last 3 Encounters:  12/02/15 194 lb 3.2 oz (88.089 kg)  09/25/15 197 lb 12.8 oz (89.721 kg)  07/15/15 194 lb 1.9 oz (88.052 kg)         ASSESSMENT AND PLAN:  1.1. permanent atrial fibrillation. continue strategy of rate control and anticoagulation with Apixaban 2. Hypercholesterolemia 3. essential hypertension without heart failure 4. diabetes mellitus followed by Dr. Buddy Duty 5. asthma followed by Dr. Annamaria Boots 6. Iron deficiency anemia. Recent colonoscopy and upper endoscopy showed no evidence of active bleeding  Disposition: we are checking blood work today.  Recheck here in 4 months for office visit . After my retirement she will be followed from a cardiac standpoint by Dr.  Sallyanne Kuster  Current medicines are reviewed at length with the patient today.  The patient does not have concerns regarding medicines.  The following changes have been made:  no change  Labs/ tests ordered today include:   Orders Placed This Encounter  Procedures  . Hepatic function panel  . Lipid panel  . CBC with Differential/Platelet  . Basic metabolic panel  . EKG 12-Lead      Signed, Darlin Coco MD 12/02/2015 1:13 PM    [facility] [address], [place], [place]  [address] Phone: [phone]; Fax: [phone]

## 2015-12-02 NOTE — Patient Instructions (Signed)
Medication Instructions:  Your physician recommends that you continue on your current medications as directed. Please refer to the Current Medication list given to you today.   Labwork: Your physician recommends that you return for lab work in: TODAY (lipid/liver/bmet/cbc)   Testing/Procedures: none  Follow-Up: Your physician recommends that you schedule a follow-up appointment in: 4 months with Dr. Sallyanne Kuster.    Any Other Special Instructions Will Be Listed Below (If Applicable).     If you need a refill on your cardiac medications before your next appointment, please call your pharmacy.

## 2015-12-08 ENCOUNTER — Encounter (HOSPITAL_COMMUNITY): Payer: Medicare Other

## 2015-12-15 ENCOUNTER — Ambulatory Visit (INDEPENDENT_AMBULATORY_CARE_PROVIDER_SITE_OTHER): Payer: Medicare Other | Admitting: Podiatry

## 2015-12-15 ENCOUNTER — Encounter (HOSPITAL_COMMUNITY): Payer: Medicare Other

## 2015-12-15 ENCOUNTER — Encounter: Payer: Self-pay | Admitting: Podiatry

## 2015-12-15 DIAGNOSIS — E119 Type 2 diabetes mellitus without complications: Secondary | ICD-10-CM

## 2015-12-15 DIAGNOSIS — Q828 Other specified congenital malformations of skin: Secondary | ICD-10-CM

## 2015-12-15 DIAGNOSIS — M2041 Other hammer toe(s) (acquired), right foot: Secondary | ICD-10-CM

## 2015-12-15 NOTE — Progress Notes (Signed)
She presents today with a chief complaint of a painful corn to the fifth digit of the right foot.   Objective: Vital signs are stable she is alert and oriented. Pulses are strongly palpable. Adductor varus rotated hammertoe deformity fifth right resulting in juxtaposition and irritation with callus and corn with porokeratotic lesion medial aspect of the fifth digit right foot overlying the distal phalanx. No open lesions or wounds.  Assessment: Pain in limb secondary to adductovarus rotated hammertoe deformity and porokeratosis.  Plan: Debrided the porokeratotic lesion follow up with me as needed.

## 2015-12-22 ENCOUNTER — Encounter (HOSPITAL_COMMUNITY): Payer: Medicare Other

## 2015-12-29 ENCOUNTER — Encounter (HOSPITAL_COMMUNITY): Payer: Medicare Other

## 2016-01-05 ENCOUNTER — Encounter (HOSPITAL_COMMUNITY): Payer: Medicare Other

## 2016-01-12 ENCOUNTER — Ambulatory Visit (INDEPENDENT_AMBULATORY_CARE_PROVIDER_SITE_OTHER): Payer: Medicare Other | Admitting: Podiatry

## 2016-01-12 ENCOUNTER — Encounter: Payer: Self-pay | Admitting: Podiatry

## 2016-01-12 ENCOUNTER — Encounter (HOSPITAL_COMMUNITY): Payer: Medicare Other

## 2016-01-12 VITALS — BP 128/62 | HR 90 | Resp 16

## 2016-01-12 DIAGNOSIS — M2041 Other hammer toe(s) (acquired), right foot: Secondary | ICD-10-CM

## 2016-01-12 DIAGNOSIS — Q828 Other specified congenital malformations of skin: Secondary | ICD-10-CM | POA: Diagnosis not present

## 2016-01-13 NOTE — Progress Notes (Signed)
She presents today with a chief complaint of a painful fifth toe left foot with a corn.  Objective: Vital signs are stable she is alert and oriented 3. She has a reactive hyperkeratosis to the medial aspect of the fifth toe left foot with surrounding erythema. Once debrided does not demonstrate any signs of infection or purulence and no malodor.  Assessment: Porokeratosis fifth left.  Plan: I debrided the area today placed padding and will follow up with her in 1 month.

## 2016-02-11 ENCOUNTER — Encounter: Payer: Self-pay | Admitting: Podiatry

## 2016-02-11 ENCOUNTER — Ambulatory Visit (INDEPENDENT_AMBULATORY_CARE_PROVIDER_SITE_OTHER): Payer: Medicare Other | Admitting: Podiatry

## 2016-02-11 DIAGNOSIS — M2041 Other hammer toe(s) (acquired), right foot: Secondary | ICD-10-CM

## 2016-02-11 DIAGNOSIS — Q828 Other specified congenital malformations of skin: Secondary | ICD-10-CM | POA: Diagnosis not present

## 2016-02-11 NOTE — Progress Notes (Signed)
She presents today for follow-up of her painful corn fifth digit of the right foot medial aspect. She states that it seems to be doing better than it has been in the past since I been keeping the toe separated.  Objective: Vital signs are stable pulses are palpable. She has a corn to the distal medial aspect of the hammertoe deformity right fifth toe.  Assessment: Hammertoe deformity fifth right with corn to the medial aspect of the fifth toe.  Plan: Debridement of the reactive hyperkeratosis today and placed padding. Follow up with her in 1-2 months.

## 2016-03-10 ENCOUNTER — Encounter: Payer: Self-pay | Admitting: Podiatry

## 2016-03-10 ENCOUNTER — Ambulatory Visit (INDEPENDENT_AMBULATORY_CARE_PROVIDER_SITE_OTHER): Payer: Medicare Other | Admitting: Podiatry

## 2016-03-10 DIAGNOSIS — E119 Type 2 diabetes mellitus without complications: Secondary | ICD-10-CM | POA: Diagnosis not present

## 2016-03-10 DIAGNOSIS — M2041 Other hammer toe(s) (acquired), right foot: Secondary | ICD-10-CM

## 2016-03-10 DIAGNOSIS — Q828 Other specified congenital malformations of skin: Secondary | ICD-10-CM

## 2016-03-12 NOTE — Progress Notes (Signed)
She presents today for a chief complaint of a painful porokeratosis to the medial aspect of the fifth digit of the right foot. She states that it seemed to worsen more quickly this time.  Objective: Vital signs are stable she is alert and oriented 3. Pulses are palpable. Neurologic sensorium is intact bilateral and deep tendon reflexes are intact. Muscle strength is normal bilateral. Orthopedic evaluation demonstrates an adductovarus rotated hammertoe deformity fifth right with under lapping of the fifth toe beneath the fourth. This has resulted in a reactive hyperkeratotic/porokeratotic lesion medial aspect distal phalanx fifth digit right foot. No signs of infection no erythema cellulitis drainage or odor.  Assessment: Porokeratosis associated with hammertoe deformity.  Plan: Discussed etiology pathology conservative versus surgical therapies. Debrided the reactive hyperkeratotic lesion today with out iatrogenic lesion and placed a Band-Aid. Follow up with her in 1 month.

## 2016-03-22 ENCOUNTER — Other Ambulatory Visit: Payer: Self-pay

## 2016-03-22 MED ORDER — ROSUVASTATIN CALCIUM 5 MG PO TABS: 5.0000 mg | ORAL_TABLET | Freq: Every day | ORAL | Status: DC

## 2016-03-22 NOTE — Telephone Encounter (Signed)
REFILL 

## 2016-03-28 ENCOUNTER — Ambulatory Visit (INDEPENDENT_AMBULATORY_CARE_PROVIDER_SITE_OTHER): Payer: Medicare Other | Admitting: Cardiovascular Disease

## 2016-03-28 ENCOUNTER — Encounter: Payer: Self-pay | Admitting: Cardiovascular Disease

## 2016-03-28 VITALS — BP 138/82 | HR 74 | Ht 63.0 in | Wt 188.8 lb

## 2016-03-28 DIAGNOSIS — I482 Chronic atrial fibrillation, unspecified: Secondary | ICD-10-CM

## 2016-03-28 DIAGNOSIS — E785 Hyperlipidemia, unspecified: Secondary | ICD-10-CM

## 2016-03-28 DIAGNOSIS — Z79899 Other long term (current) drug therapy: Secondary | ICD-10-CM

## 2016-03-28 DIAGNOSIS — D509 Iron deficiency anemia, unspecified: Secondary | ICD-10-CM

## 2016-03-28 DIAGNOSIS — I119 Hypertensive heart disease without heart failure: Secondary | ICD-10-CM

## 2016-03-28 DIAGNOSIS — Z7901 Long term (current) use of anticoagulants: Secondary | ICD-10-CM

## 2016-03-28 NOTE — Patient Instructions (Signed)
Dr Sallyanne Kuster recommends that you continue on your current medications as directed. Please refer to the Current Medication list given to you today.  Your physician recommends that you return for lab work in 6 months - FASTING. Please have your lab work done 5-7 days prior to your appointment.  Dr Sallyanne Kuster recommends that you schedule a follow-up appointment in 6 months. You will receive a reminder letter in the mail two months in advance. If you don't receive a letter, please call our office to schedule the follow-up appointment.  If you need a refill on your cardiac medications before your next appointment, please call your pharmacy.

## 2016-03-28 NOTE — Progress Notes (Signed)
Patient ID: Renee Ray, female   DOB: Aug 22, 1941, 75 y.o.   MRN: [id number]    Cardiology Office Note    Date:  03/28/2016   ID:  Renee Ray, DOB 1941-03-16, MRN [id number]  PCP:  [name], MD  Cardiologist:   Sanda Klein, MD   Chief Complaint  Patient presents with  . New Evaluation    former Dr. Mare Ferrari patient    History of Present Illness:  Renee Ray is a 75 y.o. female with long-standing persistent atrial fibrillation, previously cared for by Dr. Warren Danes, until his recent retirement. She is here to establish cardiology follow-up.  She has asymptomatic atrial fibrillation that is well rate controlled without the need for AV blocking medications. She is on chronic anticoagulation with Eliquis, without any serious bleeding complications.  She did have iron deficiency anemia but an extensive endoscopic workup by Dr. Olevia Perches (EGD, capsule enteroscopy, colonoscopy) was negative. She takes iron supplements. She has well-controlled diabetes mellitus, followed by Dr. Buddy Duty. She has treated hypertension with some evidence of left ventricular hypertrophy, but without overt heart failure. She does take a low dose of diuretic daily for lower extremity edema.  She exercises twice weekly without any complaints of shortness of breath, dizziness, syncope. She has not had any overt serious bleeding problems. She denies leg edema or focal neurological events.  Past Medical History  Diagnosis Date  . Hypertension   . Hyperlipidemia   . Chronic anticoagulation     coumadin  . Normal nuclear stress test 2008  . Abnormal echocardiogram 2010    showing slightly enlarged left atrium with normal LV function and normal PA pressures.   . Chronic atrial fibrillation ([place])     CARDIOLOGIST- DR BRACKBILL-- LAST VISIT  12-16-2011 IN EPIC  . Acute medial meniscal injury of knee     RIGHT KNEE  . Asthmatic bronchitis , chronic ([place])   . OA (osteoarthritis) of knee     HANDS    . History of pneumonia     associated w/  rhabdomyolysis  OCT 2012  . COPD, moderate ([place])     PULMOLOGIST- DR YOUNG --  LAST VISIT NOTE 02-23-2012 IN EPIC  . Bruise     RIGHT ABD. DUE TO LOVENOX INJECTION  02-27-2012  . Chronic renal insufficiency   . Diabetes mellitus     INSULIN AND ORAL MEDS  . Non-ischemic cardiomyopathy ([place])     normal ef  . Atrial fibrillation ([place])   . Goiter 12/15    will have radiation in February    Past Surgical History  Procedure Laterality Date  . Breast enhancement surgery  1970    1990--- BREAST IMPLANTS REMOVED   . Vaginal hysterectomy  1978  . Knee arthroscopy  03/02/2012    Procedure: ARTHROSCOPY KNEE;  Surgeon: Gearlean Alf, MD;  Location: [facility];  Service: Orthopedics;  Laterality: Right;  WITH DEBRIDEMENT of medial menisces    Current Medications: Outpatient Prescriptions Prior to Visit  Medication Sig Dispense Refill  . b complex vitamins capsule Take 1 capsule by mouth daily.     . BD PEN NEEDLE NANO U/F 32G X 4 MM MISC See admin instructions.  3  . calcium carbonate (OS-CAL) 600 MG TABS Take 600 mg by mouth daily.     . cholecalciferol (VITAMIN D) 1000 UNITS tablet Take 1,000 Units by mouth daily.     [name] ELIQUIS 5 MG TABS tablet TAKE 1 TABLET (5 MG TOTAL)  BY MOUTH 2 (TWO) TIMES DAILY. 60 tablet 11  . ferrous sulfate 325 (65 FE) MG tablet Take 325 mg by mouth daily with breakfast.    . furosemide (LASIX) 40 MG tablet TAKE 1 TABLET BY MOUTH ONCE DAILY 90 tablet 3  . glucose blood test strip as directed.    . insulin glargine (LANTUS) 100 UNIT/ML injection Inject 40 Units into the skin at bedtime.     [name] LANTUS SOLOSTAR 100 UNIT/ML Solostar Pen Inject 50 Units into the skin at bedtime.     [name] levothyroxine (SYNTHROID, LEVOTHROID) 175 MCG tablet Take 175 mcg by mouth daily before breakfast.  5  . losartan-hydrochlorothiazide (HYZAAR) 100-25 MG per tablet TAKE 1 TABLET BY MOUTH DAILY. 90 tablet 3  . Magnesium 200 MG  TABS Take 200 mg by mouth daily.    . metFORMIN (GLUCOPHAGE-XR) 500 MG 24 hr tablet Take 4 tablets by mouth every evening. Take with evening meal    . Multiple Vitamin (MULTIVITAMIN) tablet Take 1 tablet by mouth daily.     . Omega-3 Fatty Acids (FISH OIL PO) Take 1 tablet by mouth daily.     . potassium chloride SA (KLOR-CON M20) 20 MEQ tablet Take 1 tablet (20 mEq total) by mouth daily. 30 tablet 11  . PROAIR HFA 108 (90 BASE) MCG/ACT inhaler INHALE 2 PUFFS EVERY 4 HOURS AS NEEDED. 8.5 Inhaler 5  . rosuvastatin (CRESTOR) 5 MG tablet Take 1 tablet (5 mg total) by mouth daily. 30 tablet 0   No facility-administered medications prior to visit.     Allergies:   Janumet; Amoxicillin; and Sulfonamide derivatives   Social History   Social History  . Marital Status: Widowed    Spouse Name: N/A  . Number of Children: 1  . Years of Education: N/A   Occupational History  .      Part-Time at [employer]   Social History Main Topics  . Smoking status: Former Smoker -- 20 years    Types: Cigarettes    Quit date: 05/05/1996  . Smokeless tobacco: Never Used  . Alcohol Use: No  . Drug Use: No  . Sexual Activity: No   Other Topics Concern  . None   Social History Narrative     Family History:  The patient's family history includes Arrhythmia in her mother; Diabetes in her brother; Heart attack in her father; Heart disease in her brother; Heart failure in her mother; Stroke in her mother.   ROS:   Please see the history of present illness.    ROS All other systems reviewed and are negative.   PHYSICAL EXAM:   VS:  BP 138/82 mmHg  Pulse 74  Ht 5\' 3"  (1.6 m)  Wt 85.639 kg (188 lb 12.8 oz)  BMI 33.45 kg/m2  SpO2 97%   GEN: Well nourished, well developed, in no acute distress HEENT: normal Neck: no JVD, carotid bruits, or masses Cardiac: irregular; no murmurs, rubs, or gallops,no edema  Respiratory:  clear to auscultation bilaterally, normal work of breathing GI: soft,  nontender, nondistended, + BS MS: no deformity or atrophy Skin: warm and dry, no rash Neuro:  Alert and Oriented x 3, Strength and sensation are intact Psych: euthymic mood, full affect  Wt Readings from Last 3 Encounters:  03/28/16 85.639 kg (188 lb 12.8 oz)  12/02/15 88.089 kg (194 lb 3.2 oz)  09/25/15 89.721 kg (197 lb 12.8 oz)      Studies/Labs Reviewed:   EKG:  EKG is ordered  today.  The ekg ordered today demonstrates Atrial fibrillation with controlled rate, incomplete right bundle branch block (QRS 100 ms), QTC 468 ms  Recent Labs: 05/08/2015: TSH 41.22* 12/02/2015: ALT 25; BUN 22; Creat 0.82; Hemoglobin 13.1; Platelets 276; Potassium 3.6; Sodium 141   Lipid Panel    Component Value Date/Time   CHOL 181 12/02/2015 1056   TRIG 91 12/02/2015 1056   HDL 58 12/02/2015 1056   CHOLHDL 3.1 12/02/2015 1056   VLDL 18 12/02/2015 1056   LDLCALC 105 12/02/2015 1056   LDLDIRECT 165.6 12/16/2011 0942    Additional studies/ records that were reviewed today include:  Dr. Sherryl Barters last 3 notes    ASSESSMENT:    1. Chronic atrial fibrillation ([place])   2. Benign hypertensive heart disease without heart failure   3. Dyslipidemia   4. Anemia, iron deficiency   5. Chronic anticoagulation   6. Medication management      PLAN:  In order of problems listed above:  1. AFIB, permanent: Asymptomatic, on appropriate anticoagulation. CHADSVasc 5 (age 30, HTN, DM, gender). She does not require any medication for rate control, suggesting some degree of cardiac conduction system disease 2. HTN: The pressure is well controlled. Echo in 2015 showed mild left ventricular hypertrophy and normal left ventricular systolic function. The report was interpreted as showing pseudo-normal filling, but I think she was in atrial fibrillation when that echo was performed. 3. HLP: In the absence of known vascular disease, target LDL less than 100 mg/deciliter, close to that goal when checked in  January 4. Anemia: Etiology of iron deficiency is unclear after a very benign extensive GI workup. Continue iron supplement 5. Anticoagulation is otherwise well tolerated without complications. 6. Recheck renal function, liver function tests, hemoglobin periodically    Medication Adjustments/Labs and Tests Ordered: Current medicines are reviewed at length with the patient today.  Concerns regarding medicines are outlined above.  Medication changes, Labs and Tests ordered today are listed in the Patient Instructions below. Patient Instructions  Dr Sallyanne Kuster recommends that you continue on your current medications as directed. Please refer to the Current Medication list given to you today.  Your physician recommends that you return for lab work in 6 months - FASTING. Please have your lab work done 5-7 days prior to your appointment.  Dr Sallyanne Kuster recommends that you schedule a follow-up appointment in 6 months. You will receive a reminder letter in the mail two months in advance. If you don't receive a letter, please call our office to schedule the follow-up appointment.  If you need a refill on your cardiac medications before your next appointment, please call your pharmacy.    Signed, Sanda Klein, MD  03/28/2016 2:41 PM    [facility] [address], [place], [place]  [address] Phone: [phone]; Fax: [phone]

## 2016-04-07 ENCOUNTER — Ambulatory Visit (INDEPENDENT_AMBULATORY_CARE_PROVIDER_SITE_OTHER): Payer: Medicare Other | Admitting: Podiatry

## 2016-04-07 ENCOUNTER — Encounter: Payer: Self-pay | Admitting: Podiatry

## 2016-04-07 DIAGNOSIS — M2041 Other hammer toe(s) (acquired), right foot: Secondary | ICD-10-CM

## 2016-04-07 DIAGNOSIS — Q828 Other specified congenital malformations of skin: Secondary | ICD-10-CM | POA: Diagnosis not present

## 2016-04-07 NOTE — Progress Notes (Signed)
She presents today with a chief complaint of a painful callus to the dorsal medial aspect of the fifth digit of the right foot. She states that the corn has been present for many years with right crus to continue to trim.  Objective: Vital signs are stable alert and oriented 3. Pulses are palpable. Adductovarus rotated hammertoe deformity resulting in a hyperkeratotic lesion to the medial aspect of the fifth digit right foot. She also has a slight hypertrophic lesion to the lateral aspect of the PIPJ fourth digit right foot.  Assessment: Chronic reactive hyperkeratotic lesion medial aspect fifth digit right foot.  Plan: Debridement of the lesion today and placement of padding follow-up with her in 5 weeks remember to ask about her granddaughter's wedding.

## 2016-05-05 ENCOUNTER — Other Ambulatory Visit: Payer: Self-pay | Admitting: Cardiovascular Disease

## 2016-05-05 NOTE — Telephone Encounter (Signed)
Rx(s) sent to pharmacy electronically.  

## 2016-05-12 ENCOUNTER — Encounter: Payer: Self-pay | Admitting: Podiatry

## 2016-05-12 ENCOUNTER — Ambulatory Visit (INDEPENDENT_AMBULATORY_CARE_PROVIDER_SITE_OTHER): Payer: Medicare Other | Admitting: Podiatry

## 2016-05-12 DIAGNOSIS — E119 Type 2 diabetes mellitus without complications: Secondary | ICD-10-CM

## 2016-05-12 DIAGNOSIS — Q828 Other specified congenital malformations of skin: Secondary | ICD-10-CM | POA: Diagnosis not present

## 2016-05-12 DIAGNOSIS — M2041 Other hammer toe(s) (acquired), right foot: Secondary | ICD-10-CM

## 2016-05-12 NOTE — Progress Notes (Signed)
She presents today with a chief complaint of a painful corn fifth digit right foot.  Objective: Vital signs are stable alert and oriented 3. Pulses are palpable. Porokeratosis on medial aspect of the fifth digit right foot. No open lesions or wounds.  Assessment: Porokeratosis hammertoe deformity fifth digit right foot.  Plan: Debridement of porokeratotic lesion. Follow up with me as needed.

## 2016-05-19 ENCOUNTER — Other Ambulatory Visit: Payer: Self-pay | Admitting: *Deleted

## 2016-05-19 MED ORDER — LOSARTAN POTASSIUM-HCTZ 100-25 MG PO TABS: 1.0000 | ORAL_TABLET | Freq: Every day | ORAL | Status: DC

## 2016-05-19 NOTE — Telephone Encounter (Signed)
Rx(s) sent to pharmacy electronically.  

## 2016-06-21 ENCOUNTER — Encounter: Payer: Self-pay | Admitting: Podiatry

## 2016-06-21 ENCOUNTER — Ambulatory Visit (INDEPENDENT_AMBULATORY_CARE_PROVIDER_SITE_OTHER): Payer: Medicare Other | Admitting: Podiatry

## 2016-06-21 DIAGNOSIS — Q828 Other specified congenital malformations of skin: Secondary | ICD-10-CM

## 2016-06-21 DIAGNOSIS — E119 Type 2 diabetes mellitus without complications: Secondary | ICD-10-CM

## 2016-06-21 NOTE — Progress Notes (Signed)
She presents today for follow-up of her painful porokeratotic lesion medial aspect of digit right foot.  Objective: Vital signs are stable she's alert and oriented 3. Hammertoe deformity with porokeratosis medial aspect of his right foot is present.  Assessment: Pain in limb secondary porokeratosis.  Plan: Debridement of reactive hyperkeratosis follow up with me in 1 month

## 2016-07-26 ENCOUNTER — Ambulatory Visit (INDEPENDENT_AMBULATORY_CARE_PROVIDER_SITE_OTHER): Payer: Medicare Other | Admitting: Podiatry

## 2016-07-26 DIAGNOSIS — Q828 Other specified congenital malformations of skin: Secondary | ICD-10-CM | POA: Diagnosis not present

## 2016-07-26 NOTE — Progress Notes (Signed)
She presents today for follow-up of her painful corn to the fifth digit of her right foot. She states that seems to be better this time than it was previously.  Objective: Vital signs are stable she is alert and oriented 3. Pulses remain palpable adductor varus rotated hammertoe deformities bilateral right greater than left resulting in a reactive porokeratotic lesion medial aspect of the distal phalanx fifth digit right foot.  Assessment: Painful porokeratosis fifth digit right foot.  Plan: Debridement of reactive hyperkeratotic lesion discussed padding and surgical intervention follow up with her as needed.

## 2016-09-01 ENCOUNTER — Encounter: Payer: Self-pay | Admitting: Podiatry

## 2016-09-01 ENCOUNTER — Ambulatory Visit (INDEPENDENT_AMBULATORY_CARE_PROVIDER_SITE_OTHER): Payer: Medicare Other | Admitting: Podiatry

## 2016-09-01 DIAGNOSIS — M2041 Other hammer toe(s) (acquired), right foot: Secondary | ICD-10-CM

## 2016-09-01 DIAGNOSIS — Q828 Other specified congenital malformations of skin: Secondary | ICD-10-CM | POA: Diagnosis not present

## 2016-09-01 DIAGNOSIS — E119 Type 2 diabetes mellitus without complications: Secondary | ICD-10-CM | POA: Diagnosis not present

## 2016-09-03 NOTE — Progress Notes (Signed)
She presents today chief complaint of painful lesion to the fifth digit of the right foot. She states this seems to be doing better at this point that it has previously.  Objective: Vital signs are stable she is alert and oriented 3. Pulses are palpable. No erythema or edema cellulitis drainage or odor. Reactive hyperkeratosis fifth digit right foot adductovarus rotated hammertoe deformity.  Assessment: Pain limb secondary to porokeratosis medial aspect fifth digit right foot.  Plan: Debridement of reactive hyperkeratotic lesion laced aperture pad and will follow up with her in 5 weeks.

## 2016-09-13 ENCOUNTER — Ambulatory Visit (INDEPENDENT_AMBULATORY_CARE_PROVIDER_SITE_OTHER): Payer: Medicare Other | Admitting: Urgent Care

## 2016-09-13 ENCOUNTER — Ambulatory Visit (INDEPENDENT_AMBULATORY_CARE_PROVIDER_SITE_OTHER): Payer: Medicare Other

## 2016-09-13 VITALS — BP 124/80 | HR 81 | Temp 97.9°F | Resp 17 | Ht 64.0 in | Wt 192.0 lb

## 2016-09-13 DIAGNOSIS — M160 Bilateral primary osteoarthritis of hip: Secondary | ICD-10-CM

## 2016-09-13 DIAGNOSIS — E119 Type 2 diabetes mellitus without complications: Secondary | ICD-10-CM | POA: Diagnosis not present

## 2016-09-13 DIAGNOSIS — Z794 Long term (current) use of insulin: Secondary | ICD-10-CM

## 2016-09-13 DIAGNOSIS — M25552 Pain in left hip: Secondary | ICD-10-CM

## 2016-09-13 DIAGNOSIS — M79662 Pain in left lower leg: Secondary | ICD-10-CM | POA: Diagnosis not present

## 2016-09-13 MED ORDER — PREDNISONE 20 MG PO TABS: ORAL_TABLET | ORAL | 0 refills | Status: DC

## 2016-09-13 NOTE — Patient Instructions (Addendum)
Hip Arthritis - You may take 500mg  - 650mg  every 6 hours for pain and inflammation associated with your arthritis.    Lower leg pain - Please hydrate very well with at least 1-2 liters of water daily. Continue taking your Eliquis. Let me know if you start having calf pain, redness, warmth, swelling. We will pursue an U/S if this is the case. If your lower leg pain persists, we may need to see if this is a vascular problem, obtain an U/S to look into PVD.   Osteoarthritis Osteoarthritis is a disease that causes soreness and inflammation of a joint. It occurs when the cartilage at the affected joint wears down. Cartilage acts as a cushion, covering the ends of bones where they meet to form a joint. Osteoarthritis is the most common form of arthritis. It often occurs in older people. The joints affected most often by this condition include those in the:  Ends of the fingers.  Thumbs.  Neck.  Lower back.  Knees.  Hips. CAUSES  Over time, the cartilage that covers the ends of bones begins to wear away. This causes bone to rub on bone, producing pain and stiffness in the affected joints.  RISK FACTORS Certain factors can increase your chances of having osteoarthritis, including:  Older age.  Excessive body weight.  Overuse of joints.  Previous joint injury. SIGNS AND SYMPTOMS   Pain, swelling, and stiffness in the joint.  Over time, the joint may lose its normal shape.  Small deposits of bone (osteophytes) may grow on the edges of the joint.  Bits of bone or cartilage can break off and float inside the joint space. This may cause more pain and damage. DIAGNOSIS  Your health care provider will do a physical exam and ask about your symptoms. Various tests may be ordered, such as:  X-rays of the affected joint.  Blood tests to rule out other types of arthritis. Additional tests may be used to diagnose your condition. TREATMENT  Goals of treatment are to control pain and  improve joint function. Treatment plans may include:  A prescribed exercise program that allows for rest and joint relief.  A weight control plan.  Pain relief techniques, such as:  Properly applied heat and cold.  Electric pulses delivered to nerve endings under the skin (transcutaneous electrical nerve stimulation [TENS]).  Massage.  Certain nutritional supplements.  Medicines to control pain, such as:  Acetaminophen.  Nonsteroidal anti-inflammatory drugs (NSAIDs), such as naproxen.  Narcotic or central-acting agents, such as tramadol.  Corticosteroids. These can be given orally or as an injection.  Surgery to reposition the bones and relieve pain (osteotomy) or to remove loose pieces of bone and cartilage. Joint replacement may be needed in advanced states of osteoarthritis. HOME CARE INSTRUCTIONS   Take medicines only as directed by your health care provider.  Maintain a healthy weight. Follow your health care provider's instructions for weight control. This may include dietary instructions.  Exercise as directed. Your health care provider can recommend specific types of exercise. These may include:  Strengthening exercises. These are done to strengthen the muscles that support joints affected by arthritis. They can be performed with weights or with exercise bands to add resistance.  Aerobic activities. These are exercises, such as brisk walking or low-impact aerobics, that get your heart pumping.  Range-of-motion activities. These keep your joints limber.  Balance and agility exercises. These help you maintain daily living skills.  Rest your affected joints as directed by your health  care provider.  Keep all follow-up visits as directed by your health care provider. SEEK MEDICAL CARE IF:   Your skin turns red.  You develop a rash in addition to your joint pain.  You have worsening joint pain.  You have a fever along with joint or muscle aches. SEEK  IMMEDIATE MEDICAL CARE IF:  You have a significant loss of weight or appetite.  You have night sweats. [place] of Arthritis and Musculoskeletal and Skin Diseases: www.niams.SouthExposed.es  [employer] on Aging: http://kim-miller.com/  American College of Rheumatology: www.rheumatology.org   This information is not intended to replace advice given to you by your health care provider. Make sure you discuss any questions you have with your health care provider.   Document Released: 10/24/2005 Document Revised: 11/14/2014 Document Reviewed: 07/01/2013 Elsevier Interactive Patient Education 2016 [employer].    IF you received an x-ray today, you will receive an invoice from [facility]. Please contact [facility] at [phone] with questions or concerns regarding your invoice.   IF you received labwork today, you will receive an invoice from [employer]. Please contact [facility] at [phone] with questions or concerns regarding your invoice.   Our billing staff will not be able to assist you with questions regarding bills from these companies.  You will be contacted with the lab results as soon as they are available. The fastest way to get your results is to activate your My Chart account. Instructions are located on the last page of this paperwork. If you have not heard from [place] regarding the results in 2 weeks, please contact this office.

## 2016-09-13 NOTE — Progress Notes (Signed)
MRN: [id number] DOB: 1941-02-17  Subjective:   Renee Ray is a 75 y.o. female presenting for chief complaint of Hip Pain (left side )  Reports 3 day history of left hip pain, pain over front-lateral left lower leg. Her hip pain is severe, located over buttock and lateral hip, is aggravated by standing and walking. Sitting makes her hip pain better. Has had acupuncture in the past with good results. Her leg pain has been more longstanding. The pain is occasional, occurs mostly at night, feels sharp, is less severe/painful than her hip pain. Changing positions for her leg can help. Has tried Tylenol 1000mg  twice daily and heat pad with minimal-some relief. Denies fever, redness, trauma, history of arthritis, swelling, numbness or tingling. Patient has diabetes, managed with insulin, ,metformin. Last a1c check was in October 2017, was 7.1. She also take Eliquis for atrial fibrillation.   Renee Ray has a current medication list which includes the following prescription(s): b complex vitamins, bd pen needle nano u/f, calcium carbonate, cholecalciferol, eliquis, ferrous sulfate, furosemide, glucose blood, insulin glargine, lantus solostar, levothyroxine, losartan-hydrochlorothiazide, magnesium, metformin, multivitamin, omega-3 fatty acids, potassium chloride sa, proair hfa, and rosuvastatin. Also is allergic to janumet [sitagliptin-metformin hcl]; amoxicillin; and sulfonamide derivatives.  Renee Ray  has a past medical history of Abnormal echocardiogram (2010); Acute medial meniscal injury of knee; Asthmatic bronchitis , chronic ([place]); Atrial fibrillation ([place]); Bruise; Chronic anticoagulation; Chronic atrial fibrillation ([place]); Chronic renal insufficiency; COPD, moderate ([place]); Diabetes mellitus; Goiter (12/15); History of pneumonia; Hyperlipidemia; Hypertension; Non-ischemic cardiomyopathy ([place]); Normal nuclear stress test (2008); and OA (osteoarthritis) of knee. Also  has a past surgical history that  includes Breast enhancement surgery (1970); Vaginal hysterectomy (1978); and Knee arthroscopy (03/02/2012).   Objective:   Vitals: BP 124/80 (BP Location: Right Arm, Patient Position: Sitting, Cuff Size: Normal)   Pulse 81   Temp 97.9 F (36.6 C) (Oral)   Resp 17   Ht 5\' 4"  (1.626 m)   Wt 192 lb (87.1 kg)   SpO2 96%   BMI 32.96 kg/m   Physical Exam  Constitutional: She is oriented to person, place, and time. She appears well-developed and well-nourished.  Cardiovascular: Normal rate.   Pulmonary/Chest: Effort normal.  Musculoskeletal:       Left hip: She exhibits tenderness (over areas depicted). She exhibits normal range of motion, normal strength, no bony tenderness, no swelling, no crepitus, no deformity and no laceration.       Left knee: She exhibits normal range of motion, no swelling, no effusion, no ecchymosis, no deformity, no erythema, normal alignment and normal patellar mobility. No tenderness found.       Lumbar back: She exhibits normal range of motion, no tenderness, no bony tenderness, no swelling, no edema, no deformity, no laceration and no spasm.       Left lower leg: She exhibits no tenderness (negative Homann sign), no bony tenderness, no swelling, no edema, no deformity and no laceration.       Legs: Neurological: She is alert and oriented to person, place, and time.  Skin: Skin is warm and dry.    Dg Hip Unilat W Or W/o Pelvis 2-3 Views Left  Result Date: 09/13/2016 CLINICAL DATA:  Left hip pain EXAM: DG HIP (WITH OR WITHOUT PELVIS) 2-3V LEFT COMPARISON:  None. FINDINGS: Three views of the left hip submitted. No acute fracture or subluxation. Degenerative changes are noted bilateral hip joints with mild superior acetabular spurring. Narrowing of superior hip joint space bilaterally. Mild bilateral  spurring of femoral head. IMPRESSION: No acute fracture or subluxation degenerative changes bilateral hip joints. Electronically Signed   By: Lahoma Crocker M.D.   On:  09/13/2016 10:47   Assessment and Plan :   1. Osteoarthritis of both hips, unspecified osteoarthritis type 2. Left hip pain 3. Pain in left lower leg 4. Controlled type 2 diabetes mellitus without complication, with long-term current use of insulin ([place]) - Patient would like to try and schedule her APAP more regularly. She will fill script for steroid if this does not work. Referral to ortho if no improvement. She will try to hydrate better for her left lower leg pain. I do not suspected she has DVT due to her HPI, physical exam findings and anti-coagulation. Patient will rtc if no improvement in 2 weeks.  Jaynee Eagles, PA-C [facility] [phone] 09/13/2016 10:29 AM

## 2016-09-14 ENCOUNTER — Encounter: Payer: Self-pay | Admitting: Gastroenterology

## 2016-09-15 ENCOUNTER — Other Ambulatory Visit: Payer: Self-pay

## 2016-09-15 MED ORDER — POTASSIUM CHLORIDE CRYS ER 20 MEQ PO TBCR: 20.0000 meq | EXTENDED_RELEASE_TABLET | Freq: Every day | ORAL | 4 refills | Status: DC

## 2016-09-20 ENCOUNTER — Telehealth: Payer: Self-pay

## 2016-09-20 NOTE — Telephone Encounter (Signed)
Pt states that she has finished her prednizone and is still having hip and leg pain and is wanting to talk with someone about getting a possible referral for an ultrasound of her leg    Best number [phone]

## 2016-09-22 ENCOUNTER — Ambulatory Visit (HOSPITAL_COMMUNITY)
Admission: RE | Admit: 2016-09-22 | Discharge: 2016-09-22 | Disposition: A | Discharge status: Home / Self Care | Payer: Medicare Other | Source: Ambulatory Visit | Attending: Cardiology | Admitting: Cardiology

## 2016-09-22 ENCOUNTER — Ambulatory Visit (INDEPENDENT_AMBULATORY_CARE_PROVIDER_SITE_OTHER): Payer: Medicare Other | Admitting: Internal Medicine

## 2016-09-22 ENCOUNTER — Encounter: Payer: Self-pay | Admitting: Internal Medicine

## 2016-09-22 ENCOUNTER — Other Ambulatory Visit: Payer: Self-pay

## 2016-09-22 ENCOUNTER — Other Ambulatory Visit (INDEPENDENT_AMBULATORY_CARE_PROVIDER_SITE_OTHER): Payer: Medicare Other

## 2016-09-22 ENCOUNTER — Encounter (HOSPITAL_COMMUNITY): Payer: Self-pay | Admitting: Cardiology

## 2016-09-22 VITALS — BP 128/70 | HR 96 | Ht 63.0 in | Wt 191.8 lb

## 2016-09-22 DIAGNOSIS — M79662 Pain in left lower leg: Secondary | ICD-10-CM | POA: Diagnosis not present

## 2016-09-22 DIAGNOSIS — J449 Chronic obstructive pulmonary disease, unspecified: Secondary | ICD-10-CM | POA: Insufficient documentation

## 2016-09-22 DIAGNOSIS — I4891 Unspecified atrial fibrillation: Secondary | ICD-10-CM | POA: Diagnosis not present

## 2016-09-22 DIAGNOSIS — Z7901 Long term (current) use of anticoagulants: Secondary | ICD-10-CM | POA: Diagnosis not present

## 2016-09-22 DIAGNOSIS — Z79899 Other long term (current) drug therapy: Secondary | ICD-10-CM

## 2016-09-22 DIAGNOSIS — E785 Hyperlipidemia, unspecified: Secondary | ICD-10-CM

## 2016-09-22 DIAGNOSIS — E119 Type 2 diabetes mellitus without complications: Secondary | ICD-10-CM | POA: Insufficient documentation

## 2016-09-22 DIAGNOSIS — E669 Obesity, unspecified: Secondary | ICD-10-CM | POA: Insufficient documentation

## 2016-09-22 DIAGNOSIS — M79669 Pain in unspecified lower leg: Secondary | ICD-10-CM

## 2016-09-22 LAB — CBC WITH DIFFERENTIAL/PLATELET
BASOS ABS: 0 10*3/uL (ref 0.0–0.1)
Basophils Relative: 0.2 % (ref 0.0–3.0)
EOS PCT: 1.7 % (ref 0.0–5.0)
Eosinophils Absolute: 0.2 10*3/uL (ref 0.0–0.7)
HCT: 41.6 % (ref 36.0–46.0)
HEMOGLOBIN: 13.8 g/dL (ref 12.0–15.0)
LYMPHS ABS: 1.3 10*3/uL (ref 0.7–4.0)
Lymphocytes Relative: 15.1 % (ref 12.0–46.0)
MCHC: 33.2 g/dL (ref 30.0–36.0)
MCV: 90.1 fl (ref 78.0–100.0)
MONOS PCT: 10.6 % (ref 3.0–12.0)
Monocytes Absolute: 0.9 10*3/uL (ref 0.1–1.0)
NEUTROS PCT: 72.4 % (ref 43.0–77.0)
Neutro Abs: 6.4 10*3/uL (ref 1.4–7.7)
Platelets: 305 10*3/uL (ref 150.0–400.0)
RBC: 4.62 Mil/uL (ref 3.87–5.11)
RDW: 13.6 % (ref 11.5–15.5)
WBC: 8.9 10*3/uL (ref 4.0–10.5)

## 2016-09-22 LAB — BASIC METABOLIC PANEL
BUN: 28 mg/dL — ABNORMAL HIGH (ref 6–23)
CHLORIDE: 100 meq/L (ref 96–112)
CO2: 30 meq/L (ref 19–32)
CREATININE: 0.86 mg/dL (ref 0.40–1.20)
Calcium: 10.2 mg/dL (ref 8.4–10.5)
GFR: 68.16 mL/min (ref >60.00)
GLUCOSE: 276 mg/dL — AB (ref 70–99)
Potassium: 4.6 mEq/L (ref 3.5–5.1)
SODIUM: 139 meq/L (ref 135–145)

## 2016-09-22 LAB — HEPATIC FUNCTION PANEL
ALBUMIN: 4.1 g/dL (ref 3.5–5.2)
ALK PHOS: 79 U/L (ref 39–117)
ALT: 25 U/L (ref 0–35)
AST: 16 U/L (ref 0–37)
BILIRUBIN DIRECT: 0.1 mg/dL (ref 0.0–0.3)
TOTAL PROTEIN: 6.7 g/dL (ref 6.0–8.3)
Total Bilirubin: 0.5 mg/dL (ref 0.2–1.2)

## 2016-09-22 LAB — SEDIMENTATION RATE: Sed Rate: 26 mm/hr (ref 0–30)

## 2016-09-22 LAB — D-DIMER, QUANTITATIVE: D-Dimer, Quant: 0.23 mcg/mL FEU (ref <0.50)

## 2016-09-22 NOTE — Patient Instructions (Signed)
Order- office spirometry    Dx COPD mixed type  Order- lab CBC w diff, BMET, Hepatic function panel, D-dimer, sed rate    Dx calf pain  Order- schedule doppler leg veins   Left leg calf pain  Sample Anoro Ellipta inhaler      Try inhaling one puff, once daily    See if it helps your breathing

## 2016-09-22 NOTE — Telephone Encounter (Signed)
Renee Ray, it looks like specialists may have already taken care of this, but wanted to forward to you for review.

## 2016-09-22 NOTE — Progress Notes (Signed)
HPI female former smoker followed for asthmatic bronchitis complicated by DM, CM, atrial fibrillation.  PFT 10/05/2011-FVC 2.82/105%, FEV1 1.72/91%, ratio 0.48, FEF 80-76 year old 0.74/34%, TLC 5.09/111%, DLCO 74%    09/23/14- 75 year old female former smoker followed for asthmatic bronchitis complicated by DM, CM, atrial fibrillation. Widow of Dr. Herbie Baltimore Groot/neurosurgeon FOLLOWS FOR: Slight sinus issues with cough today; continues to have SOB with exertion as well.  Had flu vax. Continues Pulm Rehab- discussed. Keeps small supply prednisone, taking 5 mg very occasionally seems to control mild wheeze/ dyspnea. Uses inhalers prn.  CXR 09/05/13 FINDINGS: Borderline cardiomegaly, unchanged. Chronic peribronchial thickening. Pectus carinatum deformity. Lungs are hyperinflated consistent with emphysema. No acute osseous abnormality. No infiltrates or effusions. IMPRESSION: Emphysema and chronic peribronchial thickening. No acute abnormalities. Electronically Signed  By: Rozetta Nunnery M.D.  On: 09/05/2013 10:28  09/25/15- 75 year old female former smoker followed for asthmatic bronchitis complicated by DM, CM, atrial fibrillation. Widow of Dr. Herbie Baltimore Ariola/neurosurgeon FOLLOWS FOR: Pt states her breathing is well overall; slight wheezing and uses rescue Inahler at times. She stopped using Spiriva finding it not cost-effective. Uses rescue inhaler 2-4 times a week. Not wheezing at night. CXR 09/23/2014 IMPRESSION: Enlargement of cardiac silhouette. Chronic bronchitic changes. No acute abnormalities. Electronically Signed  By: Lavonia Dana M.D.  On: 09/23/2014 17:11  09/22/2016-75 year old female former smoker followed for asthmatic bronchitis, complicated by DM, CM, atrial fibrillation Widow of Dr. Herbie Baltimore Marini/neurosurgeon Up-to-date on flu and pneumococcal vaccines Follows For: pt feels good overall however pt went to urgent care last week and you was diagonsed with  Arthritis.pt was given meds that helped with the hip pain but has bad pain in knee and the meds isn't working   History Advil triggered severe asthma requiring hospitalization. No history of nasal polyps. Now on prednisone 40 mg daily 5 days for arthritis and hip. Concerned about left calf pain-lateral and anterior. Asks about blood clot although she is on Eliquis. Rarely needs rescue inhaler with no respiratory sleep disturbance. Office Spirometry 09/22/2016-moderate obstructive airways disease. FVC 2.13/80%, FEV1 1.23/62%, ratio 0.58, FEF 25-75 % 0.51/33%.   ROS-see HPI Constitutional:   No-   weight loss, night sweats, +fevers, chills, fatigue, lassitude. HEENT:   No-  headaches, difficulty swallowing, tooth/dental problems, sore throat,       No-  sneezing, itching, ear ache, +nasal congestion, post nasal drip,  CV:  No-   chest pain, orthopnea, PND, swelling in lower extremities, anasarca, dizziness, palpitations Resp:  +Some shortness of breath with exertion or at rest.              No-productive cough,  No non-productive cough,  No- coughing up of blood.              No-   change in color of mucus.  + wheezing.   Skin: No-   rash or lesions. GI:  No-   heartburn, indigestion, abdominal pain, nausea, vomiting,  GU: MS:  No-   joint pain or swelling.   Neuro-     nothing unusual Psych:  No- change in mood or affect. No depression or anxiety.  No memory loss.  OBJ General- Alert, Oriented, Affect-appropriate, Distress- none acute.  Skin- rash-none, lesions- none, excoriation- none; warm  Lymphadenopathy- none Head- atraumatic            Eyes- Gross vision intact, PERRLA, conjunctivae clear secretions            Ears- Hearing, canals-normal  Nose- Clear, no-Septal dev, mucus, polyps, erosion, perforation             Throat- Mallampati II , mucosa red , drainage- none, tonsils- atrophic Neck- flexible , trachea midline, no stridor , thyroid nl, carotid no bruit Chest -  symmetrical excursion , unlabored           Heart/CV- + AF/ IRR , no murmur , no gallop  , no rub, nl s1 s2                           - JVD- none , edema- none, stasis changes- none, varices- none           Lung- + sounds  distant, wheeze-none, unlabored, cough- none , dullness-none, rub- none           Chest wall-  Abd- Br/ Gen/ Rectal- Not done, not indicated Extrem- cyanosis- none, clubbing, none, atrophy- none, strength- nl, Neg Homan's Neuro- grossly intact to observation

## 2016-09-22 NOTE — Progress Notes (Signed)
Venous duplex negative for DVT. 

## 2016-09-24 DIAGNOSIS — M79662 Pain in left lower leg: Secondary | ICD-10-CM | POA: Insufficient documentation

## 2016-09-24 NOTE — Assessment & Plan Note (Signed)
She is on Eliquis and [name] is negative at this visit, but she is significantly concerned about possibility of DVT. Plan-d-dimer, Doppler leg veins. Recognize history of asthma triggered by NSAID Advil. Recommend cold pack. See PCP to follow-up on this.

## 2016-09-24 NOTE — Assessment & Plan Note (Signed)
Airflow obstruction is more evident on spirometry at this visit compared with PFT 2012. Today she happens to be on prednisone for arthritis. Would like to try LABA/LAMA Anoro, CXR

## 2016-09-26 ENCOUNTER — Ambulatory Visit (INDEPENDENT_AMBULATORY_CARE_PROVIDER_SITE_OTHER): Payer: Medicare Other | Admitting: Family Medicine

## 2016-09-26 ENCOUNTER — Ambulatory Visit: Payer: Medicare Other | Admitting: Internal Medicine

## 2016-09-26 ENCOUNTER — Ambulatory Visit (INDEPENDENT_AMBULATORY_CARE_PROVIDER_SITE_OTHER): Payer: Medicare Other

## 2016-09-26 ENCOUNTER — Telehealth: Payer: Self-pay | Admitting: Internal Medicine

## 2016-09-26 VITALS — BP 124/80 | HR 94 | Temp 98.2°F | Resp 17 | Ht 62.5 in | Wt 188.0 lb

## 2016-09-26 DIAGNOSIS — E1149 Type 2 diabetes mellitus with other diabetic neurological complication: Secondary | ICD-10-CM | POA: Diagnosis not present

## 2016-09-26 DIAGNOSIS — M79662 Pain in left lower leg: Secondary | ICD-10-CM

## 2016-09-26 DIAGNOSIS — E785 Hyperlipidemia, unspecified: Secondary | ICD-10-CM

## 2016-09-26 MED ORDER — TRAMADOL HCL 50 MG PO TABS: 50.0000 mg | ORAL_TABLET | Freq: Three times a day (TID) | ORAL | 0 refills | Status: DC | PRN

## 2016-09-26 MED ORDER — GABAPENTIN 100 MG PO CAPS: ORAL_CAPSULE | ORAL | 0 refills | Status: DC

## 2016-09-26 NOTE — Progress Notes (Signed)
Subjective:  By signing my name below, I, Renee Ray, attest that this documentation has been prepared under the direction and in the presence of Renee Agreste, MD Electronically Signed: Ladene Artist, ED Scribe 09/26/2016 at 12:27 PM.   Patient ID: Renee Ray, female    DOB: 1941-01-06, 75 y.o.   MRN: [id number]  Chief Complaint  Patient presents with  . Follow-up    leg pain    HPI HPI Comments: Renee Ray is a 75 y.o. female who presents to the [facility] for a follow-up of leg pain. She is seen by Jaynee Eagles on 11/7 with a 3 day h/o left hip pain. Based on his notes located over buttock to lateral hip worse with walking. She had treated pain in past with acupuncture and Tylenol and heating pad with minimal relief. XR of left hip. No acute fracture but degenerative changes bilateral hip joints, she was treated with scheduled Tylenol. And prescription for 40 mg prednisone QD for 5 days if not improving. She was seen by Dr. Annamaria Boots on 11/16 she had a D-dimer that was normal at 0.23 and normal sed rate. Venus doppler on 11/16 that was negative for DVT.   Today, pt states that left hip pain has improved but she is still experiencing left low leg pain. She describes pain as a sharp sensation that is exacerbated with stretching in the bed at night. Pt states that pain is so severe at night that she is only getting 3-4 hours of sleep at night. She states that pain is mild during the day and does not really give her any issues with ambulating. Pt has tried heat without significant relief and states that ice exacerbates pain. She denies walking more than usual. Pt denies fever or swelling to her left leg.   Patient Active Problem List   Diagnosis Date Noted  . Pain of left calf 09/24/2016  . Osteoarthritis of both hands 08/30/2015  . Abnormal SPEP 08/30/2015  . Anemia, iron deficiency 10/16/2014  . Chronic anticoagulation 10/16/2014  . Iron deficiency anemia  10/09/2014  . Encounter for therapeutic drug monitoring 01/06/2014  . Upper respiratory infection 09/02/2013  . Heel spur 09/06/2012  . Acute medial meniscal tear 03/02/2012  . Hyperlipidemia 08/24/2011  . Benign hypertensive heart disease without heart failure 05/16/2011  . COPD mixed type ([place]) 09/03/2009  . DIABETES MELLITUS 09/04/2008  . CARDIOMYOPATHY 09/04/2008  . ATRIAL FIBRILLATION 09/04/2008  . ASTHMATIC BRONCHITIS, ACUTE 09/04/2008   Past Medical History:  Diagnosis Date  . Abnormal echocardiogram 2010   showing slightly enlarged left atrium with normal LV function and normal PA pressures.   . Acute medial meniscal injury of knee    RIGHT KNEE  . Asthmatic bronchitis , chronic ([place])   . Atrial fibrillation ([place])   . Bruise    RIGHT ABD. DUE TO LOVENOX INJECTION  02-27-2012  . Chronic anticoagulation    coumadin  . Chronic atrial fibrillation ([place])    CARDIOLOGIST- DR BRACKBILL-- LAST VISIT  12-16-2011 IN [facility]  . Chronic renal insufficiency   . COPD, moderate ([place])    PULMOLOGIST- DR YOUNG --  LAST VISIT NOTE 02-23-2012 IN [facility]  . Diabetes mellitus    INSULIN AND ORAL MEDS  . Goiter 12/15   will have radiation in February  . History of pneumonia    associated w/  rhabdomyolysis  OCT 2012  . Hyperlipidemia   . Hypertension   . Non-ischemic cardiomyopathy ([place])  normal ef  . Normal nuclear stress test 2008  . OA (osteoarthritis) of knee    HANDS   Past Surgical History:  Procedure Laterality Date  . [place]--- BREAST IMPLANTS REMOVED   . KNEE ARTHROSCOPY  03/02/2012   Procedure: ARTHROSCOPY KNEE;  Surgeon: Gearlean Alf, MD;  [facility];  Service: Orthopedics;  Laterality: Right;  WITH DEBRIDEMENT of medial menisces  . VAGINAL HYSTERECTOMY  1978   Allergies  Allergen Reactions  . Advil [Ibuprofen] Shortness Of Breath    Asthma  . Janumet [Sitagliptin-Metformin Hcl] Swelling  . Amoxicillin  Diarrhea  . Sulfonamide Derivatives Other (See Comments)    REACTION: thrush   Prior to Admission medications   Medication Sig Start Date End Date Taking? Authorizing Provider  b complex vitamins capsule Take 1 capsule by mouth daily.    Yes Historical Provider, MD  BD PEN NEEDLE NANO U/F 32G X 4 MM MISC See admin instructions. 08/31/15  Yes Historical Provider, MD  calcium carbonate (OS-CAL) 600 MG TABS Take 600 mg by mouth daily.    Yes Historical Provider, MD  cholecalciferol (VITAMIN D) 1000 UNITS tablet Take 1,000 Units by mouth daily.    Yes Historical Provider, MD  ELIQUIS 5 MG TABS tablet TAKE 1 TABLET (5 MG TOTAL) BY MOUTH 2 (TWO) TIMES DAILY. 10/08/15  Yes Darlin Coco, MD  ferrous sulfate 325 (65 FE) MG tablet Take 325 mg by mouth daily with breakfast.   Yes Historical Provider, MD  furosemide (LASIX) 40 MG tablet TAKE 1 TABLET BY MOUTH ONCE DAILY 10/12/15  Yes Darlin Coco, MD  glucose blood test strip as directed. 02/03/12  Yes Historical Provider, MD  insulin glargine (LANTUS) 100 UNIT/ML injection Inject 40 Units into the skin at bedtime.    Yes Historical Provider, MD  LANTUS SOLOSTAR 100 UNIT/ML Solostar Pen Inject 50 Units into the skin at bedtime.  08/28/15  Yes Historical Provider, MD  levothyroxine (SYNTHROID, LEVOTHROID) 175 MCG tablet Take 175 mcg by mouth daily before breakfast. 11/24/15  Yes Historical Provider, MD  losartan-hydrochlorothiazide (HYZAAR) 100-25 MG tablet Take 1 tablet by mouth daily. 05/19/16  Yes Mihai Croitoru, MD  Magnesium 200 MG TABS Take 200 mg by mouth daily.   Yes Historical Provider, MD  metFORMIN (GLUCOPHAGE-XR) 500 MG 24 hr tablet Take 4 tablets by mouth every evening. Take with evening meal 10/28/15  Yes Historical Provider, MD  Multiple Vitamin (MULTIVITAMIN) tablet Take 1 tablet by mouth daily.    Yes Historical Provider, MD  Omega-3 Fatty Acids (FISH OIL PO) Take 1 tablet by mouth daily.    Yes Historical Provider, MD  potassium  chloride SA (KLOR-CON M20) 20 MEQ tablet Take 1 tablet (20 mEq total) by mouth daily. 09/15/16  Yes Mihai Croitoru, MD  predniSONE (DELTASONE) 20 MG tablet Take 2 tablets daily with breakfast. 09/13/16  Yes Mario Mani, PA-C  PROAIR HFA 108 (90 BASE) MCG/ACT inhaler INHALE 2 PUFFS EVERY 4 HOURS AS NEEDED. 10/09/15  Yes Deneise Lever, MD  rosuvastatin (CRESTOR) 5 MG tablet TAKE 1 TABLET BY MOUTH EVERY DAY 05/05/16  Yes Sanda Klein, MD   Social History   Social History  . Marital status: Widowed    Spouse name: N/A  . Number of children: 1  . Years of education: N/A   Occupational History  .      Part-Time at [employer] History Main Topics  . Smoking status:  Former Smoker    Packs/day: 1.00    Years: 20.00    Types: Cigarettes    Quit date: 05/05/1996  . Smokeless tobacco: Never Used  . Alcohol use No  . Drug use: No  . Sexual activity: No   Other Topics Concern  . Not on file   Social History Narrative  . No narrative on file   Review of Systems  Constitutional: Negative for fever.  Cardiovascular: Negative for leg swelling.    Vitals:   09/26/16 1208  BP: 124/80  Pulse: 94  Resp: 17  Temp: 98.2 F (36.8 C)  TempSrc: Oral  SpO2: 97%  Weight: 188 lb (85.3 kg)  Height: 5' 2.5" (1.588 m)      Objective:   Physical Exam  Constitutional: She is oriented to person, place, and time. She appears well-developed and well-nourished. No distress.  HENT:  Head: Normocephalic and atraumatic.  Eyes: Conjunctivae and EOM are normal.  Neck: Neck supple. No tracheal deviation present.  Cardiovascular: Normal rate.   Pulmonary/Chest: Effort normal. No respiratory distress.  Musculoskeletal: Normal range of motion.  L leg: Slight tenderness along the anterior tibial just medial to the tuberosity. Also tender along anterior mid tibial. Peroneals are nontender. Full strength with testing. No reproduction of leg pain with back ROM. Negative seated leg raise.    Neurological: She is alert and oriented to person, place, and time.  Skin: Skin is warm and dry.  Psychiatric: She has a normal mood and affect. Her behavior is normal.  Nursing note and vitals reviewed.     Assessment & Plan:  Pain in left lower leg - Plan: Ambulatory referral to Orthopedic Surgery, DG Tibia/Fibula Left  Other diabetic neurological complication associated with type 2 diabetes mellitus ([place])  Does not appear to be typical sciatica pain has unreproducible with lumbar exam or straight leg raise. No concerning findings on x-ray. Possible neuropathy, but not typical distribution of one specific nerve that I can tell.  - try Neurontin 100 200 mg daily at bedtime up to 3 times a day when necessary, side effects discussed,  - tramadol if needed for breakthrough pain, also discussed side effects of that medication and cautioned with combination with Neurontin.  -RTC precautions. Meds ordered this encounter  Medications  . gabapentin (NEURONTIN) 100 MG capsule    Sig: 1-2 at bedtime as needed - can take 1 up to tid prn.    Dispense:  90 capsule    Refill:  0  . traMADol (ULTRAM) 50 MG tablet    Sig: Take 1 tablet (50 mg total) by mouth every 8 (eight) hours as needed.    Dispense:  20 tablet    Refill:  0   Patient Instructions   Your pain may be due to nerve pain, so can initially try gabapentin 100 mg once at bedtime, or 2 pills at bedtime if needed. If that medication works for you without significant side effects, you can increase to one pill 3 times per day as needed. If you do not obtain any relief with the gabapentin, switch to the tramadol up to every 6-8 hours as needed. Both of those medicines can cause sedation so be careful with using them, and would recommend against combining those 2 medications. I will also refer you to orthopedics for evaluation. If there are any concerning findings on x-ray, I will let you know.  Return to the clinic or go to the nearest  emergency room if any of your  symptoms worsen or new symptoms occur.   IF you received an x-ray today, you will receive an invoice from [facility]. Please contact [facility] at [phone] with questions or concerns regarding your invoice.   IF you received labwork today, you will receive an invoice from [employer]. Please contact [facility] at [phone] with questions or concerns regarding your invoice.   Our billing staff will not be able to assist you with questions regarding bills from these companies.  You will be contacted with the lab results as soon as they are available. The fastest way to get your results is to activate your My Chart account. Instructions are located on the last page of this paperwork. If you have not heard from [place] regarding the results in 2 weeks, please contact this office.        I personally performed the services described in this documentation, which was scribed in my presence. The recorded information has been reviewed and considered, and addended by me as needed.   Signed,   [name], MD [facility].  09/28/16 3:45 PM

## 2016-09-26 NOTE — Telephone Encounter (Signed)
I didn't recommend any one in particular, but told her there were women working in primary care and that we could help her establish if she wanted.

## 2016-09-26 NOTE — Telephone Encounter (Signed)
Notes Recorded by Lorane Gell, CMA on 09/23/2016 at 5:26 PM EST lmtcb ------  Notes Recorded by Deneise Lever, MD on 09/23/2016 at 8:54 AM EST Doppler leg vein trest- negative for blood clot in left calf.  Notes Recorded by Lorane Gell, CMA on 09/23/2016 at 5:26 PM EST lmtcb ------  Notes Recorded by Deneise Lever, MD on 09/23/2016 at 8:52 AM EST Lab- D-dimer test is in normal range, making active blood clot proces unlikely   LMTCB

## 2016-09-26 NOTE — Telephone Encounter (Signed)
Pt would like to establish with PCP downstairs(female). She is aware we have placed referral and PCC's will reach out to her with appt date and time.

## 2016-09-26 NOTE — Telephone Encounter (Signed)
Pt aware of results & voiced understanding. Pt states when she was in office, CY had recommended a PCP name from downstairs. Pt would like to know who that provider is?  CY please advise. Thanks.

## 2016-09-26 NOTE — Telephone Encounter (Signed)
Dr. Baird Lyons had an U/S completed on 09/22/2016 and was negative.

## 2016-09-26 NOTE — Telephone Encounter (Signed)
Pt calling back [phone]

## 2016-09-26 NOTE — Patient Instructions (Addendum)
Your pain may be due to nerve pain, so can initially try gabapentin 100 mg once at bedtime, or 2 pills at bedtime if needed. If that medication works for you without significant side effects, you can increase to one pill 3 times per day as needed. If you do not obtain any relief with the gabapentin, switch to the tramadol up to every 6-8 hours as needed. Both of those medicines can cause sedation so be careful with using them, and would recommend against combining those 2 medications. I will also refer you to orthopedics for evaluation. If there are any concerning findings on x-ray, I will let you know.  Return to the clinic or go to the nearest emergency room if any of your symptoms worsen or new symptoms occur.   IF you received an x-ray today, you will receive an invoice from [facility]. Please contact [facility] at [phone] with questions or concerns regarding your invoice.   IF you received labwork today, you will receive an invoice from [employer]. Please contact [facility] at [phone] with questions or concerns regarding your invoice.   Our billing staff will not be able to assist you with questions regarding bills from these companies.  You will be contacted with the lab results as soon as they are available. The fastest way to get your results is to activate your My Chart account. Instructions are located on the last page of this paperwork. If you have not heard from [place] regarding the results in 2 weeks, please contact this office.

## 2016-09-27 ENCOUNTER — Other Ambulatory Visit: Payer: Self-pay | Admitting: Internal Medicine

## 2016-10-03 ENCOUNTER — Other Ambulatory Visit: Payer: Self-pay | Admitting: *Deleted

## 2016-10-03 ENCOUNTER — Telehealth: Payer: Self-pay | Admitting: Internal Medicine

## 2016-10-03 MED ORDER — FUROSEMIDE 40 MG PO TABS: 40.0000 mg | ORAL_TABLET | Freq: Every day | ORAL | 3 refills | Status: DC

## 2016-10-03 MED ORDER — UMECLIDINIUM-VILANTEROL 62.5-25 MCG/INH IN AEPB: 1.0000 | INHALATION_SPRAY | Freq: Every day | RESPIRATORY_TRACT | 11 refills | Status: DC

## 2016-10-03 NOTE — Telephone Encounter (Signed)
Called and spoke to pt. Pt states the Anoro has helped her breathing and is requesting an rx. Rx sent to preferred pharmacy. Pt verbalized understanding and denied any further questions or concerns at this time.

## 2016-10-06 ENCOUNTER — Ambulatory Visit: Payer: Medicare Other | Admitting: Podiatry

## 2016-10-12 ENCOUNTER — Other Ambulatory Visit: Payer: Self-pay

## 2016-10-12 ENCOUNTER — Other Ambulatory Visit: Payer: Self-pay | Admitting: Cardiovascular Disease

## 2016-10-12 MED ORDER — APIXABAN 5 MG PO TABS: ORAL_TABLET | ORAL | 5 refills | Status: DC

## 2016-10-12 NOTE — Telephone Encounter (Signed)
New message  Pharm calling for refill for Eliquis 5mg  1 tab 2X daily [facility]  30 day supply

## 2016-10-13 MED ORDER — APIXABAN 5 MG PO TABS: ORAL_TABLET | ORAL | 5 refills | Status: DC

## 2016-10-18 ENCOUNTER — Encounter: Payer: Self-pay | Admitting: Podiatry

## 2016-10-18 ENCOUNTER — Ambulatory Visit (INDEPENDENT_AMBULATORY_CARE_PROVIDER_SITE_OTHER): Payer: Medicare Other | Admitting: Podiatry

## 2016-10-18 DIAGNOSIS — E119 Type 2 diabetes mellitus without complications: Secondary | ICD-10-CM | POA: Diagnosis not present

## 2016-10-18 DIAGNOSIS — Q828 Other specified congenital malformations of skin: Secondary | ICD-10-CM

## 2016-10-18 NOTE — Progress Notes (Signed)
She presents today for follow-up of porokeratosis of the fifth digit of the right foot.  Objective: Vital signs are stable she is alert and oriented 3. Porokeratotic lesion medial aspect of an adductovarus rotated hammertoe deformity fifth right.  Assessment: Pain in limb secondary to porokeratosis fifth digit right foot.  Plan: Debridement of the lesion today recommended that she follow-up with her primary doctor for her pulmonary infection.

## 2016-10-21 ENCOUNTER — Ambulatory Visit (INDEPENDENT_AMBULATORY_CARE_PROVIDER_SITE_OTHER): Payer: Medicare Other | Admitting: Nurse Practitioner

## 2016-10-21 ENCOUNTER — Other Ambulatory Visit (INDEPENDENT_AMBULATORY_CARE_PROVIDER_SITE_OTHER): Payer: Medicare Other

## 2016-10-21 ENCOUNTER — Ambulatory Visit (INDEPENDENT_AMBULATORY_CARE_PROVIDER_SITE_OTHER)
Admission: RE | Admit: 2016-10-21 | Discharge: 2016-10-21 | Disposition: A | Discharge status: Home / Self Care | Payer: Medicare Other | Source: Ambulatory Visit | Attending: Nurse Practitioner | Admitting: Nurse Practitioner

## 2016-10-21 ENCOUNTER — Encounter: Payer: Self-pay | Admitting: Nurse Practitioner

## 2016-10-21 VITALS — BP 118/68 | HR 88 | Temp 98.7°F | Ht 63.0 in | Wt 192.0 lb

## 2016-10-21 DIAGNOSIS — Z0001 Encounter for general adult medical examination with abnormal findings: Secondary | ICD-10-CM | POA: Diagnosis not present

## 2016-10-21 DIAGNOSIS — J0140 Acute pansinusitis, unspecified: Secondary | ICD-10-CM

## 2016-10-21 DIAGNOSIS — Z780 Asymptomatic menopausal state: Secondary | ICD-10-CM

## 2016-10-21 DIAGNOSIS — N6321 Unspecified lump in the left breast, upper outer quadrant: Secondary | ICD-10-CM

## 2016-10-21 DIAGNOSIS — E559 Vitamin D deficiency, unspecified: Secondary | ICD-10-CM | POA: Insufficient documentation

## 2016-10-21 LAB — TSH: TSH: 0.17 u[IU]/mL — AB (ref 0.35–4.50)

## 2016-10-21 MED ORDER — GUAIFENESIN ER 600 MG PO TB12: 600.0000 mg | ORAL_TABLET | Freq: Two times a day (BID) | ORAL | 0 refills | Status: DC | PRN

## 2016-10-21 MED ORDER — FLUTICASONE PROPIONATE 50 MCG/ACT NA SUSP: 2.0000 | Freq: Every day | NASAL | 0 refills | Status: AC

## 2016-10-21 MED ORDER — AZITHROMYCIN 500 MG PO TABS: 500.0000 mg | ORAL_TABLET | Freq: Every day | ORAL | 0 refills | Status: DC

## 2016-10-21 NOTE — Progress Notes (Signed)
Pre visit review using our clinic review tool, if applicable. No additional management support is needed unless otherwise documented below in the visit note. 

## 2016-10-21 NOTE — Progress Notes (Signed)
Subjective:    Patient ID: Renee Ray, female    DOB: [birth year], 75 y.o.   MRN: [id number]  Patient presents today for complete physical or establish care (new patient) and congestion evaluation.  Sinusitis  This is a new problem. The current episode started 1 to 4 weeks ago. The problem has been gradually worsening since onset. Associated symptoms include congestion, coughing and sinus pressure. Pertinent negatives include no headaches, shortness of breath, sore throat or swollen glands. Past treatments include acetaminophen and oral decongestants. The treatment provided no relief.    Diabetes and Hypothyroidism: Managed by endocrinologist with [facility] physicians. Detailed foot exam done by endocrinologist. Urine microalbumin done by endocrinologist. 07/28/2016 last Hgb a1C 7.2 (done by endocrinologist with [facility] physicians, Dr. Deliah Goody) next OV 12/06/16. Records requested.  COPD: Followed by pulmonologist. Stable to with use of Anoro inhaler.  Htn, hyperlipidermia and Af-fib: Managed by cardiologist. Current used of eliquis. Denies any signs of bleeding.  CBC    Component Value Date/Time   WBC 8.9 09/22/2016 1116   RBC 4.62 09/22/2016 1116   HGB 13.8 09/22/2016 1116   HCT 41.6 09/22/2016 1116   PLT 305.0 09/22/2016 1116   MCV 90.1 09/22/2016 1116   MCV 71.8 (A) 08/13/2014 1538   MCH 29.2 12/02/2015 1056   MCHC 33.2 09/22/2016 1116   RDW 13.6 09/22/2016 1116   LYMPHSABS 1.3 09/22/2016 1116   MONOABS 0.9 09/22/2016 1116   EOSABS 0.2 09/22/2016 1116   BASOSABS 0.0 09/22/2016 1116    Lipid Panel     Component Value Date/Time   CHOL 181 12/02/2015 1056   TRIG 91 12/02/2015 1056   HDL 58 12/02/2015 1056   CHOLHDL 3.1 12/02/2015 1056   VLDL 18 12/02/2015 1056   LDLCALC 105 12/02/2015 1056   LDLDIRECT 165.6 12/16/2011 0942   BMP Latest Ref Rng & Units 09/22/2016 12/02/2015 10/21/2015  Glucose 70 - 99 mg/dL 276(H) 159(H) 94  BUN 6 - 23 mg/dL 28(H) 22 20    Creatinine 0.40 - 1.20 mg/dL 0.86 0.82 0.85  Sodium 135 - 145 mEq/L 139 141 142  Potassium 3.5 - 5.1 mEq/L 4.6 3.6 3.6  Chloride 96 - 112 mEq/L 100 103 104  CO2 19 - 32 mEq/L '30 29 26  '$ Calcium 8.4 - 10.5 mg/dL 10.2 9.7 10.0    No previous pcp, used urgent care for acute illnesses.  Immunizations: (TDAP, Hep C screen, Pneumovax, Influenza, zoster)  Health Maintenance  Topic Date Due  . Eye exam for diabetics  08/03/1950  . Tetanus Vaccine  08/04/1959  . Shingles Vaccine  08/03/2000  . DEXA scan (bone density measurement)  08/03/2005  . Hemoglobin A1C  11/08/2015  . Complete foot exam   04/23/2016  . Flu Shot  Completed  . Pneumonia vaccines  Completed   Diet: low sugar and low carb Weight:  Wt Readings from Last 3 Encounters:  10/21/16 192 lb (87.1 kg)  09/26/16 188 lb (85.3 kg)  09/22/16 191 lb 12.8 oz (87 kg)   Exercise:none Fall Risk: Fall Risk  10/21/2016 09/13/2016  Falls in the past year? No No   Home Safety: home alone Depression/Suicide: Depression screen [facility] 2/9 10/21/2016 09/26/2016 09/13/2016  Decreased Interest 0 0 0  Down, Depressed, Hopeless 0 0 0  PHQ - 2 Score 0 0 0  Some recent data might be hidden   No flowsheet data found. Colonoscopy (every 5-3yr, >50-771yr:last done 3years ago, normal per patient Dexa (every 2-5y44yr>65y32yreeded Pap Smear (every 40yrs32yr  for >21-29 without HPV, every 13yr for >30-640yrwith HPV):not needed, s/p hysterectomy due to endometriosis Mammogram (yearly, >4575yrdeclined Vision:up to date, records requested Dental:up to date, every 59mo63monthvanced Directive: Advanced Directives 03/02/2012  Does Patient Have a Medical Advance Directive? Patient has advance directive, copy not in chart  Type of Advance Directive [facility] will  Copy of [facility]? Copy requested from other (Comment)  Pre-existing out of facility DNR order (yellow form or pink MOST form) No   Sexual  History (birth control, marital status, STD):widow, 1daughter, step grandchildren  Medications and allergies reviewed with patient and updated if appropriate.  Patient Active Problem List   Diagnosis Date Noted  . Vitamin D deficiency 10/21/2016  . Mass of upper outer quadrant of left breast 10/21/2016  . Postmenopausal state 10/21/2016  . Pain of left calf 09/24/2016  . Osteoarthritis of both hands 08/30/2015  . Abnormal SPEP 08/30/2015  . Anemia, iron deficiency 10/16/2014  . Chronic anticoagulation 10/16/2014  . Iron deficiency anemia 10/09/2014  . Encounter for therapeutic drug monitoring 01/06/2014  . Upper respiratory infection 09/02/2013  . Heel spur 09/06/2012  . Acute medial meniscal tear 03/02/2012  . Hyperlipidemia 08/24/2011  . Benign hypertensive heart disease without heart failure 05/16/2011  . COPD mixed type (HCC)[place]/28/2010  . DIABETES MELLITUS 09/04/2008  . CARDIOMYOPATHY 09/04/2008  . ATRIAL FIBRILLATION 09/04/2008  . ASTHMATIC BRONCHITIS, ACUTE 09/04/2008    Current Outpatient Prescriptions on File Prior to Visit  Medication Sig Dispense Refill  . apixaban (ELIQUIS) 5 MG TABS tablet TAKE 1 TABLET (5 MG TOTAL) BY MOUTH 2 (TWO) TIMES DAILY. 60 tablet 5  . b complex vitamins capsule Take 1 capsule by mouth daily.     . BD PEN NEEDLE NANO U/F 32G X 4 MM MISC See admin instructions.  3  . calcium carbonate (OS-CAL) 600 MG TABS Take 600 mg by mouth daily.     . cholecalciferol (VITAMIN D) 1000 UNITS tablet Take 1,000 Units by mouth daily.     . ferrous sulfate 325 (65 FE) MG tablet Take 325 mg by mouth daily with breakfast.    . furosemide (LASIX) 40 MG tablet Take 1 tablet (40 mg total) by mouth daily. 90 tablet 3  . gabapentin (NEURONTIN) 100 MG capsule 1-2 at bedtime as needed - can take 1 up to tid prn. (Patient taking differently: Take 100 mg by mouth 5 (five) times daily. 1-2 at bedtime as needed - can take 1 up to tid prn.) 90 capsule 0  . glucose blood test  strip as directed.    . insulin glargine (LANTUS) 100 UNIT/ML injection Inject 40 Units into the skin at bedtime.     . LAMarland KitchenTUS SOLOSTAR 100 UNIT/ML Solostar Pen Inject 50 Units into the skin at bedtime. Pt report 38 unite subcutaneous    . levothyroxine (SYNTHROID, LEVOTHROID) 175 MCG tablet Take 175 mcg by mouth daily before breakfast.  5  . losartan-hydrochlorothiazide (HYZAAR) 100-25 MG tablet Take 1 tablet by mouth daily. 90 tablet 3  . metFORMIN (GLUCOPHAGE-XR) 500 MG 24 hr tablet Take 4 tablets by mouth every evening. Take with evening meal    . Multiple Vitamin (MULTIVITAMIN) tablet Take 1 tablet by mouth daily.     . Omega-3 Fatty Acids (FISH OIL PO) Take 1 tablet by mouth daily.     . potassium chloride SA (KLOR-CON M20) 20 MEQ tablet Take 1 tablet (20 mEq total) by mouth daily. 30 tablet  4  . PROAIR HFA 108 (90 BASE) MCG/ACT inhaler INHALE 2 PUFFS EVERY 4 HOURS AS NEEDED. 8.5 Inhaler 5  . rosuvastatin (CRESTOR) 5 MG tablet TAKE 1 TABLET BY MOUTH EVERY DAY 30 tablet 10  . traMADol (ULTRAM) 50 MG tablet Take 1 tablet (50 mg total) by mouth every 8 (eight) hours as needed. 20 tablet 0  . umeclidinium-vilanterol (ANORO ELLIPTA) 62.5-25 MCG/INH AEPB Inhale 1 puff into the lungs daily. 60 each 11  . Magnesium 200 MG TABS Take 200 mg by mouth daily.    . predniSONE (DELTASONE) 20 MG tablet Take 2 tablets daily with breakfast. (Patient not taking: Reported on 10/21/2016) 10 tablet 0   No current facility-administered medications on file prior to visit.     Past Medical History:  Diagnosis Date  . Abnormal echocardiogram 2010   showing slightly enlarged left atrium with normal LV function and normal PA pressures.   . Acute medial meniscal injury of knee    RIGHT KNEE  . Asthmatic bronchitis , chronic ([place])   . Atrial fibrillation ([place])   . Bruise    RIGHT ABD. DUE TO LOVENOX INJECTION  02-27-2012  . Chronic anticoagulation    coumadin  . Chronic atrial fibrillation ([place])     CARDIOLOGIST- DR BRACKBILL-- LAST VISIT  12-16-2011 IN [facility]  . Chronic renal insufficiency   . COPD, moderate ([place])    PULMOLOGIST- DR YOUNG --  LAST VISIT NOTE 02-23-2012 IN [facility]  . Diabetes mellitus    INSULIN AND ORAL MEDS  . Goiter 12/15   will have radiation in February  . History of pneumonia    associated w/  rhabdomyolysis  OCT 2012  . Hyperlipidemia   . Hypertension   . Non-ischemic cardiomyopathy ([place])    normal ef  . Normal nuclear stress test 2008  . OA (osteoarthritis) of knee    HANDS    Past Surgical History:  Procedure Laterality Date  . [place]--- BREAST IMPLANTS REMOVED   . KNEE ARTHROSCOPY  03/02/2012   Procedure: ARTHROSCOPY KNEE;  Surgeon: Gearlean Alf, MD;  Location: [facility];  Service: Orthopedics;  Laterality: Right;  WITH DEBRIDEMENT of medial menisces  . VAGINAL HYSTERECTOMY  1978    Social History   Social History  . Marital status: Widowed    Spouse name: N/A  . Number of children: 1  . Years of education: N/A   Occupational History  .      Part-Time at [employer] History Main Topics  . Smoking status: Former Smoker    Packs/day: 1.00    Years: 20.00    Types: Cigarettes    Quit date: 05/05/1996  . Smokeless tobacco: Never Used  . Alcohol use No  . Drug use: No  . Sexual activity: No   Other Topics Concern  . None   Social History Narrative  . None    Family History  Problem Relation Age of Onset  . Arrhythmia Mother   . Heart failure Mother   . Stroke Mother   . Heart attack Father   . Diabetes Brother   . Heart disease Brother         Review of Systems  Constitutional: Negative for fever, malaise/fatigue and weight loss.  HENT: Positive for congestion and sinus pressure. Negative for sore throat.   Eyes:       Negative for visual changes  Respiratory: Positive for cough. Negative for  shortness of breath.   Cardiovascular: Negative for chest pain,  palpitations and leg swelling.  Gastrointestinal: Negative for blood in stool, constipation, diarrhea and heartburn.  Genitourinary: Negative for dysuria, frequency and urgency.  Musculoskeletal: Negative for falls, joint pain and myalgias.  Skin: Negative for rash.  Neurological: Negative for dizziness, sensory change and headaches.  Endo/Heme/Allergies: Does not bruise/bleed easily.  Psychiatric/Behavioral: Negative for depression, substance abuse and suicidal ideas. The patient is not nervous/anxious.     Objective:   Vitals:   10/21/16 1358  BP: 118/68  Pulse: 88  Temp: 98.7 F (37.1 C)    Body mass index is 34.01 kg/m.   Physical Examination:  Physical Exam  Constitutional: She is oriented to person, place, and time and well-developed, well-nourished, and in no distress. No distress.  HENT:  Right Ear: Tympanic membrane, external ear and ear canal normal.  Left Ear: Tympanic membrane, external ear and ear canal normal.  Nose: Mucosal edema and rhinorrhea present. Right sinus exhibits maxillary sinus tenderness and frontal sinus tenderness. Left sinus exhibits maxillary sinus tenderness and frontal sinus tenderness.  Mouth/Throat: Uvula is midline. No trismus in the jaw. Posterior oropharyngeal erythema present. No oropharyngeal exudate.  Eyes: Conjunctivae and EOM are normal. Pupils are equal, round, and reactive to light. No scleral icterus.  Neck: Normal range of motion. Neck supple. No thyromegaly present.  Cardiovascular: Normal rate, normal heart sounds and intact distal pulses.   Pulmonary/Chest: Effort normal and breath sounds normal. She exhibits no tenderness. Right breast exhibits inverted nipple. Right breast exhibits no mass, no nipple discharge, no skin change and no tenderness. Left breast exhibits inverted nipple and mass. Left breast exhibits no nipple discharge, no skin change and no tenderness. Breasts are symmetrical.    Abdominal: Soft. Bowel sounds are  normal. She exhibits no distension. There is no tenderness.  Musculoskeletal: Normal range of motion. She exhibits edema. She exhibits no tenderness.  Trace bilateral ankle edema.  Lymphadenopathy:    She has no cervical adenopathy.  Neurological: She is alert and oriented to person, place, and time. She has normal reflexes. Gait normal.  Skin: Skin is warm and dry.  Psychiatric: Affect and judgment normal.    ASSESSMENT and PLAN:  Ranya was seen today for establish care.  Diagnoses and all orders for this visit:  Encounter for preventative adult health care exam with abnormal findings -     DG Bone Density; Future -     TSH; Future -     Vitamin D 1,25 dihydroxy  Postmenopausal state -     DG Bone Density; Future  Mass of upper outer quadrant of left breast -     MM Digital Diagnostic Unilat L; Future -     US BREAST LTD UNI LEFT INC AXILLA; Future -     MM Digital Diagnostic Bilat; Future  Vitamin D deficiency -     Vitamin D 1,25 dihydroxy  Acute non-recurrent pansinusitis -     azithromycin (ZITHROMAX) 500 MG tablet; Take 1 tablet (500 mg total) by mouth daily. -     guaiFENesin (MUCINEX) 600 MG 12 hr tablet; Take 1 tablet (600 mg total) by mouth 2 (two) times daily as needed. -     fluticasone (FLONASE) 50 MCG/ACT nasal spray; Place 2 sprays into both nostrils daily.   No problem-specific Assessment & Plan notes found for this encounter.    Recent Results (from the past 2160 hour(s))  CBC w/Diff     Status: None  Collection Time: 09/22/16 11:16 AM  Result Value Ref Range   WBC 8.9 4.0 - 10.5 K/uL   RBC 4.62 3.87 - 5.11 Mil/uL   Hemoglobin 13.8 12.0 - 15.0 g/dL   HCT 41.6 36.0 - 46.0 %   MCV 90.1 78.0 - 100.0 fl   MCHC 33.2 30.0 - 36.0 g/dL   RDW 13.6 11.5 - 15.5 %   Platelets 305.0 150.0 - 400.0 K/uL   Neutrophils Relative % 72.4 43.0 - 77.0 %   Lymphocytes Relative 15.1 12.0 - 46.0 %   Monocytes Relative 10.6 3.0 - 12.0 %   Eosinophils Relative 1.7 0.0 -  5.0 %   Basophils Relative 0.2 0.0 - 3.0 %   Neutro Abs 6.4 1.4 - 7.7 K/uL   Lymphs Abs 1.3 0.7 - 4.0 K/uL   Monocytes Absolute 0.9 0.1 - 1.0 K/uL   Eosinophils Absolute 0.2 0.0 - 0.7 K/uL   Basophils Absolute 0.0 0.0 - 0.1 K/uL  Basic Metabolic Panel (BMET)     Status: Abnormal   Collection Time: 09/22/16 11:16 AM  Result Value Ref Range   Sodium 139 135 - 145 mEq/L   Potassium 4.6 3.5 - 5.1 mEq/L   Chloride 100 96 - 112 mEq/L   CO2 30 19 - 32 mEq/L   Glucose, Bld 276 (H) 70 - 99 mg/dL   BUN 28 (H) 6 - 23 mg/dL   Creatinine, Ser 0.86 0.40 - 1.20 mg/dL   Calcium 10.2 8.4 - 10.5 mg/dL   GFR 68.16 >60.00 mL/min  Hepatic function panel     Status: None   Collection Time: 09/22/16 11:16 AM  Result Value Ref Range   Total Bilirubin 0.5 0.2 - 1.2 mg/dL   Bilirubin, Direct 0.1 0.0 - 0.3 mg/dL   Alkaline Phosphatase 79 39 - 117 U/L   AST 16 0 - 37 U/L   ALT 25 0 - 35 U/L   Total Protein 6.7 6.0 - 8.3 g/dL   Albumin 4.1 3.5 - 5.2 g/dL  D-Dimer, Quantitative     Status: None   Collection Time: 09/22/16 11:16 AM  Result Value Ref Range   D-Dimer, Quant 0.23 <0.50 mcg/mL FEU    Comment:   The D-Dimer test is used frequently to exclude an acute PE or DVT.  In patients with a low to moderate clinical risk assessment and a D-Dimer result <0.50 mcg/mL FEU, the likelihood of a PE or DVT is very low.  However, a thromboembolic event should not be excluded solely on the basis of the D-Dimer level.  Increased levels of D-Dimer are associated with a PE, DVT, DIC, malignancies, inflammation, sepsis, surgery, trauma, pregnancy, and advancing patient age. [Jama 2006 11:295(2): 102-585]   For additional information, please refer to: http://education.[facility].com/faq/FAQ149 (This link is being provided for information/ educational purposes only)     Sed Rate (ESR)     Status: None   Collection Time: 09/22/16 11:16 AM  Result Value Ref Range   Sed Rate 26 0 - 30 mm/hr   Follow  up: Return in about 6 months (around 04/21/2017) for hyperlipidemia, DM and HTN, left breast mass.  Wilfred Lacy, NP

## 2016-10-25 ENCOUNTER — Other Ambulatory Visit: Payer: Self-pay | Admitting: Nurse Practitioner

## 2016-10-25 DIAGNOSIS — E039 Hypothyroidism, unspecified: Secondary | ICD-10-CM

## 2016-10-25 MED ORDER — LEVOTHYROXINE SODIUM 137 MCG PO TABS: 137.0000 ug | ORAL_TABLET | Freq: Every day | ORAL | 3 refills | Status: DC

## 2016-10-25 NOTE — Progress Notes (Signed)
Tsh of 0.17. Decreased synthroid dose from 175 to 117mcg. Repeat Thyroid function in 6weeks.

## 2016-10-27 ENCOUNTER — Telehealth: Payer: Self-pay | Admitting: Nurse Practitioner

## 2016-10-27 ENCOUNTER — Other Ambulatory Visit: Payer: Self-pay | Admitting: Internal Medicine

## 2016-10-27 NOTE — Telephone Encounter (Signed)
Patient called in.  Gave Charlottes response on labs.  Patient understands to get new script and go back to lab in 6 weeks.

## 2016-10-28 ENCOUNTER — Ambulatory Visit
Admission: RE | Admit: 2016-10-28 | Discharge: 2016-10-28 | Disposition: A | Discharge status: Home / Self Care | Payer: Medicare Other | Source: Ambulatory Visit | Attending: Nurse Practitioner | Admitting: Nurse Practitioner

## 2016-10-28 DIAGNOSIS — N6321 Unspecified lump in the left breast, upper outer quadrant: Secondary | ICD-10-CM

## 2016-11-02 NOTE — Progress Notes (Signed)
Normal results, see office note

## 2016-11-24 ENCOUNTER — Ambulatory Visit: Payer: Medicare Other | Admitting: Podiatry

## 2016-11-29 ENCOUNTER — Ambulatory Visit: Payer: Medicare Other | Admitting: Podiatry

## 2016-12-08 LAB — CBC
HEMATOCRIT: 38.5 % (ref 35.0–45.0)
Hemoglobin: 12.6 g/dL (ref 11.7–15.5)
MCH: 29.7 pg (ref 27.0–33.0)
MCHC: 32.7 g/dL (ref 32.0–36.0)
MCV: 90.8 fL (ref 80.0–100.0)
MPV: 9.7 fL (ref 7.5–12.5)
PLATELETS: 297 10*3/uL (ref 140–400)
RBC: 4.24 MIL/uL (ref 3.80–5.10)
RDW: 13.9 % (ref 11.0–15.0)
WBC: 5.6 10*3/uL (ref 3.8–10.8)

## 2016-12-12 ENCOUNTER — Ambulatory Visit (INDEPENDENT_AMBULATORY_CARE_PROVIDER_SITE_OTHER): Payer: Medicare Other | Admitting: Cardiovascular Disease

## 2016-12-12 ENCOUNTER — Encounter: Payer: Self-pay | Admitting: Cardiovascular Disease

## 2016-12-12 VITALS — BP 120/70 | HR 85 | Ht 63.0 in | Wt 196.4 lb

## 2016-12-12 DIAGNOSIS — I482 Chronic atrial fibrillation, unspecified: Secondary | ICD-10-CM

## 2016-12-12 DIAGNOSIS — D509 Iron deficiency anemia, unspecified: Secondary | ICD-10-CM

## 2016-12-12 DIAGNOSIS — I119 Hypertensive heart disease without heart failure: Secondary | ICD-10-CM | POA: Diagnosis not present

## 2016-12-12 DIAGNOSIS — E1169 Type 2 diabetes mellitus with other specified complication: Secondary | ICD-10-CM | POA: Diagnosis not present

## 2016-12-12 DIAGNOSIS — E782 Mixed hyperlipidemia: Secondary | ICD-10-CM | POA: Diagnosis not present

## 2016-12-12 DIAGNOSIS — Z7901 Long term (current) use of anticoagulants: Secondary | ICD-10-CM

## 2016-12-12 DIAGNOSIS — E669 Obesity, unspecified: Secondary | ICD-10-CM

## 2016-12-12 NOTE — Progress Notes (Signed)
Patient ID: Renee Ray, female   DOB: 04-04-1941, 76 y.o.   MRN: [id number]    Cardiology Office Note    Date:  12/12/2016   ID:  Renee Ray, DOB [birth year], MRN [id number]  PCP:  Wilfred Lacy, NP  Cardiologist:   Sanda Klein, MD   Chief Complaint  Patient presents with  . Follow-up    History of Present Illness:  Renee Ray is a 76 y.o. female with long-standing persistent atrial fibrillation.  She has asymptomatic atrial fibrillation that is well rate controlled without the need for AV blocking medications. She is on chronic anticoagulation with Eliquis, without any serious bleeding complications.  She did have iron deficiency anemia but an extensive endoscopic workup by Dr. Olevia Perches (EGD, capsule enteroscopy, colonoscopy) was negative. She takes iron supplements. She has diabetes mellitus, followed by Dr. Buddy Duty. She has treated hypertension with some evidence of left ventricular hypertrophy, but without overt heart failure. She does take a low dose of diuretic daily for lower extremity edema.  She has had some problems with left-sided sciatica for the last 3 months, but these are improving. The leg pain has actually resolved, she still has some low back discomfort. Still works part-time at [employer].  She still exercises twice weekly without any complaints of shortness of breath, dizziness, syncope. She has not had any overt serious bleeding problems. She denies leg edema or focal neurological events.  Past Medical History:  Diagnosis Date  . Abnormal echocardiogram 2010   showing slightly enlarged left atrium with normal LV function and normal PA pressures.   . Acute medial meniscal injury of knee    RIGHT KNEE  . Asthmatic bronchitis , chronic ([place])   . Atrial fibrillation ([place])   . Bruise    RIGHT ABD. DUE TO LOVENOX INJECTION  02-27-2012  . Chronic anticoagulation    coumadin  . Chronic atrial fibrillation ([place])    CARDIOLOGIST- DR BRACKBILL-- LAST  VISIT  12-16-2011 IN [facility]  . Chronic renal insufficiency   . COPD, moderate ([place])    PULMOLOGIST- DR YOUNG --  LAST VISIT NOTE 02-23-2012 IN [facility]  . Diabetes mellitus    INSULIN AND ORAL MEDS  . Goiter 12/15   will have radiation in February  . History of pneumonia    associated w/  rhabdomyolysis  OCT 2012  . Hyperlipidemia   . Hypertension   . Non-ischemic cardiomyopathy ([place])    normal ef  . Normal nuclear stress test 2008  . OA (osteoarthritis) of knee    HANDS    Past Surgical History:  Procedure Laterality Date  . [place]--- BREAST IMPLANTS REMOVED   . KNEE ARTHROSCOPY  03/02/2012   Procedure: ARTHROSCOPY KNEE;  Surgeon: Gearlean Alf, MD;  Location: [facility];  Service: Orthopedics;  Laterality: Right;  WITH DEBRIDEMENT of medial menisces  . VAGINAL HYSTERECTOMY  1978    Current Medications: Outpatient Medications Prior to Visit  Medication Sig Dispense Refill  . apixaban (ELIQUIS) 5 MG TABS tablet TAKE 1 TABLET (5 MG TOTAL) BY MOUTH 2 (TWO) TIMES DAILY. 60 tablet 5  . b complex vitamins capsule Take 1 capsule by mouth daily.     . BD PEN NEEDLE NANO U/F 32G X 4 MM MISC See admin instructions.  3  . calcium carbonate (OS-CAL) 600 MG TABS Take 600 mg by mouth daily.     . cholecalciferol (VITAMIN D) 1000 UNITS tablet Take 1,000  Units by mouth daily.     . fluticasone (FLONASE) 50 MCG/ACT nasal spray Place 2 sprays into both nostrils daily. (Patient taking differently: Place 2 sprays into both nostrils as needed. ) 16 g 0  . furosemide (LASIX) 40 MG tablet Take 1 tablet (40 mg total) by mouth daily. 90 tablet 3  . gabapentin (NEURONTIN) 100 MG capsule 1-2 at bedtime as needed - can take 1 up to tid prn. (Patient taking differently: Take 100 mg by mouth 6 (six) times daily. 2 IN THE MORNING 1 MID DAY AND 3 AT NIGHT TIME) 90 capsule 0  . glucose blood test strip as directed.    [name] guaiFENesin (MUCINEX) 600 MG 12 hr tablet  Take 1 tablet (600 mg total) by mouth 2 (two) times daily as needed. (Patient taking differently: Take 600 mg by mouth as needed. ) 14 tablet 0  . insulin glargine (LANTUS) 100 UNIT/ML injection Inject 40 Units into the skin at bedtime.     [name] levothyroxine (SYNTHROID, LEVOTHROID) 137 MCG tablet Take 1 tablet (137 mcg total) by mouth daily before breakfast. 30 tablet 3  . losartan-hydrochlorothiazide (HYZAAR) 100-25 MG tablet Take 1 tablet by mouth daily. 90 tablet 3  . metFORMIN (GLUCOPHAGE-XR) 500 MG 24 hr tablet Take 4 tablets by mouth every evening. Take with evening meal    . Multiple Vitamin (MULTIVITAMIN) tablet Take 1 tablet by mouth daily.     . Omega-3 Fatty Acids (FISH OIL PO) Take 1 tablet by mouth daily.     . potassium chloride SA (KLOR-CON M20) 20 MEQ tablet Take 1 tablet (20 mEq total) by mouth daily. 30 tablet 4  . PROAIR HFA 108 (90 Base) MCG/ACT inhaler INHALE 2 PUFFS EVERY 4 HOURS AS NEEDED. 8.5 Inhaler 4  . rosuvastatin (CRESTOR) 5 MG tablet TAKE 1 TABLET BY MOUTH EVERY DAY 30 tablet 10  . traMADol (ULTRAM) 50 MG tablet Take 1 tablet (50 mg total) by mouth every 8 (eight) hours as needed. 20 tablet 0  . umeclidinium-vilanterol (ANORO ELLIPTA) 62.5-25 MCG/INH AEPB Inhale 1 puff into the lungs daily. 60 each 11  . azithromycin (ZITHROMAX) 500 MG tablet Take 1 tablet (500 mg total) by mouth daily. (Patient not taking: Reported on 12/12/2016) 5 tablet 0  . ferrous sulfate 325 (65 FE) MG tablet Take 325 mg by mouth daily with breakfast.    . LANTUS SOLOSTAR 100 UNIT/ML Solostar Pen Inject 50 Units into the skin at bedtime. Pt report 38 unite subcutaneous    . Magnesium 200 MG TABS Take 200 mg by mouth daily.    . predniSONE (DELTASONE) 20 MG tablet Take 2 tablets daily with breakfast. (Patient not taking: Reported on 10/21/2016) 10 tablet 0   No facility-administered medications prior to visit.      Allergies:   Advil [ibuprofen]; Janumet [sitagliptin-metformin hcl]; Amoxicillin;  and Sulfonamide derivatives   Social History   Social History  . Marital status: Widowed    Spouse name: N/A  . Number of children: 1  . Years of education: N/A   Occupational History  .      Part-Time at [employer] History Main Topics  . Smoking status: Former Smoker    Packs/day: 1.00    Years: 20.00    Types: Cigarettes    Quit date: 05/05/1996  . Smokeless tobacco: Never Used  . Alcohol use No  . Drug use: No  . Sexual activity: No   Other Topics Concern  . None  Social History Narrative  . None     Family History:  The patient's family history includes Arrhythmia in her mother; Diabetes in her brother; Heart attack in her father; Heart disease in her brother; Heart failure in her mother; Stroke in her mother.   ROS:   Please see the history of present illness.    ROS All other systems reviewed and are negative.   PHYSICAL EXAM:   VS:  BP 120/70 (BP Location: Left Arm, Patient Position: Sitting, Cuff Size: Normal)   Pulse 85   Ht 5\' 3"  (1.6 m)   Wt 89.1 kg (196 lb 6.4 oz)   BMI 34.79 kg/m    GEN: Well nourished, well developed, in no acute distress  HEENT: normal  Neck: no JVD, carotid bruits, or masses Cardiac: irregular; no murmurs, rubs, or gallops,no edema  Respiratory:  clear to auscultation bilaterally, normal work of breathing GI: soft, nontender, nondistended, + BS MS: no deformity or atrophy  Skin: warm and dry, no rash Neuro:  Alert and Oriented x 3, Strength and sensation are intact Psych: euthymic mood, full affect  Wt Readings from Last 3 Encounters:  12/12/16 89.1 kg (196 lb 6.4 oz)  10/21/16 87.1 kg (192 lb)  09/26/16 85.3 kg (188 lb)      Studies/Labs Reviewed:   EKG:  EKG is ordered today.  The ekg ordered today demonstrates Atrial fibrillation with controlled rate, incomplete right bundle branch block (QRS approx. 100 ms), QTC 459 ms  Recent Labs: 09/22/2016: ALT 25; BUN 28; Creatinine, Ser 0.86; Potassium 4.6;  Sodium 139 10/21/2016: TSH 0.17 12/08/2016: Hemoglobin 12.6; Platelets 297   Lipid Panel    Component Value Date/Time   CHOL 181 12/02/2015 1056   TRIG 91 12/02/2015 1056   HDL 58 12/02/2015 1056   CHOLHDL 3.1 12/02/2015 1056   VLDL 18 12/02/2015 1056   LDLCALC 105 12/02/2015 1056   LDLDIRECT 165.6 12/16/2011 0942    Additional studies/ records that were reviewed today include:  Additional labs from 12/02/2016 show hemoglobin A1c 7.7%, TSH 1.68, creatinine 0.8, potassium 4.2, normal liver function tests. No evidence of microalbuminuria. Total cholesterol 176, triglycerides 111, HDL 63, LDL 91    ASSESSMENT:    1. Chronic atrial fibrillation ([place])   2. Benign hypertensive heart disease without heart failure   3. Mixed hyperlipidemia   4. Diabetes mellitus type 2 in obese ([place])   5. Iron deficiency anemia, unspecified iron deficiency anemia type   6. Chronic anticoagulation      PLAN:  In order of problems listed above:  1. AFIB, permanent: Asymptomatic, on appropriate anticoagulation. CHADSVasc 5 (age 58, HTN, DM, gender). She does not require any medication for rate control, suggesting some degree of cardiac conduction system disease.  2. HTN: well controlled. Echo in 2015 showed mild left ventricular hypertrophy and normal left ventricular systolic function. The report was interpreted as showing pseudo-normal filling, but I think she was in atrial fibrillation when that echo was performed. No clinical evidence of diastolic heart failure. 3. HLP: In the absence of known vascular disease, target LDL less than 100 mg/deciliter, at goal. 4. DM: She has discussed glucose management with her endocrinologist and the current level is felt to be acceptable. She does not have any evidence of end organ involvement. 5. Anemia: Etiology of iron deficiency is unclear after a very benign extensive GI workup. Continue iron supplement 6. Anticoagulation is otherwise well tolerated without  complications. Okay to stop anticoagulation for a couple of  days if she requires back injection for sciatica.    Medication Adjustments/Labs and Tests Ordered: Current medicines are reviewed at length with the patient today.  Concerns regarding medicines are outlined above.  Medication changes, Labs and Tests ordered today are listed in the Patient Instructions below. Patient Instructions  Dr Sallyanne Kuster recommends that you schedule a follow-up appointment in 1 year. You will receive a reminder letter in the mail two months in advance. If you don't receive a letter, please call our office to schedule the follow-up appointment.  If you need a refill on your cardiac medications before your next appointment, please call your pharmacy.    Signed, Sanda Klein, MD  12/12/2016 9:51 AM    [facility] [place], [place], [place]  [address] Phone: [phone]; Fax: [phone]

## 2016-12-12 NOTE — Patient Instructions (Signed)
Dr Croitoru recommends that you schedule a follow-up appointment in 1 year. You will receive a reminder letter in the mail two months in advance. If you don't receive a letter, please call our office to schedule the follow-up appointment.  If you need a refill on your cardiac medications before your next appointment, please call your pharmacy. 

## 2016-12-13 ENCOUNTER — Telehealth: Payer: Self-pay | Admitting: *Deleted

## 2016-12-13 ENCOUNTER — Encounter: Payer: Self-pay | Admitting: Podiatry

## 2016-12-13 ENCOUNTER — Ambulatory Visit (INDEPENDENT_AMBULATORY_CARE_PROVIDER_SITE_OTHER): Payer: Medicare Other | Admitting: Podiatry

## 2016-12-13 DIAGNOSIS — E119 Type 2 diabetes mellitus without complications: Secondary | ICD-10-CM

## 2016-12-13 DIAGNOSIS — M2041 Other hammer toe(s) (acquired), right foot: Secondary | ICD-10-CM

## 2016-12-13 DIAGNOSIS — Q828 Other specified congenital malformations of skin: Secondary | ICD-10-CM | POA: Diagnosis not present

## 2016-12-13 NOTE — Progress Notes (Signed)
She presents today to complaint of a painful corn, fifth digit right foot.  Objective: Pulses remain palpable reactive hyperkeratosis medial aspect fifth digit right hammertoe deformity right.  Pain: Porokeratosis fifth digit right.  Plan: Mechanical debridement today with padding. Follow up in 2 months.

## 2016-12-13 NOTE — Telephone Encounter (Signed)
What is medication for?

## 2016-12-13 NOTE — Telephone Encounter (Signed)
Pt left msg on triage stating she was rx some triamcinolone cream by her previous MD wanting to know can she [facility].../lmb

## 2016-12-14 MED ORDER — TRIAMCINOLONE ACETONIDE 0.1 % EX CREA: 1.0000 "application " | TOPICAL_CREAM | Freq: Two times a day (BID) | CUTANEOUS | 0 refills | Status: DC | PRN

## 2016-12-14 NOTE — Telephone Encounter (Signed)
Called pt to get verify % for the cream. [facility].../lmb

## 2016-12-14 NOTE — Telephone Encounter (Signed)
Called pt she states she tends to get a rash/irritation around her groin area. Her previous MD stated because she get a lot of moisture there, and gave her prescription for the cream.../lmb

## 2016-12-14 NOTE — Telephone Encounter (Signed)
Ok to refill medication 

## 2017-01-24 ENCOUNTER — Encounter: Payer: Self-pay | Admitting: Podiatry

## 2017-01-24 ENCOUNTER — Ambulatory Visit (INDEPENDENT_AMBULATORY_CARE_PROVIDER_SITE_OTHER): Payer: Medicare Other | Admitting: Podiatry

## 2017-01-24 DIAGNOSIS — Q828 Other specified congenital malformations of skin: Secondary | ICD-10-CM | POA: Diagnosis not present

## 2017-01-24 NOTE — Progress Notes (Signed)
She presents today chief complaint of pain to the fifth digit of the right foot where she retains a porokeratosis.  Objective: No change in pulses. Porokeratosis dorsal aspect of the fifth digit right foot.  Assessment: Porokeratosis distal medial aspect of the fifth digit right foot.  Plan: Debridement of reactive hyperkeratosis placed padding and will follow-up with her in 1 month.

## 2017-02-03 ENCOUNTER — Other Ambulatory Visit: Payer: Self-pay

## 2017-02-03 MED ORDER — POTASSIUM CHLORIDE CRYS ER 20 MEQ PO TBCR
20.0000 meq | EXTENDED_RELEASE_TABLET | Freq: Every day | ORAL | 3 refills | Status: DC
Start: 2017-02-03 — End: 2018-01-18

## 2017-02-21 ENCOUNTER — Telehealth: Payer: Self-pay | Admitting: Nurse Practitioner

## 2017-02-21 DIAGNOSIS — E039 Hypothyroidism, unspecified: Secondary | ICD-10-CM

## 2017-02-21 MED ORDER — LEVOTHYROXINE SODIUM 137 MCG PO TABS: 137.0000 ug | ORAL_TABLET | Freq: Every day | ORAL | 0 refills | Status: DC

## 2017-02-21 NOTE — Telephone Encounter (Signed)
Pt needs a refill of levothyroxine (SYNTHROID, LEVOTHROID) 137 MCG tablet Does the Pt need an appt?noit

## 2017-02-21 NOTE — Telephone Encounter (Signed)
[facility], keep up coming appt/per [place].

## 2017-02-28 ENCOUNTER — Encounter: Payer: Self-pay | Admitting: Podiatry

## 2017-02-28 ENCOUNTER — Ambulatory Visit (INDEPENDENT_AMBULATORY_CARE_PROVIDER_SITE_OTHER): Payer: Medicare Other | Admitting: Podiatry

## 2017-02-28 DIAGNOSIS — Q828 Other specified congenital malformations of skin: Secondary | ICD-10-CM | POA: Diagnosis not present

## 2017-02-28 NOTE — Progress Notes (Signed)
She presents today for follow-up of her fifth toe medial side. She states that the calluses returned.  Objective: Vital signs are stable she is alert and oriented 3. Pulses are palpable. No signs of infection today reactive hyperkeratosis on overlying the medial aspect of the DIPJ fifth digit right foot secondary to adductovarus rotated hammertoe deformity.  Assessment: Adductovarus rotated hammertoe deformity with porokeratosis right foot.  Plan: Debrided reactive hyperkeratotic lesion she will follow up with me on 1 month.

## 2017-03-19 ENCOUNTER — Other Ambulatory Visit: Payer: Self-pay | Admitting: Cardiovascular Disease

## 2017-03-20 NOTE — Telephone Encounter (Signed)
Rx(s) sent to pharmacy electronically.  

## 2017-04-04 ENCOUNTER — Ambulatory Visit: Payer: Medicare Other | Admitting: Podiatry

## 2017-04-04 ENCOUNTER — Ambulatory Visit (INDEPENDENT_AMBULATORY_CARE_PROVIDER_SITE_OTHER): Payer: Medicare Other | Admitting: Podiatry

## 2017-04-04 ENCOUNTER — Encounter: Payer: Self-pay | Admitting: Podiatry

## 2017-04-04 DIAGNOSIS — Q828 Other specified congenital malformations of skin: Secondary | ICD-10-CM

## 2017-04-04 DIAGNOSIS — M2041 Other hammer toe(s) (acquired), right foot: Secondary | ICD-10-CM

## 2017-04-04 NOTE — Progress Notes (Signed)
She presents today for routine debridement of her porokeratosis fifth digit right foot. She states it has not been as painful this month.  Objective: Vital signs are stable alert and oriented 3. Pulses are palpable. Neurologic sensorium is intact. Reflexes are intact. Porokeratosis medial aspect of an adductor varus rotated hammertoe deformity fifth right.  Assessment: Porokeratosis right.  Plan: Sharp debridement of the lesion today placed padding.

## 2017-04-21 ENCOUNTER — Encounter: Payer: Self-pay | Admitting: Nurse Practitioner

## 2017-04-21 ENCOUNTER — Ambulatory Visit (INDEPENDENT_AMBULATORY_CARE_PROVIDER_SITE_OTHER): Payer: Medicare Other | Admitting: Nurse Practitioner

## 2017-04-21 VITALS — BP 118/78 | HR 79 | Temp 98.1°F | Ht 63.0 in | Wt 192.0 lb

## 2017-04-21 DIAGNOSIS — E118 Type 2 diabetes mellitus with unspecified complications: Secondary | ICD-10-CM

## 2017-04-21 DIAGNOSIS — E782 Mixed hyperlipidemia: Secondary | ICD-10-CM

## 2017-04-21 DIAGNOSIS — E039 Hypothyroidism, unspecified: Secondary | ICD-10-CM

## 2017-04-21 DIAGNOSIS — B372 Candidiasis of skin and nail: Secondary | ICD-10-CM | POA: Diagnosis not present

## 2017-04-21 DIAGNOSIS — D509 Iron deficiency anemia, unspecified: Secondary | ICD-10-CM | POA: Diagnosis not present

## 2017-04-21 DIAGNOSIS — Z794 Long term (current) use of insulin: Secondary | ICD-10-CM

## 2017-04-21 DIAGNOSIS — I119 Hypertensive heart disease without heart failure: Secondary | ICD-10-CM | POA: Diagnosis not present

## 2017-04-21 MED ORDER — PEN NEEDLES 31G X 6 MM MISC: 1.0000 [IU] | Freq: Every day | 3 refills | Status: DC

## 2017-04-21 MED ORDER — PEN NEEDLES 31G X 6 MM MISC: 1.0000 [IU] | Freq: Every day | 3 refills | Status: AC

## 2017-04-21 MED ORDER — TRIAMCINOLONE ACETONIDE 0.1 % EX CREA: 1.0000 "application " | TOPICAL_CREAM | Freq: Two times a day (BID) | CUTANEOUS | 1 refills | Status: DC | PRN

## 2017-04-21 MED ORDER — MICONAZOLE NITRATE 2 % EX POWD: CUTANEOUS | 0 refills | Status: AC | PRN

## 2017-04-21 NOTE — Patient Instructions (Addendum)
Please have Dr. Cindra Eves office fax over next lab results.  Inform him about nocturnal hypoglycemia episodes. Inform him about instructions to decrease metformin to 2tabs once a day.  Check glucose before breakfast and at bedtime.  Please bring report of opthalmology report.  Hypoglycemia Hypoglycemia occurs when the level of sugar (glucose) in the blood is too low. Glucose is a type of sugar that provides the body's main source of energy. Certain hormones (insulin and glucagon) control the level of glucose in the blood. Insulin lowers blood glucose, and glucagon increases blood glucose. Hypoglycemia can result from having too much insulin in the bloodstream, or from not eating enough food that contains glucose. Hypoglycemia can happen in people who do or do not have diabetes. It can develop quickly, and it can be a medical emergency. What are the causes? Hypoglycemia occurs most often in people who have diabetes. If you have diabetes, hypoglycemia may be caused by:  Diabetes medicine.  Not eating enough, or not eating often enough.  Increased physical activity.  Drinking alcohol, especially when you have not eaten recently.  If you do not have diabetes, hypoglycemia may be caused by:  A tumor in the pancreas. The pancreas is the organ that makes insulin.  Not eating enough, or not eating for long periods at a time (fasting).  Severe infection or illness that affects the liver, heart, or kidneys.  Certain medicines.  You may also have reactive hypoglycemia. This condition causes hypoglycemia within 4 hours of eating a meal. This may occur after having stomach surgery. Sometimes, the cause of reactive hypoglycemia is not known. What increases the risk? Hypoglycemia is more likely to develop in:  People who have diabetes and take medicines to lower blood glucose.  People who abuse alcohol.  People who have a severe illness.  What are the signs or symptoms? Hypoglycemia may not  cause any symptoms. If you have symptoms, they may include:  Hunger.  Anxiety.  Sweating and feeling clammy.  Confusion.  Dizziness or feeling light-headed.  Sleepiness.  Nausea.  Increased heart rate.  Headache.  Blurry vision.  Seizure.  Nightmares.  Tingling or numbness around the mouth, lips, or tongue.  A change in speech.  Decreased ability to concentrate.  A change in coordination.  Restless sleep.  Tremors or shakes.  Fainting.  Irritability.  How is this diagnosed? Hypoglycemia is diagnosed with a blood test to measure your blood glucose level. This blood test is done while you are having symptoms. Your health care provider may also do a physical exam and review your medical history. If you do not have diabetes, other tests may be done to find the cause of your hypoglycemia. How is this treated? This condition can often be treated by immediately eating or drinking something that contains glucose, such as:  3-4 sugar tablets (glucose pills).  Glucose gel, 15-gram tube.  Fruit juice, 4 oz (120 mL).  Regular soda (not diet soda), 4 oz (120 mL).  Low-fat milk, 4 oz (120 mL).  Several pieces of hard candy.  Sugar or honey, 1 Tbsp.  Treating Hypoglycemia If You Have Diabetes  If you are alert and able to swallow safely, follow the 15:15 rule:  Take 15 grams of a rapid-acting carbohydrate. Rapid-acting options include: ? 1 tube of glucose gel. ? 3 glucose pills. ? 6-8 pieces of hard candy. ? 4 oz (120 mL) of fruit juice. ? 4 oz (120 ml) of regular (not diet) soda.  Check your blood  glucose 15 minutes after you take the carbohydrate.  If the repeat blood glucose level is still at or below 70 mg/dL (3.9 mmol/L), take 15 grams of a carbohydrate again.  If your blood glucose level does not increase above 70 mg/dL (3.9 mmol/L) after 3 tries, seek emergency medical care.  After your blood glucose level returns to normal, eat a meal or a snack  within 1 hour.  Treating Severe Hypoglycemia Severe hypoglycemia is when your blood glucose level is at or below 54 mg/dL (3 mmol/L). Severe hypoglycemia is an emergency. Do not wait to see if the symptoms will go away. Get medical help right away. Call your local emergency services (911 in the [place] Do not drive yourself to the hospital. If you have severe hypoglycemia and you cannot eat or drink, you may need an injection of glucagon. A family member or close friend should learn how to check your blood glucose and how to give you a glucagon injection. Ask your health care provider if you need to have an emergency glucagon injection kit available. Severe hypoglycemia may need to be treated in a hospital. The treatment may include getting glucose through an IV tube. You may also need treatment for the cause of your hypoglycemia. Follow these instructions at home: General instructions  Avoid any diets that cause you to not eat enough food. Talk with your health care provider before you start any new diet.  Take over-the-counter and prescription medicines only as told by your health care provider.  Limit alcohol intake to no more than 1 drink per day for nonpregnant women and 2 drinks per day for men. One drink equals 12 oz of beer, 5 oz of wine, or 1 oz of hard liquor.  Keep all follow-up visits as told by your health care provider. This is important. If You Have Diabetes:   Make sure you know the symptoms of hypoglycemia.  Always have a rapid-acting carbohydrate snack with you to treat low blood sugar.  Follow your diabetes management plan, as told by your health care provider. Make sure you: ? Take your medicines as directed. ? Follow your exercise plan. ? Follow your meal plan. Eat on time, and do not skip meals. ? Check your blood glucose as often as directed. Make sure to check your blood glucose before and after exercise. If you exercise longer or in a different way than usual,  check your blood glucose more often. ? Follow your sick day plan whenever you cannot eat or drink normally. Make this plan in advance with your health care provider.  Share your diabetes management plan with people in your workplace, school, and household.  Check your urine for ketones when you are ill and as told by your health care provider.  Carry a medical alert card or wear medical alert jewelry. If You Have Reactive Hypoglycemia or Low Blood Sugar From Other Causes:  Monitor your blood glucose as told by your health care provider.  Follow instructions from your health care provider about eating or drinking restrictions. Contact a health care provider if:  You have problems keeping your blood glucose in your target range.  You have frequent episodes of hypoglycemia. Get help right away if:  You continue to have hypoglycemia symptoms after eating or drinking something containing glucose.  Your blood glucose is at or below 54 mg/dL (3 mmol/L).  You have a seizure.  You faint. These symptoms may represent a serious problem that is an emergency. Do not  wait to see if the symptoms will go away. Get medical help right away. Call your local emergency services (911 in the [place] Do not drive yourself to the hospital. This information is not intended to replace advice given to you by your health care provider. Make sure you discuss any questions you have with your health care provider. Document Released: 10/24/2005 Document Revised: 04/06/2016 Document Reviewed: 11/27/2015 Elsevier Interactive Patient Education  [employer].

## 2017-04-21 NOTE — Progress Notes (Signed)
Subjective:  Patient ID: Renee Ray, female    DOB: [birth year]  Age: 76 y.o. MRN: [id number]  CC: Follow-up (6 mo fu--kenalog cream refill? had shingrix inj yesterday---tdap?---BD PEN NEEDLE NANO--CHANGE TO 32G X 6MM? [facility])   HPI  DM: Home glucose ranges from 54-276. Report 3 episodes of nocturnal hypoglycemia: diaphoresis and confusion. No morning hyperglycemia after hypoglycemic episode. Takes 4tabs of metformin with supper and lantus 38unit at HS. Followed by Dr. Buddy Duty. Upcoming appt 06/2017. She will like longer and bigger pen needle. Current needles are too short and bend easily.  HTN: Stable with hyzaar. Controlled.  COPD: Controlled with albuterol and anoro.  A-Fib: Controlled rate Current use of eliquis, and lasix. Managed by Dr. Sallyanne Kuster (cardiology)  Outpatient Medications Prior to Visit  Medication Sig Dispense Refill  . apixaban (ELIQUIS) 5 MG TABS tablet TAKE 1 TABLET (5 MG TOTAL) BY MOUTH 2 (TWO) TIMES DAILY. 60 tablet 5  . b complex vitamins capsule Take 1 capsule by mouth daily.     . calcium carbonate (OS-CAL) 600 MG TABS Take 600 mg by mouth daily.     . cholecalciferol (VITAMIN D) 1000 UNITS tablet Take 1,000 Units by mouth daily.     . fluticasone (FLONASE) 50 MCG/ACT nasal spray Place 2 sprays into both nostrils daily. (Patient taking differently: Place 2 sprays into both nostrils as needed. ) 16 g 0  . furosemide (LASIX) 40 MG tablet Take 1 tablet (40 mg total) by mouth daily. 90 tablet 3  . glucose blood test strip as directed.    [name] guaiFENesin (MUCINEX) 600 MG 12 hr tablet Take 1 tablet (600 mg total) by mouth 2 (two) times daily as needed. (Patient taking differently: Take 600 mg by mouth as needed. ) 14 tablet 0  . levothyroxine (SYNTHROID, LEVOTHROID) 137 MCG tablet Take 1 tablet (137 mcg total) by mouth daily before breakfast. 90 tablet 0  . losartan-hydrochlorothiazide (HYZAAR) 100-25 MG tablet Take 1 tablet by mouth daily. 90 tablet  3  . metFORMIN (GLUCOPHAGE-XR) 500 MG 24 hr tablet Take 2 tablets by mouth every evening. Take with evening meal    . Multiple Vitamin (MULTIVITAMIN) tablet Take 1 tablet by mouth daily.     . Omega-3 Fatty Acids (FISH OIL PO) Take 1 tablet by mouth daily.     . potassium chloride SA (KLOR-CON M20) 20 MEQ tablet Take 1 tablet (20 mEq total) by mouth daily. 90 tablet 3  . PROAIR HFA 108 (90 Base) MCG/ACT inhaler INHALE 2 PUFFS EVERY 4 HOURS AS NEEDED. 8.5 Inhaler 4  . rosuvastatin (CRESTOR) 5 MG tablet TAKE 1 TABLET BY MOUTH EVERY DAY 30 tablet 10  . traMADol (ULTRAM) 50 MG tablet Take 1 tablet (50 mg total) by mouth every 8 (eight) hours as needed. 20 tablet 0  . umeclidinium-vilanterol (ANORO ELLIPTA) 62.5-25 MCG/INH AEPB Inhale 1 puff into the lungs daily. 60 each 11  . BD PEN NEEDLE NANO U/F 32G X 4 MM MISC See admin instructions.  3  . insulin glargine (LANTUS) 100 UNIT/ML injection Inject 40 Units into the skin at bedtime.     . triamcinolone cream (KENALOG) 0.1 % Apply 1 application topically 2 (two) times daily as needed. 30 g 0   No facility-administered medications prior to visit.     ROS See HPI  Objective:  BP 118/78   Pulse 79   Temp 98.1 F (36.7 C)   Ht 5\' 3"  (1.6 m)   Wt 192  lb (87.1 kg)   SpO2 95%   BMI 34.01 kg/m   BP Readings from Last 3 Encounters:  04/21/17 118/78  12/12/16 120/70  10/21/16 118/68    Wt Readings from Last 3 Encounters:  04/21/17 192 lb (87.1 kg)  12/12/16 196 lb 6.4 oz (89.1 kg)  10/21/16 192 lb (87.1 kg)    Physical Exam  Constitutional: She is oriented to person, place, and time. No distress.  HENT:  Mouth/Throat: No oropharyngeal exudate.  Eyes: No scleral icterus.  Cardiovascular: Normal rate and normal heart sounds.   No murmur heard. Irregular heart rhythm  Pulmonary/Chest: Effort normal and breath sounds normal.  Musculoskeletal: She exhibits no edema.  Neurological: She is alert and oriented to person, place, and time.    Skin: Skin is warm and dry. Rash noted.  Vitals reviewed.   Lab Results  Component Value Date   WBC 5.6 12/08/2016   HGB 12.6 12/08/2016   HCT 38.5 12/08/2016   PLT 297 12/08/2016   GLUCOSE 276 (H) 09/22/2016   CHOL 181 12/02/2015   TRIG 91 12/02/2015   HDL 58 12/02/2015   LDLDIRECT 165.6 12/16/2011   LDLCALC 105 12/02/2015   ALT 25 09/22/2016   AST 16 09/22/2016   NA 139 09/22/2016   K 4.6 09/22/2016   CL 100 09/22/2016   CREATININE 0.86 09/22/2016   BUN 28 (H) 09/22/2016   CO2 30 09/22/2016   TSH 0.17 (L) 10/21/2016   INR 1.6 03/11/2015   HGBA1C 7.1 (A) 05/08/2015   MICROALBUR 0.1 10/02/2013    US Breast Ltd Uni Left Inc Axilla  Result Date: 10/28/2016 CLINICAL DATA:  76 year old female with palpable lump in the upper left breast discovered on clinical examination. EXAM: 2D DIGITAL DIAGNOSTIC BILATERAL MAMMOGRAM WITH CAD AND ADJUNCT TOMO ULTRASOUND LEFT BREAST COMPARISON:  06/30/2008 ACR Breast Density Category a: The breast tissue is almost entirely fatty. FINDINGS: 2D and 3D full field views of both breasts and a spot compression view of the left breast demonstrate no suspicious mass, distortion or worrisome calcifications. Mammographic images were processed with CAD. On physical exam, no palpable abnormalities are identified within the upper left breast. Targeted ultrasound is performed, showing no solid or cystic mass, distortion or abnormal shadowing within the upper left breast. IMPRESSION: No mammographic, palpable or sonographic abnormality within the upper left breast. No mammographic evidence of breast malignancy bilaterally. RECOMMENDATION: Bilateral screening mammograms in 1 year as clinically indicated. I have discussed the findings and recommendations with the patient. Results were also provided in writing at the conclusion of the visit. If applicable, a reminder letter will be sent to the patient regarding the next appointment. BI-RADS CATEGORY  1: Negative.  Electronically Signed   By: Margarette Canada M.D.   On: 10/28/2016 14:14   Mm Diag Breast Tomo Bilateral  Result Date: 10/28/2016 CLINICAL DATA:  76 year old female with palpable lump in the upper left breast discovered on clinical examination. EXAM: 2D DIGITAL DIAGNOSTIC BILATERAL MAMMOGRAM WITH CAD AND ADJUNCT TOMO ULTRASOUND LEFT BREAST COMPARISON:  06/30/2008 ACR Breast Density Category a: The breast tissue is almost entirely fatty. FINDINGS: 2D and 3D full field views of both breasts and a spot compression view of the left breast demonstrate no suspicious mass, distortion or worrisome calcifications. Mammographic images were processed with CAD. On physical exam, no palpable abnormalities are identified within the upper left breast. Targeted ultrasound is performed, showing no solid or cystic mass, distortion or abnormal shadowing within the upper left breast. IMPRESSION: No  mammographic, palpable or sonographic abnormality within the upper left breast. No mammographic evidence of breast malignancy bilaterally. RECOMMENDATION: Bilateral screening mammograms in 1 year as clinically indicated. I have discussed the findings and recommendations with the patient. Results were also provided in writing at the conclusion of the visit. If applicable, a reminder letter will be sent to the patient regarding the next appointment. BI-RADS CATEGORY  1: Negative. Electronically Signed   By: Margarette Canada M.D.   On: 10/28/2016 14:14    Assessment & Plan:   Gearldene was seen today for follow-up.  Diagnoses and all orders for this visit:  Type 2 diabetes mellitus with complication, with long-term current use of insulin ([place]) -     Discontinue: Insulin Pen Needle (PEN NEEDLES) 31G X 6 MM MISC; 1 Units by Does not apply route daily. -     Insulin Pen Needle (PEN NEEDLES) 31G X 6 MM MISC; 1 Units by Does not apply route daily.  Hypothyroidism, unspecified type  Benign hypertensive heart disease without heart  failure  Iron deficiency anemia, unspecified iron deficiency anemia type  Mixed hyperlipidemia  Candidiasis, intertrigo -     triamcinolone cream (KENALOG) 0.1 %; Apply 1 application topically 2 (two) times daily as needed. -     miconazole (MICOTIN) 2 % powder; Apply topically as needed for itching.   I have discontinued Ms. Patel's insulin glargine and BD PEN NEEDLE NANO U/F. I am also having her start on miconazole. Additionally, I am having her maintain her multivitamin, b complex vitamins, cholecalciferol, calcium carbonate, glucose blood, Omega-3 Fatty Acids (FISH OIL PO), metFORMIN, losartan-hydrochlorothiazide, traMADol, umeclidinium-vilanterol, furosemide, apixaban, guaiFENesin, fluticasone, PROAIR HFA, potassium chloride SA, levothyroxine, rosuvastatin, LANTUS SOLOSTAR, SHINGRIX, triamcinolone cream, and Pen Needles.  Meds ordered this encounter  Medications  . LANTUS SOLOSTAR 100 UNIT/ML Solostar Pen    Sig: Inject 38 Units into the skin at bedtime.  [name] SHINGRIX injection  . triamcinolone cream (KENALOG) 0.1 %    Sig: Apply 1 application topically 2 (two) times daily as needed.    Dispense:  30 g    Refill:  1    Order Specific Question:   Supervising Provider    Answer:   Cassandria Anger [[id number]]  . miconazole (MICOTIN) 2 % powder    Sig: Apply topically as needed for itching.    Dispense:  70 g    Refill:  0    Order Specific Question:   Supervising Provider    Answer:   Cassandria Anger [[id number]]  . DISCONTD: Insulin Pen Needle (PEN NEEDLES) 31G X 6 MM MISC    Sig: 1 Units by Does not apply route daily.    Dispense:  100 each    Refill:  3    Order Specific Question:   Supervising Provider    Answer:   Cassandria Anger [[id number]]  . Insulin Pen Needle (PEN NEEDLES) 31G X 6 MM MISC    Sig: 1 Units by Does not apply route daily.    Dispense:  100 each    Refill:  3    Follow-up: Return in about 6 months (around 10/21/2017) for DM and HTN,  hyperlipidemia.  Wilfred Lacy, NP

## 2017-05-07 ENCOUNTER — Other Ambulatory Visit: Payer: Self-pay | Admitting: Cardiovascular Disease

## 2017-05-08 NOTE — Telephone Encounter (Signed)
Rx(s) sent to pharmacy electronically.  

## 2017-05-09 ENCOUNTER — Ambulatory Visit (INDEPENDENT_AMBULATORY_CARE_PROVIDER_SITE_OTHER): Payer: Medicare Other | Admitting: Podiatry

## 2017-05-09 ENCOUNTER — Encounter: Payer: Self-pay | Admitting: Podiatry

## 2017-05-09 DIAGNOSIS — M2041 Other hammer toe(s) (acquired), right foot: Secondary | ICD-10-CM

## 2017-05-09 DIAGNOSIS — Q828 Other specified congenital malformations of skin: Secondary | ICD-10-CM

## 2017-05-09 NOTE — Progress Notes (Signed)
She presents today chief complaint of a painful corn fifth toe right foot.  Objective: No change in past mental history medications or allergies. Adductovarus rotated fifth toe right foot resulting in a medial porokeratotic lesion fifth digit right foot.  Assessment: Pain in limb secondary to painful porokeratotic lesion.  Plan: Debrided reactive hyperkeratotic a placed padding. Follow up with me as needed.

## 2017-05-11 ENCOUNTER — Telehealth: Payer: Self-pay | Admitting: Nurse Practitioner

## 2017-05-11 DIAGNOSIS — E039 Hypothyroidism, unspecified: Secondary | ICD-10-CM

## 2017-05-11 MED ORDER — LEVOTHYROXINE SODIUM 137 MCG PO TABS: 137.0000 ug | ORAL_TABLET | Freq: Every day | ORAL | 1 refills | Status: DC

## 2017-05-11 NOTE — Telephone Encounter (Signed)
Pt is needing a refill on her levothyroxine (SYNTHROID, LEVOTHROID) 137 MCG tablet [facility] [address].

## 2017-05-11 NOTE — Telephone Encounter (Signed)
Pt is up-to-date [facility].../lmb

## 2017-06-20 ENCOUNTER — Encounter: Payer: Self-pay | Admitting: Podiatry

## 2017-06-20 ENCOUNTER — Ambulatory Visit (INDEPENDENT_AMBULATORY_CARE_PROVIDER_SITE_OTHER): Payer: Medicare Other | Admitting: Podiatry

## 2017-06-20 DIAGNOSIS — Q828 Other specified congenital malformations of skin: Secondary | ICD-10-CM | POA: Diagnosis not present

## 2017-06-20 DIAGNOSIS — M2041 Other hammer toe(s) (acquired), right foot: Secondary | ICD-10-CM

## 2017-06-20 NOTE — Progress Notes (Signed)
Subjective:  Patient ID: Renee Ray, female    DOB: 10-Apr-1941,  MRN: [id number] HPI Chief Complaint  Patient presents with  . Callouses    Trim corn 5th toe right     76 y.o. female presents with the above complaint. States that she receives routine debridement which alleviates her pain.  Past Medical History:  Diagnosis Date  . Abnormal echocardiogram 2010   showing slightly enlarged left atrium with normal LV function and normal PA pressures.   . Acute medial meniscal injury of knee    RIGHT KNEE  . Asthmatic bronchitis , chronic ([place])   . Atrial fibrillation ([place])   . Bruise    RIGHT ABD. DUE TO LOVENOX INJECTION  02-27-2012  . Chronic anticoagulation    coumadin  . Chronic atrial fibrillation ([place])    CARDIOLOGIST- DR BRACKBILL-- LAST VISIT  12-16-2011 IN [facility]  . Chronic renal insufficiency   . COPD, moderate ([place])    PULMOLOGIST- DR YOUNG --  LAST VISIT NOTE 02-23-2012 IN [facility]  . Diabetes mellitus    INSULIN AND ORAL MEDS  . Goiter 12/15   will have radiation in February  . History of pneumonia    associated w/  rhabdomyolysis  OCT 2012  . Hyperlipidemia   . Hypertension   . Non-ischemic cardiomyopathy ([place])    normal ef  . Normal nuclear stress test 2008  . OA (osteoarthritis) of knee    HANDS   Past Surgical History:  Procedure Laterality Date  . [place]--- BREAST IMPLANTS REMOVED   . KNEE ARTHROSCOPY  03/02/2012   Procedure: ARTHROSCOPY KNEE;  Surgeon: Gearlean Alf, MD;  Location[facility];  Service: Orthopedics;  Laterality: Right;  WITH DEBRIDEMENT of medial menisces  . VAGINAL HYSTERECTOMY  1978    Current Outpatient Prescriptions:  .  apixaban (ELIQUIS) 5 MG TABS tablet, TAKE 1 TABLET (5 MG TOTAL) BY MOUTH 2 (TWO) TIMES DAILY., Disp: 60 tablet, Rfl: 5 .  b complex vitamins capsule, Take 1 capsule by mouth daily. , Disp: , Rfl:  .  calcium carbonate (OS-CAL) 600 MG TABS, Take 600 mg by mouth  daily. , Disp: , Rfl:  .  cholecalciferol (VITAMIN D) 1000 UNITS tablet, Take 1,000 Units by mouth daily. , Disp: , Rfl:  .  fluticasone (FLONASE) 50 MCG/ACT nasal spray, Place 2 sprays into both nostrils daily. (Patient taking differently: Place 2 sprays into both nostrils as needed. ), Disp: 16 g, Rfl: 0 .  furosemide (LASIX) 40 MG tablet, Take 1 tablet (40 mg total) by mouth daily., Disp: 90 tablet, Rfl: 3 .  glucose blood test strip, as directed., Disp: , Rfl:  .  guaiFENesin (MUCINEX) 600 MG 12 hr tablet, Take 1 tablet (600 mg total) by mouth 2 (two) times daily as needed. (Patient taking differently: Take 600 mg by mouth as needed. ), Disp: 14 tablet, Rfl: 0 .  Insulin Pen Needle (PEN NEEDLES) 31G X 6 MM MISC, 1 Units by Does not apply route daily., Disp: 100 each, Rfl: 3 .  LANTUS SOLOSTAR 100 UNIT/ML Solostar Pen, Inject 38 Units into the skin at bedtime., Disp: , Rfl:  .  levothyroxine (SYNTHROID, LEVOTHROID) 137 MCG tablet, Take 1 tablet (137 mcg total) by mouth daily before breakfast. Annual appt w/labs due in Dec will have to see provider for future refills, Disp: 90 tablet, Rfl: 1 .  losartan-hydrochlorothiazide (HYZAAR) 100-25 MG tablet, TAKE 1 TABLET BY MOUTH DAILY.,  Disp: 90 tablet, Rfl: 3 .  metFORMIN (GLUCOPHAGE-XR) 500 MG 24 hr tablet, Take 2 tablets by mouth every evening. Take with evening meal, Disp: , Rfl:  .  miconazole (MICOTIN) 2 % powder, Apply topically as needed for itching., Disp: 70 g, Rfl: 0 .  Multiple Vitamin (MULTIVITAMIN) tablet, Take 1 tablet by mouth daily. , Disp: , Rfl:  .  Omega-3 Fatty Acids (FISH OIL PO), Take 1 tablet by mouth daily. , Disp: , Rfl:  .  potassium chloride SA (KLOR-CON M20) 20 MEQ tablet, Take 1 tablet (20 mEq total) by mouth daily., Disp: 90 tablet, Rfl: 3 .  PROAIR HFA 108 (90 Base) MCG/ACT inhaler, INHALE 2 PUFFS EVERY 4 HOURS AS NEEDED., Disp: 8.5 Inhaler, Rfl: 4 .  rosuvastatin (CRESTOR) 5 MG tablet, TAKE 1 TABLET BY MOUTH EVERY DAY,  Disp: 30 tablet, Rfl: 10 .  SHINGRIX injection, , Disp: , Rfl:  .  traMADol (ULTRAM) 50 MG tablet, Take 1 tablet (50 mg total) by mouth every 8 (eight) hours as needed., Disp: 20 tablet, Rfl: 0 .  triamcinolone cream (KENALOG) 0.1 %, Apply 1 application topically 2 (two) times daily as needed., Disp: 30 g, Rfl: 1 .  umeclidinium-vilanterol (ANORO ELLIPTA) 62.5-25 MCG/INH AEPB, Inhale 1 puff into the lungs daily., Disp: 60 each, Rfl: 11  Allergies  Allergen Reactions  . Advil [Ibuprofen] Shortness Of Breath    Asthma  . Janumet [Sitagliptin-Metformin Hcl] Swelling  . Amoxicillin Diarrhea  . Sulfonamide Derivatives Other (See Comments)    REACTION: thrush   Review of Systems Objective:  There were no vitals filed for this visit. General AA&O x3. Normal mood and affect.  Vascular Dorsalis pedis and posterior tibial pulses 2/4 bilat. Brisk capillary refill to all digits. Pedal hair present.  Neurologic Epicritic sensation grossly intact.  Dermatologic No open lesions. Interspaces clear of maceration. Nails well groomed and normal in appearance. Hyperkeratotic lesion R 5th toe DIPJ.  Orthopedic: MMT 5/5 in dorsiflexion, plantarflexion, inversion, and eversion. Normal joint ROM without pain or crepitus.   Radiographs: Taken and reviewed. No acute fractures or dislocations. No other osseous abnormalities. Assessment & Plan:  Patient was evaluated and treated and all questions answered.  Interdigital Corn R 5th Toe -Palliative debridement of lesion. -Corn pad dispensed.  Return in about 6 weeks (around 08/01/2017) for Routine Foot Care.

## 2017-07-27 ENCOUNTER — Ambulatory Visit: Payer: Medicare Other | Admitting: Podiatry

## 2017-08-10 ENCOUNTER — Encounter: Payer: Self-pay | Admitting: Podiatry

## 2017-08-10 ENCOUNTER — Ambulatory Visit (INDEPENDENT_AMBULATORY_CARE_PROVIDER_SITE_OTHER): Payer: Medicare Other | Admitting: Podiatry

## 2017-08-10 DIAGNOSIS — Q828 Other specified congenital malformations of skin: Secondary | ICD-10-CM | POA: Diagnosis not present

## 2017-08-11 NOTE — Progress Notes (Signed)
She presents today chief complaint of porokeratotic lesion medial aspect fifth digit of the right foot. No other pain.  Objective: Adductor varus rotated hammertoe deformity fifth right pulses are palpable. Medial distal reactive hyperkeratotic lesion.  Plan: Debridement of lesion today. Follow-up with me in 6 weeks

## 2017-08-29 LAB — HM DIABETES EYE EXAM

## 2017-08-30 ENCOUNTER — Encounter: Payer: Self-pay | Admitting: Nurse Practitioner

## 2017-09-25 ENCOUNTER — Other Ambulatory Visit: Payer: Self-pay | Admitting: Cardiovascular Disease

## 2017-10-10 ENCOUNTER — Ambulatory Visit: Payer: Medicare Other | Admitting: Podiatry

## 2017-10-10 ENCOUNTER — Encounter: Payer: Self-pay | Admitting: Podiatry

## 2017-10-10 DIAGNOSIS — Q828 Other specified congenital malformations of skin: Secondary | ICD-10-CM

## 2017-10-10 DIAGNOSIS — M2041 Other hammer toe(s) (acquired), right foot: Secondary | ICD-10-CM

## 2017-10-10 NOTE — Progress Notes (Signed)
She presents today for chief complaint of a porokeratosis fifth digit of the right foot.  Objective: Vital signs are stable alert and oriented x3.  Pulses are palpable.  Neurologic sensorium is intact.  Deep tendon reflexes are intact.  Porokeratotic lesion medial aspect fifth digit of the right foot associated with an adductor varus rotated hammertoe deformity does not demonstrate any signs of infection.  Assessment: Porokeratosis fifth digit right foot.  Plan: Debridement of benign hyperkeratotic lesion fifth digit right foot placed moleskin for padding.

## 2017-10-24 ENCOUNTER — Ambulatory Visit (INDEPENDENT_AMBULATORY_CARE_PROVIDER_SITE_OTHER): Payer: Medicare Other | Admitting: Nurse Practitioner

## 2017-10-24 ENCOUNTER — Encounter: Payer: Self-pay | Admitting: Nurse Practitioner

## 2017-10-24 VITALS — BP 120/74 | HR 87 | Temp 97.9°F | Ht 63.0 in | Wt 196.0 lb

## 2017-10-24 DIAGNOSIS — I119 Hypertensive heart disease without heart failure: Secondary | ICD-10-CM

## 2017-10-24 DIAGNOSIS — E559 Vitamin D deficiency, unspecified: Secondary | ICD-10-CM | POA: Diagnosis not present

## 2017-10-24 DIAGNOSIS — E1169 Type 2 diabetes mellitus with other specified complication: Secondary | ICD-10-CM

## 2017-10-24 DIAGNOSIS — E039 Hypothyroidism, unspecified: Secondary | ICD-10-CM

## 2017-10-24 DIAGNOSIS — E782 Mixed hyperlipidemia: Secondary | ICD-10-CM | POA: Diagnosis not present

## 2017-10-24 DIAGNOSIS — D509 Iron deficiency anemia, unspecified: Secondary | ICD-10-CM

## 2017-10-24 DIAGNOSIS — B372 Candidiasis of skin and nail: Secondary | ICD-10-CM | POA: Diagnosis not present

## 2017-10-24 DIAGNOSIS — E669 Obesity, unspecified: Secondary | ICD-10-CM | POA: Diagnosis not present

## 2017-10-24 LAB — CBC
HCT: 39.8 % (ref 36.0–46.0)
HEMOGLOBIN: 12.7 g/dL (ref 12.0–15.0)
MCHC: 31.9 g/dL (ref 30.0–36.0)
MCV: 90.3 fl (ref 78.0–100.0)
Platelets: 305 10*3/uL (ref 150.0–400.0)
RBC: 4.41 Mil/uL (ref 3.87–5.11)
RDW: 14.2 % (ref 11.5–15.5)
WBC: 6.8 10*3/uL (ref 4.0–10.5)

## 2017-10-24 LAB — BASIC METABOLIC PANEL
BUN: 21 mg/dL (ref 6–23)
CHLORIDE: 104 meq/L (ref 96–112)
CO2: 30 meq/L (ref 19–32)
Calcium: 9.3 mg/dL (ref 8.4–10.5)
Creatinine, Ser: 0.75 mg/dL (ref 0.40–1.20)
GFR: 79.59 mL/min (ref >60.00)
GLUCOSE: 84 mg/dL (ref 70–99)
POTASSIUM: 4.3 meq/L (ref 3.5–5.1)
SODIUM: 142 meq/L (ref 135–145)

## 2017-10-24 LAB — HEPATIC FUNCTION PANEL
ALBUMIN: 3.9 g/dL (ref 3.5–5.2)
ALT: 24 U/L (ref 0–35)
AST: 19 U/L (ref 0–37)
Alkaline Phosphatase: 74 U/L (ref 39–117)
Bilirubin, Direct: 0.1 mg/dL (ref 0.0–0.3)
TOTAL PROTEIN: 6.5 g/dL (ref 6.0–8.3)
Total Bilirubin: 0.4 mg/dL (ref 0.2–1.2)

## 2017-10-24 LAB — LIPID PANEL
CHOL/HDL RATIO: 3
Cholesterol: 176 mg/dL (ref 0–200)
HDL: 68.7 mg/dL (ref >39.00)
LDL Cholesterol: 89 mg/dL (ref 0–99)
NonHDL: 107.36
Triglycerides: 94 mg/dL (ref 0.0–149.0)
VLDL: 18.8 mg/dL (ref 0.0–40.0)

## 2017-10-24 LAB — VITAMIN D 25 HYDROXY (VIT D DEFICIENCY, FRACTURES): VITD: 49.79 ng/mL (ref 30.00–100.00)

## 2017-10-24 LAB — IBC PANEL
IRON: 46 ug/dL (ref 42–145)
SATURATION RATIOS: 11.8 % — AB (ref 20.0–50.0)
Transferrin: 278 mg/dL (ref 212.0–360.0)

## 2017-10-24 MED ORDER — TRIAMCINOLONE ACETONIDE 0.1 % EX CREA: 1.0000 "application " | TOPICAL_CREAM | Freq: Two times a day (BID) | CUTANEOUS | 1 refills | Status: DC | PRN

## 2017-10-24 MED ORDER — LEVOTHYROXINE SODIUM 137 MCG PO TABS: 137.0000 ug | ORAL_TABLET | Freq: Every day | ORAL | 1 refills | Status: DC

## 2017-10-24 NOTE — Patient Instructions (Addendum)
Go to lab for blood draw.  You will be called with results.  Need to schedule AWV with Ronnie.  Merry Christmas and happy New Year.

## 2017-10-24 NOTE — Progress Notes (Signed)
Subjective:  Patient ID: Renee Ray, female    DOB: 01-21-1941  Age: 76 y.o. MRN: [id number]  CC: Follow-up (6 mo fu/med refills: tyroid med and cream)   HPI  Denies any acute complaint.  DM: Controlled with last hgbA1c 7.3 done 09/2017. Current use of novolin and metformin. Denies any hypoglycemic episodes. appt with Dr. Buddy Duty in 48months Last seen 09/2017.  HTN and A-fib: Stable BP with hyzaar, furosemide and potassium Rate stable Next appt 11/2017.  Intetrigo: Chronic, intermittent. Needs triamcinolone refill. Controlled with miconazole and triamcinolone.  Outpatient Medications Prior to Visit  Medication Sig Dispense Refill  . apixaban (ELIQUIS) 5 MG TABS tablet TAKE 1 TABLET (5 MG TOTAL) BY MOUTH 2 (TWO) TIMES DAILY. 60 tablet 5  . b complex vitamins capsule Take 1 capsule by mouth daily.     . calcium carbonate (OS-CAL) 600 MG TABS Take 600 mg by mouth daily.     . cholecalciferol (VITAMIN D) 1000 UNITS tablet Take 1,000 Units by mouth daily.     . fluticasone (FLONASE) 50 MCG/ACT nasal spray Place 2 sprays into both nostrils daily. 16 g 0  . furosemide (LASIX) 40 MG tablet TAKE 1 TABLET (40 MG TOTAL) BY MOUTH DAILY. 90 tablet 3  . glucose blood test strip as directed.    [name] guaiFENesin (MUCINEX) 600 MG 12 hr tablet Take 1 tablet (600 mg total) by mouth 2 (two) times daily as needed. (Patient taking differently: Take 600 mg by mouth as needed. ) 14 tablet 0  . Insulin Pen Needle (PEN NEEDLES) 31G X 6 MM MISC 1 Units by Does not apply route daily. 100 each 3  . losartan-hydrochlorothiazide (HYZAAR) 100-25 MG tablet TAKE 1 TABLET BY MOUTH DAILY. 90 tablet 3  . metFORMIN (GLUCOPHAGE-XR) 500 MG 24 hr tablet Take 4 tablets by mouth every evening. Take with evening meal    . miconazole (MICOTIN) 2 % powder Apply topically as needed for itching. 70 g 0  . Multiple Vitamin (MULTIVITAMIN) tablet Take 1 tablet by mouth daily.     . Omega-3 Fatty Acids (FISH OIL PO) Take 1  tablet by mouth daily.     . potassium chloride SA (KLOR-CON M20) 20 MEQ tablet Take 1 tablet (20 mEq total) by mouth daily. 90 tablet 3  . PROAIR HFA 108 (90 Base) MCG/ACT inhaler INHALE 2 PUFFS EVERY 4 HOURS AS NEEDED. 8.5 Inhaler 4  . rosuvastatin (CRESTOR) 5 MG tablet TAKE 1 TABLET BY MOUTH EVERY DAY 30 tablet 10  . SHINGRIX injection     . traMADol (ULTRAM) 50 MG tablet Take 1 tablet (50 mg total) by mouth every 8 (eight) hours as needed. 20 tablet 0  . levothyroxine (SYNTHROID, LEVOTHROID) 137 MCG tablet Take 1 tablet (137 mcg total) by mouth daily before breakfast. Annual appt w/labs due in Dec will have to see provider for future refills 90 tablet 1  . triamcinolone cream (KENALOG) 0.1 % Apply 1 application topically 2 (two) times daily as needed. 30 g 1  . Insulin Isophane & Regular Human (NOVOLIN 70/30 FLEXPEN) (70-30) 100 UNIT/ML PEN Inject 28 Units into the skin every morning AND 24 Units daily after supper. 15 mL 11  . LANTUS SOLOSTAR 100 UNIT/ML Solostar Pen Inject 38 Units into the skin at bedtime.    [name] umeclidinium-vilanterol (ANORO ELLIPTA) 62.5-25 MCG/INH AEPB Inhale 1 puff into the lungs daily. (Patient not taking: Reported on 10/24/2017) 60 each 11   No facility-administered medications prior to visit.  ROS See HPI  Objective:  BP 120/74   Pulse 87   Temp 97.9 F (36.6 C)   Ht 5\' 3"  (1.6 m)   Wt 196 lb (88.9 kg)   SpO2 98%   BMI 34.72 kg/m   BP Readings from Last 3 Encounters:  10/24/17 120/74  04/21/17 118/78  12/12/16 120/70    Wt Readings from Last 3 Encounters:  10/24/17 196 lb (88.9 kg)  04/21/17 192 lb (87.1 kg)  12/12/16 196 lb 6.4 oz (89.1 kg)    Physical Exam  Constitutional: Renee Ray is oriented to person, place, and time. No distress.  Neck: No JVD present.  Cardiovascular: Normal rate. An irregular rhythm present.  Pulmonary/Chest: Effort normal and breath sounds normal.  Musculoskeletal: Renee Ray exhibits no edema.  Neurological: Renee Ray is alert  and oriented to person, place, and time.  Skin: Skin is warm and dry.  Psychiatric: Renee Ray has a normal mood and affect. Her behavior is normal.  Vitals reviewed.   Lab Results  Component Value Date   WBC 6.8 10/24/2017   HGB 12.7 10/24/2017   HCT 39.8 10/24/2017   PLT 305.0 10/24/2017   GLUCOSE 84 10/24/2017   CHOL 176 10/24/2017   TRIG 94.0 10/24/2017   HDL 68.70 10/24/2017   LDLDIRECT 165.6 12/16/2011   LDLCALC 89 10/24/2017   ALT 24 10/24/2017   AST 19 10/24/2017   NA 142 10/24/2017   K 4.3 10/24/2017   CL 104 10/24/2017   CREATININE 0.75 10/24/2017   BUN 21 10/24/2017   CO2 30 10/24/2017   TSH 0.17 (L) 10/21/2016   INR 1.6 03/11/2015   HGBA1C 7.1 (A) 05/08/2015   MICROALBUR 0.1 10/02/2013    US Breast Ltd Uni Left Inc Axilla  Result Date: 10/28/2016 CLINICAL DATA:  76 year old female with palpable lump in the upper left breast discovered on clinical examination. EXAM: 2D DIGITAL DIAGNOSTIC BILATERAL MAMMOGRAM WITH CAD AND ADJUNCT TOMO ULTRASOUND LEFT BREAST COMPARISON:  06/30/2008 ACR Breast Density Category a: The breast tissue is almost entirely fatty. FINDINGS: 2D and 3D full field views of both breasts and a spot compression view of the left breast demonstrate no suspicious mass, distortion or worrisome calcifications. Mammographic images were processed with CAD. On physical exam, no palpable abnormalities are identified within the upper left breast. Targeted ultrasound is performed, showing no solid or cystic mass, distortion or abnormal shadowing within the upper left breast. IMPRESSION: No mammographic, palpable or sonographic abnormality within the upper left breast. No mammographic evidence of breast malignancy bilaterally. RECOMMENDATION: Bilateral screening mammograms in 1 year as clinically indicated. I have discussed the findings and recommendations with the patient. Results were also provided in writing at the conclusion of the visit. If applicable, a reminder  letter will be sent to the patient regarding the next appointment. BI-RADS CATEGORY  1: Negative. Electronically Signed   By: Margarette Canada M.D.   On: 10/28/2016 14:14   Mm Diag Breast Tomo Bilateral  Result Date: 10/28/2016 CLINICAL DATA:  76 year old female with palpable lump in the upper left breast discovered on clinical examination. EXAM: 2D DIGITAL DIAGNOSTIC BILATERAL MAMMOGRAM WITH CAD AND ADJUNCT TOMO ULTRASOUND LEFT BREAST COMPARISON:  06/30/2008 ACR Breast Density Category a: The breast tissue is almost entirely fatty. FINDINGS: 2D and 3D full field views of both breasts and a spot compression view of the left breast demonstrate no suspicious mass, distortion or worrisome calcifications. Mammographic images were processed with CAD. On physical exam, no palpable abnormalities are identified within the upper  left breast. Targeted ultrasound is performed, showing no solid or cystic mass, distortion or abnormal shadowing within the upper left breast. IMPRESSION: No mammographic, palpable or sonographic abnormality within the upper left breast. No mammographic evidence of breast malignancy bilaterally. RECOMMENDATION: Bilateral screening mammograms in 1 year as clinically indicated. I have discussed the findings and recommendations with the patient. Results were also provided in writing at the conclusion of the visit. If applicable, a reminder letter will be sent to the patient regarding the next appointment. BI-RADS CATEGORY  1: Negative. Electronically Signed   By: Margarette Canada M.D.   On: 10/28/2016 14:14    Assessment & Plan:   Renee Ray was seen today for follow-up.  Diagnoses and all orders for this visit:  Iron deficiency anemia, unspecified iron deficiency anemia type -     CBC -     IBC panel  Mixed hyperlipidemia -     Lipid panel -     Hepatic function panel  Vitamin D deficiency -     VITAMIN D 25 Hydroxy (Vit-D Deficiency, Fractures)  Benign hypertensive heart disease without  heart failure -     Basic metabolic panel  Diabetes mellitus type 2 in obese (HCC) -     Basic metabolic panel  Candidiasis, intertrigo -     triamcinolone cream (KENALOG) 0.1 %; Apply 1 application topically 2 (two) times daily as needed.  Hypothyroidism, unspecified type -     levothyroxine (SYNTHROID, LEVOTHROID) 137 MCG tablet; Take 1 tablet (137 mcg total) by mouth daily before breakfast.   I have discontinued Elizabeth Sauer. Roszak's umeclidinium-vilanterol and LANTUS SOLOSTAR. I have also changed her levothyroxine. Additionally, I am having her maintain her multivitamin, b complex vitamins, cholecalciferol, calcium carbonate, glucose blood, Omega-3 Fatty Acids (FISH OIL PO), metFORMIN, traMADol, apixaban, guaiFENesin, fluticasone, PROAIR HFA, potassium chloride SA, rosuvastatin, SHINGRIX, miconazole, Pen Needles, losartan-hydrochlorothiazide, furosemide, Insulin Isophane & Regular Human, and triamcinolone cream.  Meds ordered this encounter  Medications  . triamcinolone cream (KENALOG) 0.1 %    Sig: Apply 1 application topically 2 (two) times daily as needed.    Dispense:  30 g    Refill:  1    Order Specific Question:   Supervising Provider    Answer:   Lucille Passy [[id number]]  . levothyroxine (SYNTHROID, LEVOTHROID) 137 MCG tablet    Sig: Take 1 tablet (137 mcg total) by mouth daily before breakfast.    Dispense:  90 tablet    Refill:  1    Dispense as insurance will pay for.    Order Specific Question:   Supervising Provider    Answer:   Lucille Passy [[id number]]    Follow-up: Return in about 6 months (around 04/24/2018) for DM and HTN, hyperlipidemia.Wilfred Lacy, NP

## 2017-10-25 ENCOUNTER — Other Ambulatory Visit: Payer: Self-pay | Admitting: Cardiovascular Disease

## 2017-11-21 ENCOUNTER — Ambulatory Visit: Payer: Medicare Other | Admitting: Podiatry

## 2017-11-21 ENCOUNTER — Encounter: Payer: Self-pay | Admitting: Podiatry

## 2017-11-21 DIAGNOSIS — Q828 Other specified congenital malformations of skin: Secondary | ICD-10-CM

## 2017-11-21 DIAGNOSIS — M2041 Other hammer toe(s) (acquired), right foot: Secondary | ICD-10-CM

## 2017-11-21 NOTE — Progress Notes (Signed)
She presents today chief complaint of poor keratoma to the fifth digit of the right foot.  Objective: Vital signs are stable she is alert and oriented x3 no erythema edema cellulitis drainage or odor.  Adductovarus rotated hammertoe deformity fifth right has resulted in reactive hyperkeratosis medial aspect of the DIPJ.  No open lesions or wounds are noted.  Assessment: Callus deformity and formation hammertoe fifth right.  Plan: Debridement of reactive hyperkeratosis.

## 2018-01-02 ENCOUNTER — Ambulatory Visit: Payer: Medicare Other | Admitting: Podiatry

## 2018-01-02 ENCOUNTER — Other Ambulatory Visit: Payer: Self-pay | Admitting: Internal Medicine

## 2018-01-02 DIAGNOSIS — M2041 Other hammer toe(s) (acquired), right foot: Secondary | ICD-10-CM

## 2018-01-02 DIAGNOSIS — Q828 Other specified congenital malformations of skin: Secondary | ICD-10-CM | POA: Diagnosis not present

## 2018-01-02 NOTE — Progress Notes (Signed)
She presents today for follow-up of a corn to the medial aspect fifth digit of the right foot.  States is been killing me lately.  Objective: Vital signs are stable alert and oriented x3.  There is no erythema edema cellulitis drainage or odor.  Reactive hyperkeratotic medial aspect of the adductor varus rotated hammertoe deformity fifth digit right foot resulting in pain.  Assessment: Keratosis medial aspect fifth toe right.  Plan: Debridement of reactive hyperkeratotic lesion.  Place padding we will follow-up with her in a few weeks.

## 2018-01-09 ENCOUNTER — Other Ambulatory Visit: Payer: Self-pay | Admitting: Internal Medicine

## 2018-01-09 NOTE — Telephone Encounter (Signed)
Pt last seen 2017 and needs OV for refills. I have sent denial to pharmacy.

## 2018-01-18 ENCOUNTER — Other Ambulatory Visit: Payer: Self-pay | Admitting: Cardiovascular Disease

## 2018-01-23 ENCOUNTER — Telehealth: Payer: Self-pay | Admitting: Cardiovascular Disease

## 2018-01-23 NOTE — Telephone Encounter (Signed)
Returned call to patient.She stated she spoke to her pharmacist at [facility] her lot # for losartan not recalled.She was told ok to take.She just wanted to make sure that was correct.Advised if lot # not recalled ok to take losartan.She wanted Dr.Croitoru to know.Message sent to him.

## 2018-01-23 NOTE — Telephone Encounter (Signed)
Pt c/o medication issue:  1. Name of Medication: Losartan HCTZ  2. How are you currently taking this medication (dosage and times per day)?  3. Are you having a reaction (difficulty breathing--STAT)?   4. What is your medication issue?

## 2018-01-23 NOTE — Telephone Encounter (Signed)
Agree with instructions MCr

## 2018-02-05 IMAGING — DX DG TIBIA/FIBULA 2V*L*
2 series · 2 of 2 positions shown · non-contrast
Comparison: None.

CLINICAL DATA: Left leg pain.  Rule out fracture.

EXAM:
LEFT TIBIA AND FIBULA - 2 VIEW

[tibia ap]
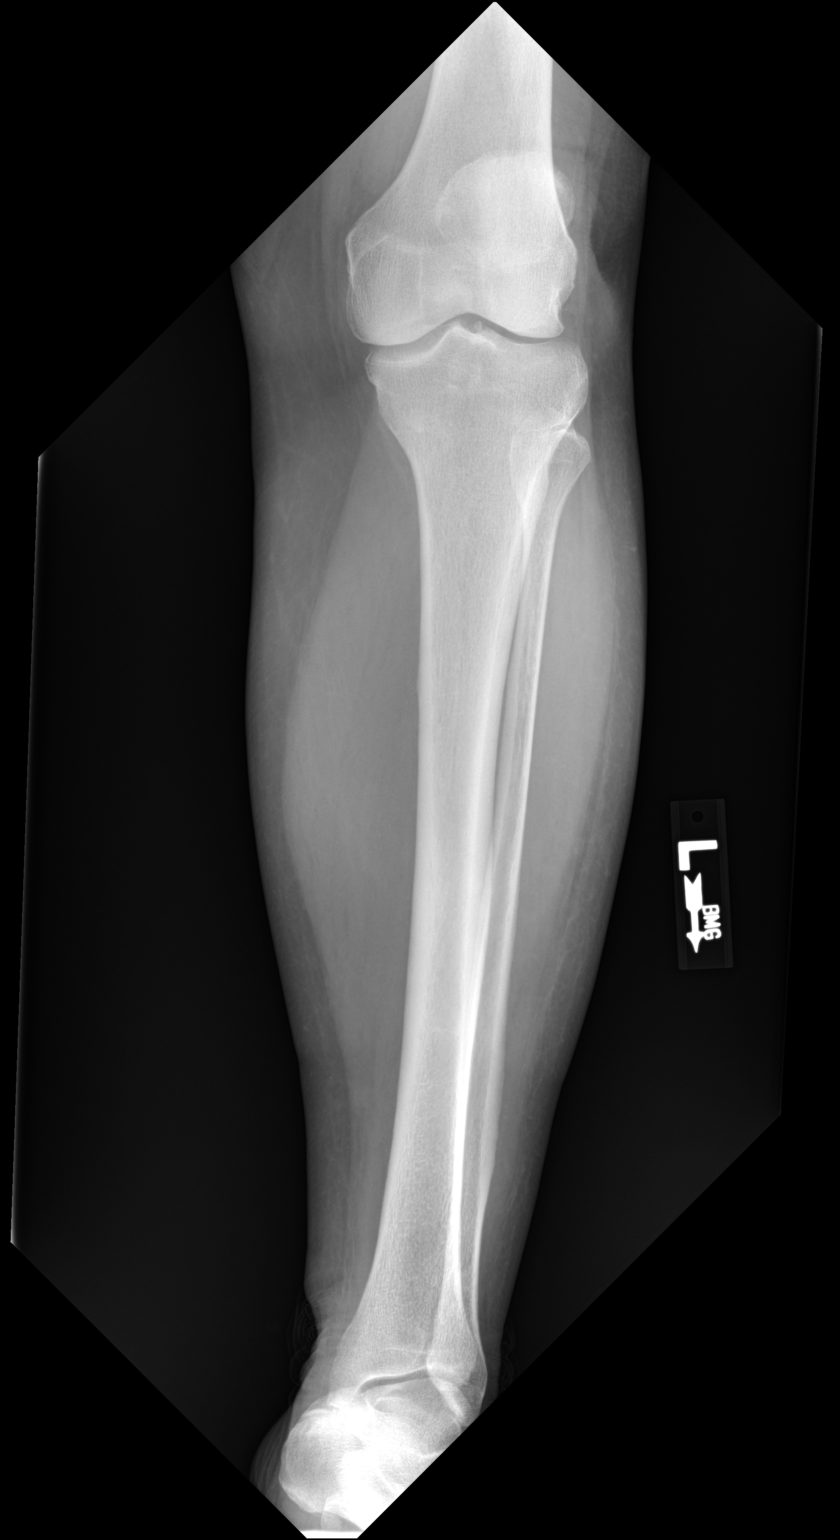

[tibia lat]
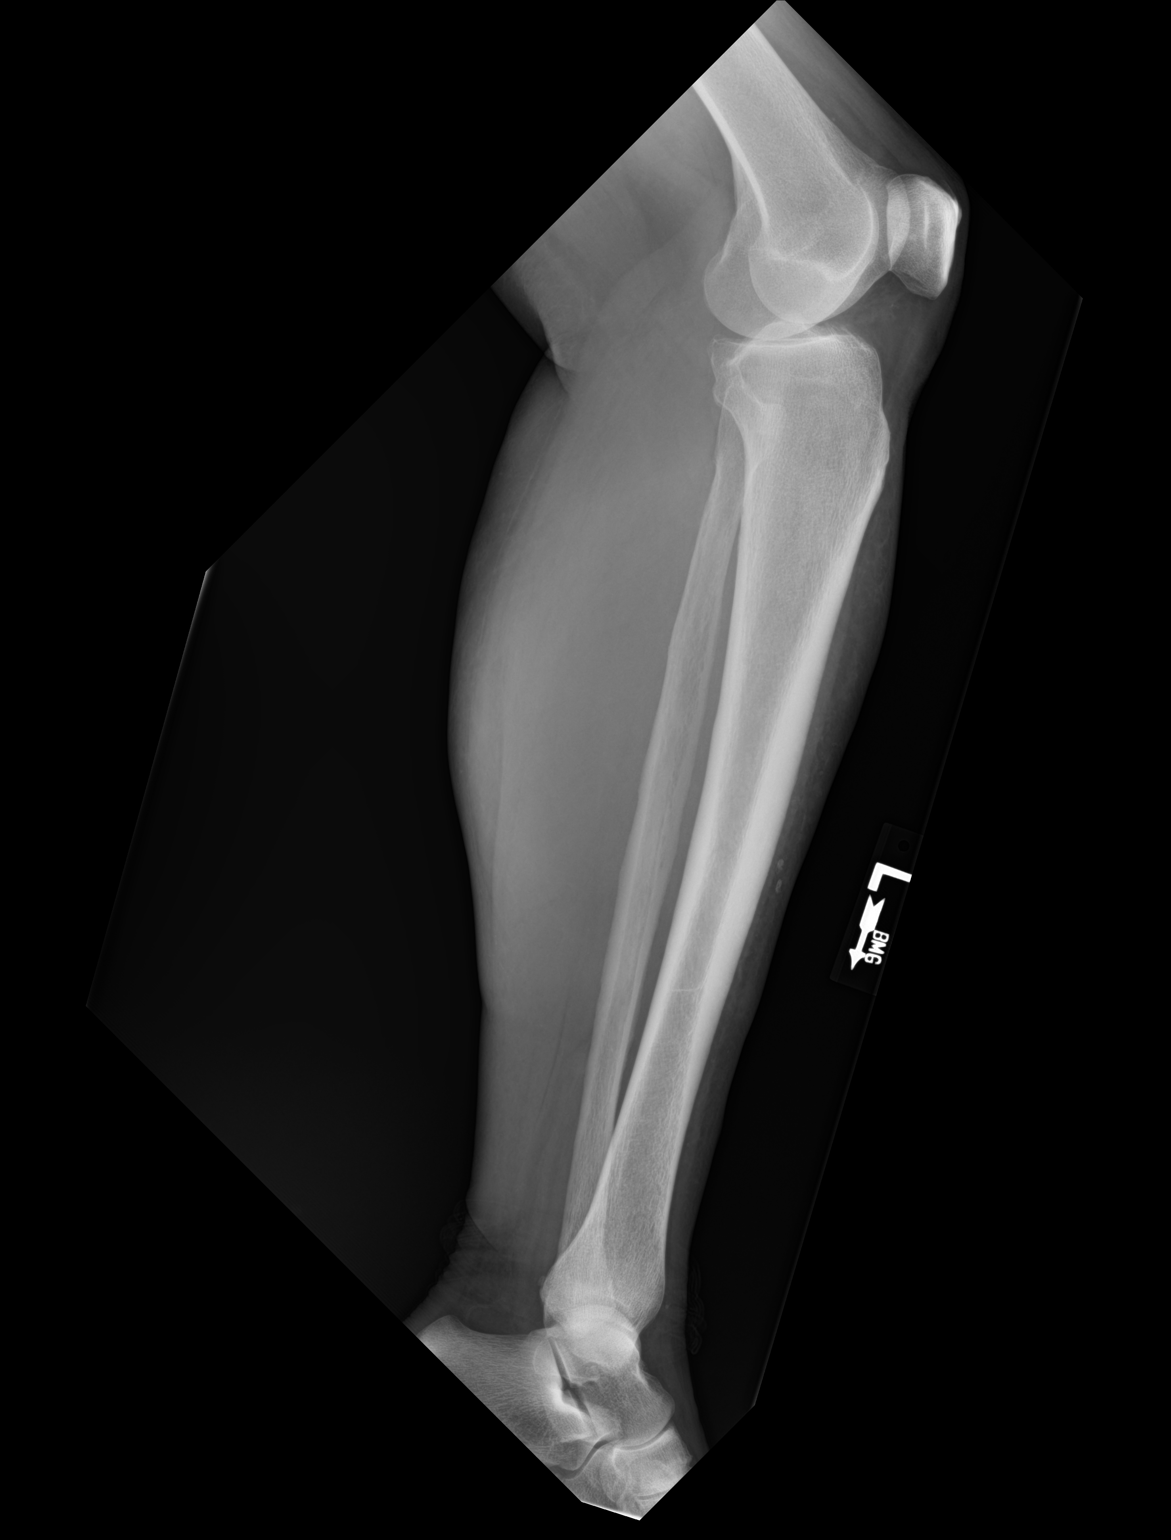

[2 of 2 positions shown; findings below may reference images not displayed]

FINDINGS: No fracture involving the left tibia or fibula. Few soft tissue
calcifications. No gross abnormality to the knee or ankle.
IMPRESSION: No acute abnormality.

## 2018-02-13 ENCOUNTER — Encounter: Payer: Self-pay | Admitting: Podiatry

## 2018-02-13 ENCOUNTER — Ambulatory Visit: Payer: Medicare Other | Admitting: Podiatry

## 2018-02-13 DIAGNOSIS — Q828 Other specified congenital malformations of skin: Secondary | ICD-10-CM

## 2018-02-13 DIAGNOSIS — D689 Coagulation defect, unspecified: Secondary | ICD-10-CM | POA: Diagnosis not present

## 2018-02-13 DIAGNOSIS — M2041 Other hammer toe(s) (acquired), right foot: Secondary | ICD-10-CM

## 2018-02-13 NOTE — Progress Notes (Signed)
She presents today for follow-up of her painful corn medial aspect fifth digit right foot.  Objective: Vital signs are stable she is alert and oriented x3 pulses are palpable.  Reactive hyperkeratosis on medial aspect of the fifth digit right foot.  Reactive hyperkeratosis is larger than it normally is at this time.  She states that she has been walking more.  There is no skin breakdown no purulence no malodor.  Digital deformity is to blame.  Assessment: Adductovarus rotated hammertoe deformity fifth right resulting in reactive hyperkeratosis medial aspect fifth digit right foot.  Plan: Debridement of reactive hyperkeratosis placed padding.

## 2018-02-23 ENCOUNTER — Other Ambulatory Visit: Payer: Self-pay | Admitting: Cardiovascular Disease

## 2018-03-27 ENCOUNTER — Ambulatory Visit: Payer: Medicare Other | Admitting: Podiatry

## 2018-03-27 ENCOUNTER — Encounter: Payer: Self-pay | Admitting: Podiatry

## 2018-03-27 DIAGNOSIS — D689 Coagulation defect, unspecified: Secondary | ICD-10-CM | POA: Diagnosis not present

## 2018-03-27 DIAGNOSIS — Q828 Other specified congenital malformations of skin: Secondary | ICD-10-CM | POA: Diagnosis not present

## 2018-03-27 DIAGNOSIS — M2041 Other hammer toe(s) (acquired), right foot: Secondary | ICD-10-CM | POA: Diagnosis not present

## 2018-03-27 NOTE — Progress Notes (Signed)
She presents today for chief complaint of painful lesion between the fourth and fifth digits of the right foot.  I am here to get this trimmed again.  Objective: Vital signs are stable alert and oriented x3.  Pulses are palpable.  Reactive hyper keratomas to the medial aspect fifth digit right foot lateral aspect of the PIPJ fourth digit right foot.  No open lesions or wounds.  Debrided all reactive hyperkeratosis for her today.  Assessment: Painful porokeratotic lesion fifth toe right foot.  Plan: Debrided all reactive hyperkeratosis placed padding follow-up with her on an as-needed basis discussed the possible need for surgery.

## 2018-03-29 LAB — LIPID PANEL
CHOLESTEROL: 175 (ref 0–200)
HDL: 60 (ref 35–70)
LDL Cholesterol: 75
TRIGLYCERIDES: 205 — AB (ref 40–160)

## 2018-03-29 LAB — HEPATIC FUNCTION PANEL
ALT: 21 (ref 7–35)
AST: 19 (ref 13–35)
Bilirubin, Total: 0.3

## 2018-03-29 LAB — BASIC METABOLIC PANEL
BUN: 24 — AB (ref 4–21)
Creatinine: 0.8 (ref 0.5–1.1)
Glucose: 132
Potassium: 4.5 (ref 3.4–5.3)
Sodium: 145 (ref 137–147)

## 2018-03-30 ENCOUNTER — Encounter: Payer: Self-pay | Admitting: Nurse Practitioner

## 2018-03-30 ENCOUNTER — Ambulatory Visit (INDEPENDENT_AMBULATORY_CARE_PROVIDER_SITE_OTHER): Payer: Medicare Other | Admitting: Nurse Practitioner

## 2018-03-30 VITALS — BP 134/80 | HR 94 | Temp 98.1°F | Ht 63.0 in | Wt 196.6 lb

## 2018-03-30 DIAGNOSIS — B372 Candidiasis of skin and nail: Secondary | ICD-10-CM

## 2018-03-30 DIAGNOSIS — M160 Bilateral primary osteoarthritis of hip: Secondary | ICD-10-CM

## 2018-03-30 MED ORDER — TRAMADOL HCL 50 MG PO TABS: 50.0000 mg | ORAL_TABLET | Freq: Three times a day (TID) | ORAL | 0 refills | Status: AC | PRN

## 2018-03-30 MED ORDER — TRIAMCINOLONE ACETONIDE 0.1 % EX CREA: 1.0000 "application " | TOPICAL_CREAM | Freq: Two times a day (BID) | CUTANEOUS | 1 refills | Status: DC | PRN

## 2018-03-30 NOTE — Progress Notes (Signed)
Subjective:  Patient ID: Renee Ray, female    DOB: 1941-09-08  Age: 77 y.o. MRN: [id number]  CC: Follow-up (DM and HTN, hyperlipidemia/ saw endo doc 1 wk:A1C 7.3 , had comp and lipid--waiting for result. kenalog cream refills. ) and Back Pain (back pain,sciatica--saw Orth back 2017--med consult?)  HPI  Intetrigo:  Chronic, waxing and waning. Use of triamcinolone and miconazole prn. Need kenalog cream refilled.   Chronic Back and knee pain: Waxing and waning, worse in morning. Minimal improved with tylenol and hemp freeze cream OTC. Unable to use NSAIDs. She will like tramadol refill. Last filled 2017 by Dr. Carlota Raspberry Denies any new symptoms. Denies any fall.  HTN: Managed by cardiology. Upcoming appt next week Stable BP with hyzaar and furosemide. BP Readings from Last 3 Encounters:  03/30/18 134/80  10/24/17 120/74  04/21/17 118/78   DM: Managed by Dr. Buddy Duty. Last seen last week. HgbA1c and CMP done last week per patient.  Has not start any specific exercise regimen or diet as recommended by cardiologist and endocrinologist.  Reviewed social hx.  Outpatient Medications Prior to Visit  Medication Sig Dispense Refill  . b complex vitamins capsule Take 1 capsule by mouth daily.     . calcium carbonate (OS-CAL) 600 MG TABS Take 600 mg by mouth daily.     . cholecalciferol (VITAMIN D) 1000 UNITS tablet Take 1,000 Units by mouth daily.     [name] ELIQUIS 5 MG TABS tablet TAKE 1 TABLET BY MOUTH TWICE A DAY 180 tablet 1  . fluticasone (FLONASE) 50 MCG/ACT nasal spray Place 2 sprays into both nostrils daily. 16 g 0  . furosemide (LASIX) 40 MG tablet TAKE 1 TABLET (40 MG TOTAL) BY MOUTH DAILY. 90 tablet 3  . glucose blood test strip as directed.    [name] guaiFENesin (MUCINEX) 600 MG 12 hr tablet Take 1 tablet (600 mg total) by mouth 2 (two) times daily as needed. (Patient taking differently: Take 600 mg by mouth as needed. ) 14 tablet 0  . Insulin Isophane & Regular Human (NOVOLIN  70/30 FLEXPEN) (70-30) 100 UNIT/ML PEN Inject 28 Units into the skin every morning AND 24 Units daily after supper. 15 mL 11  . Insulin Pen Needle (PEN NEEDLES) 31G X 6 MM MISC 1 Units by Does not apply route daily. 100 each 3  . levothyroxine (SYNTHROID, LEVOTHROID) 137 MCG tablet Take 1 tablet (137 mcg total) by mouth daily before breakfast. 90 tablet 1  . losartan-hydrochlorothiazide (HYZAAR) 100-25 MG tablet TAKE 1 TABLET BY MOUTH DAILY. 90 tablet 3  . miconazole (MICOTIN) 2 % powder Apply topically as needed for itching. 70 g 0  . Multiple Vitamin (MULTIVITAMIN) tablet Take 1 tablet by mouth daily.     . potassium chloride SA (KLOR-CON M20) 20 MEQ tablet Take 1 tablet (20 mEq total) by mouth daily. Please schedule an appointment with our office to continuation of medication refills 90 tablet 3  . PROAIR HFA 108 (90 Base) MCG/ACT inhaler INHALE 2 PUFFS EVERY 4 HOURS AS NEEDED. 8.5 Inhaler 4  . rosuvastatin (CRESTOR) 5 MG tablet TAKE 1 TABLET BY MOUTH EVERY DAY 30 tablet 10  . SHINGRIX injection     . traMADol (ULTRAM) 50 MG tablet Take 1 tablet (50 mg total) by mouth every 8 (eight) hours as needed. 20 tablet 0  . triamcinolone cream (KENALOG) 0.1 % Apply 1 application topically 2 (two) times daily as needed. 30 g 1  . Omega-3 Fatty Acids (FISH OIL  PO) Take 1 tablet by mouth daily.      No facility-administered medications prior to visit.     ROS Review of Systems  Constitutional: Negative for malaise/fatigue and weight loss.  Respiratory: Negative for cough and shortness of breath.   Cardiovascular: Negative for chest pain and leg swelling.  Gastrointestinal: Negative for blood in stool, constipation, diarrhea and melena.  Genitourinary: Negative for dysuria, flank pain, frequency and urgency.  Musculoskeletal: Positive for back pain and joint pain. Negative for falls and neck pain.  Skin: Negative.     Objective:  BP 134/80   Pulse 94   Temp 98.1 F (36.7 C) (Oral)   Ht 5\' 3"   (1.6 m)   Wt 196 lb 9.6 oz (89.2 kg)   SpO2 98%   BMI 34.83 kg/m   BP Readings from Last 3 Encounters:  03/30/18 134/80  10/24/17 120/74  04/21/17 118/78    Wt Readings from Last 3 Encounters:  03/30/18 196 lb 9.6 oz (89.2 kg)  10/24/17 196 lb (88.9 kg)  04/21/17 192 lb (87.1 kg)    Physical Exam  Constitutional: She is oriented to person, place, and time. No distress.  Neck: Normal range of motion. Neck supple.  Cardiovascular: Normal rate and regular rhythm.  Pulmonary/Chest: Effort normal and breath sounds normal.  Abdominal: Soft. Bowel sounds are normal.  Musculoskeletal: She exhibits no edema or tenderness.       Right hip: Normal.       Left hip: Normal.       Right knee: Normal.       Left knee: Normal.       Right ankle: Normal.       Left ankle: Normal.       Lumbar back: Normal.       Right upper leg: Normal.       Left upper leg: Normal.       Right lower leg: Normal.       Left lower leg: Normal.  Neurological: She is alert and oriented to person, place, and time.  Skin: Skin is warm and dry. No rash noted.  Psychiatric: She has a normal mood and affect. Her behavior is normal. Thought content normal.  Vitals reviewed.   Lab Results  Component Value Date   WBC 6.8 10/24/2017   HGB 12.7 10/24/2017   HCT 39.8 10/24/2017   PLT 305.0 10/24/2017   GLUCOSE 84 10/24/2017   CHOL 176 10/24/2017   TRIG 94.0 10/24/2017   HDL 68.70 10/24/2017   LDLDIRECT 165.6 12/16/2011   LDLCALC 89 10/24/2017   ALT 24 10/24/2017   AST 19 10/24/2017   NA 142 10/24/2017   K 4.3 10/24/2017   CL 104 10/24/2017   CREATININE 0.75 10/24/2017   BUN 21 10/24/2017   CO2 30 10/24/2017   TSH 0.17 (L) 10/21/2016   INR 1.6 03/11/2015   HGBA1C 7.1 (A) 05/08/2015   MICROALBUR 0.1 10/02/2013    US Breast Ltd Uni Left Inc Axilla  Result Date: 10/28/2016 CLINICAL DATA:  77 year old female with palpable lump in the upper left breast discovered on clinical examination. EXAM: 2D  DIGITAL DIAGNOSTIC BILATERAL MAMMOGRAM WITH CAD AND ADJUNCT TOMO ULTRASOUND LEFT BREAST COMPARISON:  06/30/2008 ACR Breast Density Category a: The breast tissue is almost entirely fatty. FINDINGS: 2D and 3D full field views of both breasts and a spot compression view of the left breast demonstrate no suspicious mass, distortion or worrisome calcifications. Mammographic images were processed with CAD. On physical exam,  no palpable abnormalities are identified within the upper left breast. Targeted ultrasound is performed, showing no solid or cystic mass, distortion or abnormal shadowing within the upper left breast. IMPRESSION: No mammographic, palpable or sonographic abnormality within the upper left breast. No mammographic evidence of breast malignancy bilaterally. RECOMMENDATION: Bilateral screening mammograms in 1 year as clinically indicated. I have discussed the findings and recommendations with the patient. Results were also provided in writing at the conclusion of the visit. If applicable, a reminder letter will be sent to the patient regarding the next appointment. BI-RADS CATEGORY  1: Negative. Electronically Signed   By: Margarette Canada M.D.   On: 10/28/2016 14:14   Mm Diag Breast Tomo Bilateral  Result Date: 10/28/2016 CLINICAL DATA:  77 year old female with palpable lump in the upper left breast discovered on clinical examination. EXAM: 2D DIGITAL DIAGNOSTIC BILATERAL MAMMOGRAM WITH CAD AND ADJUNCT TOMO ULTRASOUND LEFT BREAST COMPARISON:  06/30/2008 ACR Breast Density Category a: The breast tissue is almost entirely fatty. FINDINGS: 2D and 3D full field views of both breasts and a spot compression view of the left breast demonstrate no suspicious mass, distortion or worrisome calcifications. Mammographic images were processed with CAD. On physical exam, no palpable abnormalities are identified within the upper left breast. Targeted ultrasound is performed, showing no solid or cystic mass, distortion or  abnormal shadowing within the upper left breast. IMPRESSION: No mammographic, palpable or sonographic abnormality within the upper left breast. No mammographic evidence of breast malignancy bilaterally. RECOMMENDATION: Bilateral screening mammograms in 1 year as clinically indicated. I have discussed the findings and recommendations with the patient. Results were also provided in writing at the conclusion of the visit. If applicable, a reminder letter will be sent to the patient regarding the next appointment. BI-RADS CATEGORY  1: Negative. Electronically Signed   By: Margarette Canada M.D.   On: 10/28/2016 14:14    Assessment & Plan:   Conda was seen today for follow-up and back pain.  Diagnoses and all orders for this visit:  Osteoarthritis of both hips, unspecified osteoarthritis type -     traMADol (ULTRAM) 50 MG tablet; Take 1 tablet (50 mg total) by mouth every 8 (eight) hours as needed for up to 3 days.  Candidiasis, intertrigo -     triamcinolone cream (KENALOG) 0.1 %; Apply 1 application topically 2 (two) times daily as needed.   I have changed Renee Sauer. Ray's traMADol. I am also having her maintain her multivitamin, b complex vitamins, cholecalciferol, calcium carbonate, glucose blood, Omega-3 Fatty Acids (FISH OIL PO), guaiFENesin, fluticasone, PROAIR HFA, SHINGRIX, miconazole, Pen Needles, losartan-hydrochlorothiazide, furosemide, Insulin Isophane & Regular Human, levothyroxine, ELIQUIS, potassium chloride SA, rosuvastatin, and triamcinolone cream.  Meds ordered this encounter  Medications  . triamcinolone cream (KENALOG) 0.1 %    Sig: Apply 1 application topically 2 (two) times daily as needed.    Dispense:  30 g    Refill:  1    Order Specific Question:   Supervising Provider    Answer:   Lucille Passy [[id number]]  . traMADol (ULTRAM) 50 MG tablet    Sig: Take 1 tablet (50 mg total) by mouth every 8 (eight) hours as needed for up to 3 days.    Dispense:  20 tablet    Refill:  0     Order Specific Question:   Supervising Provider    Answer:   Lucille Passy [[id number]]    Follow-up: Return in about 6 months (around 09/30/2018) for DM  and HTN, hyperlipidemia (fasting).  Wilfred Lacy, NP

## 2018-03-30 NOTE — Patient Instructions (Addendum)
Please have Dr. Buddy Duty fax recent lab results.

## 2018-04-01 ENCOUNTER — Other Ambulatory Visit: Payer: Self-pay | Admitting: Cardiovascular Disease

## 2018-04-04 ENCOUNTER — Ambulatory Visit (INDEPENDENT_AMBULATORY_CARE_PROVIDER_SITE_OTHER)
Admission: RE | Admit: 2018-04-04 | Discharge: 2018-04-04 | Disposition: A | Discharge status: Home / Self Care | Payer: Medicare Other | Source: Ambulatory Visit | Attending: Internal Medicine | Admitting: Internal Medicine

## 2018-04-04 ENCOUNTER — Ambulatory Visit: Payer: Medicare Other | Admitting: Internal Medicine

## 2018-04-04 ENCOUNTER — Encounter: Payer: Self-pay | Admitting: Internal Medicine

## 2018-04-04 VITALS — BP 112/78 | HR 85 | Ht 63.0 in | Wt 199.8 lb

## 2018-04-04 DIAGNOSIS — J449 Chronic obstructive pulmonary disease, unspecified: Secondary | ICD-10-CM

## 2018-04-04 DIAGNOSIS — E1169 Type 2 diabetes mellitus with other specified complication: Secondary | ICD-10-CM

## 2018-04-04 DIAGNOSIS — E669 Obesity, unspecified: Secondary | ICD-10-CM | POA: Diagnosis not present

## 2018-04-04 NOTE — Assessment & Plan Note (Signed)
She can discuss with her other providers how to manage blood sugar if she does need a steroid burst in the future.

## 2018-04-04 NOTE — Progress Notes (Signed)
HPI female former smoker followed for asthmatic bronchitis complicated by DM, CM, atrial fibrillation.  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 Herbie Baltimore Vanbuskirk/neurosurgeon Up-to-date on flu and pneumococcal vaccines Follows For: pt feels good overall however pt went to urgent care last week and you was diagonsed with Arthritis.pt was given meds that helped with the hip pain but has bad pain in knee and the meds isn't working   History Advil triggered severe asthma requiring hospitalization. No history of nasal polyps. Now on prednisone 40 mg daily 5 days for arthritis and hip. Concerned about left calf pain-lateral and anterior. Asks about blood clot although she is on Eliquis. Rarely needs rescue inhaler with no respiratory sleep disturbance. Office Spirometry 09/22/2016-moderate obstructive airways disease. FVC 2.13/80%, FEV1 1.23/62%, ratio 0.58, FEF 25-75 % 0.51/33%.  04/04/2018- 77 year old female former smoker followed for asthmatic bronchitis, complicated by DM, CM, atrial fibrillation Widow of Dr. Herbie Baltimore Lackie/neurosurgeon -----COPD mixed type: Last seen 09-2016. Pt states she has had slight SOB with hot weather other wise doing well.  Office Spirometry 04/04/2018-moderately severe obstruction.  FVC 1.96/75%, FEV1 1.05/54%, ratio 0.54, FEF 25-75% 0.43/29%. Little routine cough.  Still working 3 days a week.  Uses rescue inhaler 0-2 times daily.  Pursed lip breathing helps.  Has some leftover prednisone she uses rarely, noting that it raises her blood sugar. Pending follow-up with cardiology but denies chest pain or palpitation. Denies acute events and did not have significant respiratory  infection this winter.  ROS-see HPI + = positive Constitutional:   No-   weight loss, night sweats, +fevers, chills, fatigue, lassitude. HEENT:   No-  headaches, difficulty swallowing, tooth/dental problems, sore throat,       No-  sneezing, itching, ear ache, +nasal congestion, post nasal drip,  CV:  No-   chest pain, orthopnea, PND, swelling in lower extremities, anasarca, dizziness, palpitations Resp:  +Some shortness of breath with exertion or at rest.              No-productive cough,  No non-productive cough,  No- coughing up of blood.              No-   change in color of mucus.  + wheezing.   Skin: No-   rash or lesions. GI:  No-   heartburn, indigestion, abdominal pain, nausea, vomiting,  GU: MS:  No-   joint pain or swelling.   Neuro-     nothing unusual Psych:  No- change in mood or affect. No depression or anxiety.  No memory loss.  OBJ General- Alert, Oriented, Affect-appropriate, Distress- none acute.  + Overweight Skin- rash-none, lesions- none, excoriation- none; warm  Lymphadenopathy- none Head- atraumatic            Eyes- Gross vision intact, PERRLA, conjunctivae clear secretions            Ears- Hearing, canals-normal            Nose- Clear, no-Septal dev, mucus, polyps, erosion, perforation             Throat- Mallampati II , mucosa red , drainage- none, tonsils- atrophic Neck- flexible , trachea midline, no stridor , thyroid nl, carotid no bruit Chest - symmetrical excursion , unlabored           Heart/CV- + AF/ IRR , no murmur , no gallop  ,  no rub, nl s1 s2                           - JVD- none , edema- none, stasis changes- none, varices- none           Lung- + clear sounds/  distant, wheeze-none, unlabored, cough- none , dullness-none, rub- none           Chest wall-  Abd- Br/ Gen/ Rectal- Not done, not indicated Extrem- cyanosis- none, clubbing, none, atrophy- none, strength- nl, Neg Homan's Neuro- grossly intact to observation

## 2018-04-04 NOTE — Patient Instructions (Signed)
Order- CXR     Dx COPD mixed type  We can continue present meds  Please call if we can help

## 2018-04-04 NOTE — Assessment & Plan Note (Addendum)
Very stable COPD without recent exacerbation.  She is comfortable with her meds.  She may have borderline glaucoma so I decided not to add a LAMA class drug unless she gets worse.  Plan-update CXR, contact us for refill as needed.

## 2018-04-19 ENCOUNTER — Other Ambulatory Visit: Payer: Self-pay | Admitting: Nurse Practitioner

## 2018-04-19 DIAGNOSIS — E039 Hypothyroidism, unspecified: Secondary | ICD-10-CM

## 2018-04-19 MED ORDER — LEVOTHYROXINE SODIUM 137 MCG PO TABS: 137.0000 ug | ORAL_TABLET | Freq: Every day | ORAL | 0 refills | Status: DC

## 2018-04-24 ENCOUNTER — Ambulatory Visit: Payer: Medicare Other | Admitting: Nurse Practitioner

## 2018-04-24 ENCOUNTER — Encounter: Payer: Self-pay | Admitting: Nurse Practitioner

## 2018-04-24 NOTE — Progress Notes (Signed)
Abstracted result and sent to scan  

## 2018-05-04 ENCOUNTER — Ambulatory Visit: Payer: Medicare Other | Admitting: Cardiovascular Disease

## 2018-05-04 ENCOUNTER — Encounter: Payer: Self-pay | Admitting: Cardiovascular Disease

## 2018-05-04 ENCOUNTER — Encounter

## 2018-05-04 VITALS — BP 110/72 | HR 87 | Ht 63.0 in | Wt 197.0 lb

## 2018-05-04 DIAGNOSIS — I10 Essential (primary) hypertension: Secondary | ICD-10-CM

## 2018-05-04 DIAGNOSIS — E7800 Pure hypercholesterolemia, unspecified: Secondary | ICD-10-CM

## 2018-05-04 DIAGNOSIS — I481 Persistent atrial fibrillation: Secondary | ICD-10-CM

## 2018-05-04 DIAGNOSIS — Z7901 Long term (current) use of anticoagulants: Secondary | ICD-10-CM

## 2018-05-04 DIAGNOSIS — I4811 Longstanding persistent atrial fibrillation: Secondary | ICD-10-CM

## 2018-05-04 DIAGNOSIS — E1169 Type 2 diabetes mellitus with other specified complication: Secondary | ICD-10-CM

## 2018-05-04 DIAGNOSIS — D509 Iron deficiency anemia, unspecified: Secondary | ICD-10-CM

## 2018-05-04 DIAGNOSIS — E669 Obesity, unspecified: Secondary | ICD-10-CM

## 2018-05-04 NOTE — Progress Notes (Signed)
Patient ID: Renee Ray, female   DOB: Jul 01, 1941, 77 y.o.   MRN: [id number]    Cardiology Office Note    Date:  05/04/2018   ID:  Renee Ray, DOB 10/02/1941, MRN [id number]  PCP:  Flossie Buffy, NP  Cardiologist:   Sanda Klein, MD   Chief Complaint  Patient presents with  . Follow-up    Atrial fibrillation    History of Present Illness:  Renee Ray is a 77 y.o. female with long-standing persistent atrial fibrillation, essential hypertension with LVH, unexplained iron deficiency anemia.  Feels well and is active.  Still works part-time at [employer].  Exercises twice weekly.  The patient specifically denies any chest pain at rest exertion, dyspnea at rest or with exertion, orthopnea, paroxysmal nocturnal dyspnea, syncope, palpitations, focal neurological deficits, intermittent claudication, lower extremity edema, unexplained weight gain, cough, hemoptysis or wheezing.  She has not had any falls or any bleeding problems.  She is compliant with anticoagulation on Eliquis.  Past Medical History:  Diagnosis Date  . Abnormal echocardiogram 2010   showing slightly enlarged left atrium with normal LV function and normal PA pressures.   . Acute medial meniscal injury of knee    RIGHT KNEE  . Asthmatic bronchitis , chronic ([place])   . Atrial fibrillation ([place])   . Bruise    RIGHT ABD. DUE TO LOVENOX INJECTION  02-27-2012  . Chronic anticoagulation    coumadin  . Chronic atrial fibrillation ([place])    CARDIOLOGIST- DR BRACKBILL-- LAST VISIT  12-16-2011 IN [facility]  . Chronic renal insufficiency   . COPD, moderate ([place])    PULMOLOGIST- DR YOUNG --  LAST VISIT NOTE 02-23-2012 IN [facility]  . Diabetes mellitus    INSULIN AND ORAL MEDS  . Goiter 12/15   will have radiation in February  . History of pneumonia    associated w/  rhabdomyolysis  OCT 2012  . Hyperlipidemia   . Hypertension   . Non-ischemic cardiomyopathy ([place])    normal ef  . Normal nuclear stress test  2008  . OA (osteoarthritis) of knee    HANDS    Past Surgical History:  Procedure Laterality Date  . [place]--- BREAST IMPLANTS REMOVED   . KNEE ARTHROSCOPY  03/02/2012   Procedure: ARTHROSCOPY KNEE;  Surgeon: Gearlean Alf, MD;  Location: [facility];  Service: Orthopedics;  Laterality: Right;  WITH DEBRIDEMENT of medial menisces  . VAGINAL HYSTERECTOMY  1978    Current Medications: Outpatient Medications Prior to Visit  Medication Sig Dispense Refill  . b complex vitamins capsule Take 1 capsule by mouth daily.     . calcium carbonate (OS-CAL) 600 MG TABS Take 600 mg by mouth daily.     . cholecalciferol (VITAMIN D) 1000 UNITS tablet Take 1,000 Units by mouth daily.     [name] ELIQUIS 5 MG TABS tablet TAKE 1 TABLET BY MOUTH TWICE A DAY 180 tablet 1  . fluticasone (FLONASE) 50 MCG/ACT nasal spray Place 2 sprays into both nostrils daily. 16 g 0  . furosemide (LASIX) 40 MG tablet TAKE 1 TABLET (40 MG TOTAL) BY MOUTH DAILY. 90 tablet 3  . glucose blood test strip as directed.    [name] guaiFENesin (MUCINEX) 600 MG 12 hr tablet Take 1 tablet (600 mg total) by mouth 2 (two) times daily as needed. (Patient taking differently: Take 600 mg by mouth as needed. ) 14 tablet 0  . Insulin Isophane &  Regular Human (NOVOLIN 70/30 FLEXPEN) (70-30) 100 UNIT/ML PEN Inject 28 Units into the skin every morning AND 24 Units daily after supper. 15 mL 11  . Insulin Pen Needle (PEN NEEDLES) 31G X 6 MM MISC 1 Units by Does not apply route daily. 100 each 3  . levothyroxine (SYNTHROID, LEVOTHROID) 137 MCG tablet Take 1 tablet (137 mcg total) by mouth daily before breakfast. 90 tablet 0  . losartan-hydrochlorothiazide (HYZAAR) 100-25 MG tablet TAKE 1 TABLET BY MOUTH DAILY. 90 tablet 3  . miconazole (MICOTIN) 2 % powder Apply topically as needed for itching. 70 g 0  . Multiple Vitamin (MULTIVITAMIN) tablet Take 1 tablet by mouth daily.     . Omega-3 Fatty Acids (FISH OIL PO)  Take 1 tablet by mouth daily.     . potassium chloride SA (KLOR-CON M20) 20 MEQ tablet Take 1 tablet (20 mEq total) by mouth daily. Please schedule an appointment with our office to continuation of medication refills 90 tablet 3  . PROAIR HFA 108 (90 Base) MCG/ACT inhaler INHALE 2 PUFFS EVERY 4 HOURS AS NEEDED. 8.5 Inhaler 4  . rosuvastatin (CRESTOR) 5 MG tablet TAKE 1 TABLET BY MOUTH EVERY DAY 30 tablet 10  . triamcinolone cream (KENALOG) 0.1 % Apply 1 application topically 2 (two) times daily as needed. 30 g 1   No facility-administered medications prior to visit.      Allergies:   Advil [ibuprofen]; Janumet [sitagliptin-metformin hcl]; Amoxicillin; and Sulfonamide derivatives   Social History   Socioeconomic History  . Marital status: Widowed    Spouse name: Not on file  . Number of children: 1  . Years of education: Not on file  . Highest education level: Not on file  Occupational History    Comment: Part-Time at [employer]  . Financial resource strain: Not on file  . Food insecurity:    Worry: Not on file    Inability: Not on file  . Transportation needs:    Medical: Not on file    Non-medical: Not on file  Tobacco Use  . Smoking status: Former Smoker    Packs/day: 1.00    Years: 20.00    Pack years: 20.00    Types: Cigarettes    Last attempt to quit: 05/05/1996    Years since quitting: 22.0  . Smokeless tobacco: Never Used  Substance and Sexual Activity  . Alcohol use: No    Alcohol/week: 0.0 oz  . Drug use: No  . Sexual activity: Never  Lifestyle  . Physical activity:    Days per week: Not on file    Minutes per session: Not on file  . Stress: Not on file  Relationships  . Social connections:    Talks on phone: Not on file    Gets together: Not on file    Attends religious service: Not on file    Active member of club or organization: Not on file    Attends meetings of clubs or organizations: Not on file    Relationship status: Not on  file  Other Topics Concern  . Not on file  Social History Narrative  . Not on file     Family History:  The patient's family history includes Arrhythmia in her mother; Diabetes in her brother; Heart attack in her father; Heart disease in her brother; Heart failure in her mother; Stroke in her mother.   ROS:   Please see the history of present illness.    ROS All other  systems reviewed and are negative.   PHYSICAL EXAM:   VS:  BP 110/72   Pulse 87   Ht 5\' 3"  (1.6 m)   Wt 197 lb (89.4 kg)   BMI 34.90 kg/m     General: Alert, oriented x3, no distress, obese Head: no evidence of trauma, PERRL, EOMI, no exophtalmos or lid lag, no myxedema, no xanthelasma; normal ears, nose and oropharynx Neck: normal jugular venous pulsations and no hepatojugular reflux; brisk carotid pulses without delay and no carotid bruits Chest: clear to auscultation, no signs of consolidation by percussion or palpation, normal fremitus, symmetrical and full respiratory excursions Cardiovascular: normal position and quality of the apical impulse, irregular rhythm, normal first and second heart sounds, no murmurs, rubs or gallops Abdomen: no tenderness or distention, no masses by palpation, no abnormal pulsatility or arterial bruits, normal bowel sounds, no hepatosplenomegaly Extremities: no clubbing, cyanosis or edema; 2+ radial, ulnar and brachial pulses bilaterally; 2+ right femoral, posterior tibial and dorsalis pedis pulses; 2+ left femoral, posterior tibial and dorsalis pedis pulses; no subclavian or femoral bruits Neurological: grossly nonfocal Psych: Normal mood and affect   Wt Readings from Last 3 Encounters:  05/04/18 197 lb (89.4 kg)  04/04/18 199 lb 12.8 oz (90.6 kg)  03/30/18 196 lb 9.6 oz (89.2 kg)      Studies/Labs Reviewed:   EKG:  EKG is ordered today.  Shows atrial fibrillation with controlled ventricular response, mild left axis deviation, incomplete right bundle branch block, nonspecific T  wave changes, QTC 462 ms  Recent Labs: 10/24/2017: Hemoglobin 12.7; Platelets 305.0 03/29/2018: ALT 21; BUN 24; Creatinine 0.8; Potassium 4.5; Sodium 145   Lipid Panel    Component Value Date/Time   CHOL 175 03/29/2018   TRIG 205 (A) 03/29/2018   HDL 60 03/29/2018   CHOLHDL 3 10/24/2017 1148   VLDL 18.8 10/24/2017 1148   LDLCALC 75 03/29/2018   LDLDIRECT 165.6 12/16/2011 0942   ASSESSMENT:    1. Longstanding persistent atrial fibrillation ([place])   2. Essential hypertension   3. Hypercholesterolemia   4. Diabetes mellitus type 2 in obese ([place])   5. Iron deficiency anemia, unspecified iron deficiency anemia type   6. Long term current use of anticoagulant      PLAN:  In order of problems listed above:  1. AFIB, permanent: The rhythm is rate controlled, asymptomatic and she is appropriately anticoagulated. CHADSVasc 5 (age 13, HTN, DM, gender).  She appears to have some degree of underlying intraventricular conduction abnormality, but there is no indication for pacemaker.  This may become an issue we have years to come. 2. HTN: She has echocardiographic evidence of left ventricular hypertrophy but has normal left ventricular systolic function.  The report of diastolic dysfunction should be taken with some skepticism since it was made while she was in atrial fibrillation. 3. HLP: Without known vascular or coronary disease, LDL less than 100 is acceptable. 4. DM: Reports good control. 5. Anemia: Etiology of iron deficiency is unclear after a very benign extensive GI workup.  It seems that her anemia is not an issue as long as she stays on iron supplements.  No overt bleeding problems. 6. Anticoagulation is otherwise well tolerated without complications. Okay to stop anticoagulation briefly for surgical procedures or bleeding complications.    Medication Adjustments/Labs and Tests Ordered: Current medicines are reviewed at length with the patient today.  Concerns regarding medicines  are outlined above.  Medication changes, Labs and Tests ordered today are listed in the  Patient Instructions below. Patient Instructions  Dr Sallyanne Kuster recommends that you schedule a follow-up appointment in 12 months. You will receive a reminder letter in the mail two months in advance. If you don't receive a letter, please call our office to schedule the follow-up appointment.  If you need a refill on your cardiac medications before your next appointment, please call your pharmacy.    Signed, Sanda Klein, MD  05/04/2018 5:14 PM    [facility] [address], [place], [place]  [address] Phone: [phone]; Fax: [phone]

## 2018-05-04 NOTE — Patient Instructions (Signed)
Dr Croitoru recommends that you schedule a follow-up appointment in 12 months. You will receive a reminder letter in the mail two months in advance. If you don't receive a letter, please call our office to schedule the follow-up appointment.  If you need a refill on your cardiac medications before your next appointment, please call your pharmacy. 

## 2018-05-08 ENCOUNTER — Ambulatory Visit: Payer: Medicare Other | Admitting: Podiatry

## 2018-05-31 ENCOUNTER — Telehealth: Payer: Self-pay | Admitting: Cardiovascular Disease

## 2018-05-31 NOTE — Telephone Encounter (Signed)
New Message   Pt c/o swelling: STAT is pt has developed SOB within 24 hours  1) How much weight have you gained and in what time span? no  2) If swelling, where is the swelling located? Left leg and foot  3) Are you currently taking a fluid pill? yes  4) Are you currently SOB? Has an inhaler  5) Do you have a log of your daily weights (if so, list)? no  6) Have you gained 3 pounds in a day or 5 pounds in a week? no  7) Have you traveled recently? [place] the beach

## 2018-05-31 NOTE — Telephone Encounter (Signed)
Currently out of town at [employer] and c/o swelling in left foot and ankle for last couple of weeks. No c/o pain, tenderness or redness. Prior to swelling, c/o that she had a bad cramp in left calf. Patient said the swelling happens throughout the day. Upon waking up, the swelling is not as bad. Patient has full ROM of left lower extremity. No c/o dizziness, chest pain or sob, said she does have asthma and uses her inhaler in hot weather. Patient said she has been using ice and elevating her left leg that seems to help her symptoms. Patient advised that she should contact her PCP also and that this message would be sent to Dr. Sallyanne Kuster. Advised that if symptoms got worse, that she needed to go to the ED for an evaluation. Verbalized understanding of plan.

## 2018-05-31 NOTE — Telephone Encounter (Signed)
I worry about the fact that the swelling is asymmetrical and persistent. Concern about DVT and may benefit from a venous duplex US. Would also advise that she goes to the ED for any dyspnea, cough or chest discomfort with breathing. [place], please schedule for lower extremity venous duplex. MCr

## 2018-05-31 NOTE — Telephone Encounter (Signed)
Patient informed and [place]. Verbalized understanding of plan.

## 2018-06-04 ENCOUNTER — Other Ambulatory Visit: Payer: Self-pay | Admitting: Cardiovascular Disease

## 2018-06-05 NOTE — Telephone Encounter (Signed)
Rx request sent to pharmacy.  

## 2018-06-07 ENCOUNTER — Encounter

## 2018-06-07 ENCOUNTER — Encounter: Payer: Self-pay | Admitting: Podiatry

## 2018-06-07 ENCOUNTER — Ambulatory Visit: Payer: Medicare Other | Admitting: Podiatry

## 2018-06-07 DIAGNOSIS — D689 Coagulation defect, unspecified: Secondary | ICD-10-CM

## 2018-06-07 DIAGNOSIS — Q828 Other specified congenital malformations of skin: Secondary | ICD-10-CM

## 2018-06-09 NOTE — Progress Notes (Signed)
She presents today chief complaint of a soft tissue lesion medial aspect fifth digit right foot.  Lateral aspect fourth toe right foot.  Objective: Pulses are palpable.  Neurologic sensorium is intact adductovarus rotated hammertoe deformity fifth right resulting in reactive hyperkeratosis to the medial aspect of the fifth toe lateral aspect of the fourth toe due to the juxtaposition.  Assessment: Porokeratosis.  Plan: Debridement of reactive hyperkeratosis.  Placed padding.  Follow-up with her in a month.

## 2018-07-11 ENCOUNTER — Other Ambulatory Visit: Payer: Self-pay | Admitting: Nurse Practitioner

## 2018-07-11 DIAGNOSIS — E039 Hypothyroidism, unspecified: Secondary | ICD-10-CM

## 2018-07-11 NOTE — Telephone Encounter (Signed)
Patient is aware of rx sent and to stop by the lab before 90 days supply is gone.

## 2018-07-12 ENCOUNTER — Other Ambulatory Visit (INDEPENDENT_AMBULATORY_CARE_PROVIDER_SITE_OTHER): Payer: Medicare Other

## 2018-07-12 DIAGNOSIS — E039 Hypothyroidism, unspecified: Secondary | ICD-10-CM

## 2018-07-12 LAB — TSH: TSH: 2.88 u[IU]/mL (ref 0.35–4.50)

## 2018-07-12 LAB — T4, FREE: Free T4: 0.96 ng/dL (ref 0.60–1.60)

## 2018-07-13 MED ORDER — LEVOTHYROXINE SODIUM 137 MCG PO TABS: 137.0000 ug | ORAL_TABLET | Freq: Every day | ORAL | 3 refills | Status: DC

## 2018-07-13 NOTE — Addendum Note (Signed)
Addended by: Wilfred Lacy L on: 07/13/2018 09:06 AM   Modules accepted: Orders

## 2018-07-19 ENCOUNTER — Ambulatory Visit: Payer: Medicare Other | Admitting: Podiatry

## 2018-07-24 ENCOUNTER — Ambulatory Visit: Payer: Medicare Other | Admitting: Podiatry

## 2018-07-24 ENCOUNTER — Encounter: Payer: Self-pay | Admitting: Podiatry

## 2018-07-24 DIAGNOSIS — Q828 Other specified congenital malformations of skin: Secondary | ICD-10-CM

## 2018-07-24 DIAGNOSIS — D689 Coagulation defect, unspecified: Secondary | ICD-10-CM

## 2018-07-25 NOTE — Progress Notes (Signed)
She presents today for chief complaint of painful corn fifth digit right foot.  Objective: Vital signs are stable alert and oriented x3.  Adductovarus rotated hammertoe deformity fifth right with painful corn dorsal medial aspect no open lesions or wounds.  No signs of infection.  Assessment: Adductovarus rotated hammertoe deformity with medial corn.  Plan: Debrided reactive hyperkeratotic lesion.  Follow-up with me in 6 weeks

## 2018-08-31 ENCOUNTER — Other Ambulatory Visit: Payer: Self-pay | Admitting: Cardiovascular Disease

## 2018-09-06 ENCOUNTER — Ambulatory Visit: Payer: Medicare Other | Admitting: Podiatry

## 2018-09-06 ENCOUNTER — Encounter: Payer: Self-pay | Admitting: Podiatry

## 2018-09-06 DIAGNOSIS — D689 Coagulation defect, unspecified: Secondary | ICD-10-CM | POA: Diagnosis not present

## 2018-09-06 DIAGNOSIS — Q828 Other specified congenital malformations of skin: Secondary | ICD-10-CM | POA: Diagnosis not present

## 2018-09-06 NOTE — Progress Notes (Signed)
She presents today for follow-up of her painful porokeratotic lesion medial aspect fifth digit right foot.  Objective: Pulses are palpable.  Adductovarus rotated hammertoe deformity resulting in a juxtaposition of a reactive hyperkeratotic lesion medial aspect of the DIPJ fifth digit right foot.  No open lesions are noted.  Assessment: Adductovarus rotation and porokeratosis.  Plan: At this point I debrided the reactive hyperkeratotic lesion placed padding we will follow-up with her in 6 weeks.

## 2018-09-18 ENCOUNTER — Other Ambulatory Visit: Payer: Self-pay | Admitting: Cardiovascular Disease

## 2018-09-30 ENCOUNTER — Other Ambulatory Visit: Payer: Self-pay | Admitting: Cardiovascular Disease

## 2018-10-03 ENCOUNTER — Ambulatory Visit: Payer: Medicare Other | Admitting: Primary Care

## 2018-10-03 ENCOUNTER — Encounter: Payer: Self-pay | Admitting: Primary Care

## 2018-10-03 DIAGNOSIS — J449 Chronic obstructive pulmonary disease, unspecified: Secondary | ICD-10-CM

## 2018-10-03 MED ORDER — GLYCOPYRROLATE-FORMOTEROL 9-4.8 MCG/ACT IN AERO: 2.0000 | INHALATION_SPRAY | Freq: Two times a day (BID) | RESPIRATORY_TRACT | 1 refills | Status: DC

## 2018-10-03 NOTE — Progress Notes (Signed)
@Patient  ID: Renee Ray, female    DOB: 04/22/1941, 77 y.o.   MRN: [id number]  Chief Complaint  Patient presents with  . Acute Visit    SOB x1 month, back pain, little cough, sinus drainage, wheezing    Referring provider: Flossie Buffy, NP  HPI: 77 year old female, former smoker quit in 1997(20 pack year history). PMH COPD mixed type, asthmatic bronchitis, cardiomyopathy, HTN, afib (on Eliquis), DM 2, hypothyroidism. Patient of Dr. Annamaria Boots last seen on 04/04/18. Not on LAMA d/t borderline glaucoma.   10/03/2018 Patient presents today with increased shortness of breath on exertion over the last 1 month. She is not current on maintaince inhaler, has used Breo in the past. Uses Proair hfa as needed. States that she took 5mg  dose of prednisone that she has on hand with noticeable improvement in her breathing. Associated sinus drainage, mucus is clear. Feels back pain is from her arthritis. States that she has gained 4 pounds in the last 6 months. No recent weight gain or increased swelling. She is not aware of having diagnosis of glaucoma, believes she was told once that she had the beginning of cataracts. Denies fever or cough.   Testing: Office Spirometry 04/04/2018-moderately severe obstruction.  FVC 1.96/75%, FEV1 1.05/54%, ratio 0.54, FEF 25-75% 0.43/29%.  Allergies  Allergen Reactions  . Advil [Ibuprofen] Shortness Of Breath    Asthma  . Janumet [Sitagliptin-Metformin Hcl] Swelling  . Amoxicillin Diarrhea  . Sulfonamide Derivatives Other (See Comments)    REACTION: thrush    Immunization History  Administered Date(s) Administered  . Influenza Split 08/08/2011, 08/09/2012, 07/08/2013, 08/22/2015, 08/07/2017  . Influenza Whole 08/10/2010  . Influenza, High Dose Seasonal PF 08/17/2016, 08/11/2018  . Influenza,inj,Quad PF,6+ Mos 08/12/2014  . Pneumococcal Conjugate-13 09/05/2013  . Pneumococcal Polysaccharide-23 11/08/2007  . Zoster Recombinat (Shingrix) 04/20/2017,  07/21/2017    Past Medical History:  Diagnosis Date  . Abnormal echocardiogram 2010   showing slightly enlarged left atrium with normal LV function and normal PA pressures.   . Acute medial meniscal injury of knee    RIGHT KNEE  . Asthmatic bronchitis , chronic ([place])   . Atrial fibrillation ([place])   . Bruise    RIGHT ABD. DUE TO LOVENOX INJECTION  02-27-2012  . Chronic anticoagulation    coumadin  . Chronic atrial fibrillation    CARDIOLOGIST- DR BRACKBILL-- LAST VISIT  12-16-2011 IN [facility]  . Chronic renal insufficiency   . COPD, moderate ([place])    PULMOLOGIST- DR YOUNG --  LAST VISIT NOTE 02-23-2012 IN [facility]  . Diabetes mellitus    INSULIN AND ORAL MEDS  . Goiter 12/15   will have radiation in February  . History of pneumonia    associated w/  rhabdomyolysis  OCT 2012  . Hyperlipidemia   . Hypertension   . Non-ischemic cardiomyopathy ([place])    normal ef  . Normal nuclear stress test 2008  . OA (osteoarthritis) of knee    HANDS    Tobacco History: Social History   Tobacco Use  Smoking Status Former Smoker  . Packs/day: 1.00  . Years: 20.00  . Pack years: 20.00  . Types: Cigarettes  . Last attempt to quit: 05/05/1996  . Years since quitting: 22.4  Smokeless Tobacco Never Used   Counseling given: Not Answered   Outpatient Medications Prior to Visit  Medication Sig Dispense Refill  . b complex vitamins capsule Take 1 capsule by mouth daily.     . calcium carbonate (OS-CAL) 600  MG TABS Take 600 mg by mouth daily.     . cholecalciferol (VITAMIN D) 1000 UNITS tablet Take 1,000 Units by mouth daily.     [name] ELIQUIS 5 MG TABS tablet TAKE 1 TABLET BY MOUTH TWICE A DAY 180 tablet 1  . fluticasone (FLONASE) 50 MCG/ACT nasal spray Place 2 sprays into both nostrils daily. 16 g 0  . furosemide (LASIX) 40 MG tablet TAKE 1 TABLET (40 MG TOTAL) BY MOUTH DAILY. 90 tablet 3  . glucose blood test strip as directed.    [name] guaiFENesin (MUCINEX) 600 MG 12 hr tablet Take 1 tablet (600 mg  total) by mouth 2 (two) times daily as needed. (Patient taking differently: Take 600 mg by mouth as needed. ) 14 tablet 0  . hydrochlorothiazide (HYDRODIURIL) 25 MG tablet     . Insulin Isophane & Regular Human (NOVOLIN 70/30 FLEXPEN) (70-30) 100 UNIT/ML PEN Inject 28 Units into the skin every morning AND 24 Units daily after supper. 15 mL 11  . Insulin Pen Needle (PEN NEEDLES) 31G X 6 MM MISC 1 Units by Does not apply route daily. 100 each 3  . levothyroxine (SYNTHROID, LEVOTHROID) 137 MCG tablet Take 1 tablet (137 mcg total) by mouth daily before breakfast. 90 tablet 3  . losartan (COZAAR) 100 MG tablet Please specify directions, refills and quantity 1 tablet 0  . miconazole (MICOTIN) 2 % powder Apply topically as needed for itching. 70 g 0  . Multiple Vitamin (MULTIVITAMIN) tablet Take 1 tablet by mouth daily.     . Omega-3 Fatty Acids (FISH OIL PO) Take 1 tablet by mouth daily.     . potassium chloride SA (KLOR-CON M20) 20 MEQ tablet Take 1 tablet (20 mEq total) by mouth daily. Please schedule an appointment with our office to continuation of medication refills 90 tablet 3  . PROAIR HFA 108 (90 Base) MCG/ACT inhaler INHALE 2 PUFFS EVERY 4 HOURS AS NEEDED. 8.5 Inhaler 4  . rosuvastatin (CRESTOR) 5 MG tablet TAKE 1 TABLET BY MOUTH EVERY DAY 30 tablet 10  . triamcinolone cream (KENALOG) 0.1 % Apply 1 application topically 2 (two) times daily as needed. 30 g 1   No facility-administered medications prior to visit.     Review of Systems  Review of Systems  Constitutional: Negative.   HENT: Positive for postnasal drip.   Respiratory: Positive for shortness of breath. Negative for cough and wheezing.   Cardiovascular: Negative.    Physical Exam  BP 118/64 (BP Location: Left Arm, Cuff Size: Normal)   Pulse 94   Temp 97.7 F (36.5 C)   Ht 5\' 3"  (1.6 m)   Wt 202 lb (91.6 kg)   SpO2 97%   BMI 35.78 kg/m  Physical Exam  Constitutional: She is oriented to person, place, and time. She  appears well-developed and well-nourished. No distress.  HENT:  Head: Normocephalic and atraumatic.  Eyes: Pupils are equal, round, and reactive to light. EOM are normal.  Neck: Normal range of motion. Neck supple.  Cardiovascular: Normal rate.  Irregular rhythm   Pulmonary/Chest: Effort normal and breath sounds normal.  Musculoskeletal: Normal range of motion.  Neurological: She is alert and oriented to person, place, and time.  Skin: Skin is warm and dry.  Psychiatric: She has a normal mood and affect. Her behavior is normal. Judgment and thought content normal.     Lab Results:  CBC    Component Value Date/Time   WBC 6.8 10/24/2017 1148   RBC 4.41 10/24/2017  1148   HGB 12.7 10/24/2017 1148   HCT 39.8 10/24/2017 1148   PLT 305.0 10/24/2017 1148   MCV 90.3 10/24/2017 1148   MCV 71.8 (A) 08/13/2014 1538   MCH 29.7 12/08/2016 1229   MCHC 31.9 10/24/2017 1148   RDW 14.2 10/24/2017 1148   LYMPHSABS 1.3 09/22/2016 1116   MONOABS 0.9 09/22/2016 1116   EOSABS 0.2 09/22/2016 1116   BASOSABS 0.0 09/22/2016 1116    BMET    Component Value Date/Time   NA 145 03/29/2018   K 4.5 03/29/2018   CL 104 10/24/2017 1148   CO2 30 10/24/2017 1148   GLUCOSE 84 10/24/2017 1148   BUN 24 (A) 03/29/2018   CREATININE 0.8 03/29/2018   CREATININE 0.75 10/24/2017 1148   CREATININE 0.82 12/02/2015 1056   CALCIUM 9.3 10/24/2017 1148   GFRNONAA 23 (L) 08/15/2011 0620   GFRAA 27 (L) 08/15/2011 0620    Imaging: No results found.   Assessment & Plan:   COPD mixed type ([place]) - Worsening dyspnea over the past month - Not on maintenance inhaler - Uses proair hf as needed and occasional prednisone with improvement  - Adding LAMA/LABA, instructed to check with opthalmology to ensure she does not have diagnosis or glaucoma  - Ok to take prednisone 5mg  x 5 days, she does not need refill (instructed to monitor blood sugar) - FU in 4-6 weeks    Martyn Ehrich, NP 10/03/2018

## 2018-10-03 NOTE — Patient Instructions (Addendum)
Rx: Start Bevespi daily inhaler - take 2 puffs twice daily (take this every day)  Please check with ophthalmologist make sure you do not have a diagnosis of glaucoma, if you do please do not start inhaler regimen and call to let us know  Okay to take prednisone 5 mg daily for 5 days (monitor blood sugars, elevated contact PCP)  Medications: Flonase spray daily till nasal symptoms improve Claritin daily as needed for allergy symptoms  Follow-up: 4 to 6 weeks with Beth NP or Dr. Annamaria Boots Return sooner if symptoms worsen, develop fever or productive cough

## 2018-10-03 NOTE — Assessment & Plan Note (Addendum)
-   Worsening dyspnea over the past month - Not on maintenance inhaler - Uses proair hf as needed and occasional prednisone with improvement  - Adding LAMA/LABA, instructed to check with opthalmology to ensure she does not have diagnosis or glaucoma  - Ok to take prednisone 5mg  x 5 days, she does not need refill (instructed to monitor blood sugar) - FU in 4-6 weeks

## 2018-10-09 ENCOUNTER — Encounter: Payer: Self-pay | Admitting: Nurse Practitioner

## 2018-10-09 ENCOUNTER — Ambulatory Visit (INDEPENDENT_AMBULATORY_CARE_PROVIDER_SITE_OTHER): Payer: Medicare Other | Admitting: Nurse Practitioner

## 2018-10-09 VITALS — BP 130/74 | HR 87 | Temp 97.7°F | Ht 63.0 in | Wt 200.0 lb

## 2018-10-09 DIAGNOSIS — E7800 Pure hypercholesterolemia, unspecified: Secondary | ICD-10-CM

## 2018-10-09 DIAGNOSIS — E1169 Type 2 diabetes mellitus with other specified complication: Secondary | ICD-10-CM

## 2018-10-09 DIAGNOSIS — I10 Essential (primary) hypertension: Secondary | ICD-10-CM

## 2018-10-09 DIAGNOSIS — E669 Obesity, unspecified: Secondary | ICD-10-CM | POA: Diagnosis not present

## 2018-10-09 DIAGNOSIS — E039 Hypothyroidism, unspecified: Secondary | ICD-10-CM

## 2018-10-09 DIAGNOSIS — M545 Low back pain, unspecified: Secondary | ICD-10-CM

## 2018-10-09 DIAGNOSIS — D509 Iron deficiency anemia, unspecified: Secondary | ICD-10-CM | POA: Diagnosis not present

## 2018-10-09 DIAGNOSIS — B372 Candidiasis of skin and nail: Secondary | ICD-10-CM

## 2018-10-09 DIAGNOSIS — M160 Bilateral primary osteoarthritis of hip: Secondary | ICD-10-CM

## 2018-10-09 DIAGNOSIS — G8929 Other chronic pain: Secondary | ICD-10-CM

## 2018-10-09 LAB — T4, FREE: FREE T4: 0.9 ng/dL (ref 0.60–1.60)

## 2018-10-09 LAB — COMPREHENSIVE METABOLIC PANEL
ALBUMIN: 4.2 g/dL (ref 3.5–5.2)
ALT: 24 U/L (ref 0–35)
AST: 19 U/L (ref 0–37)
Alkaline Phosphatase: 80 U/L (ref 39–117)
BUN: 24 mg/dL — ABNORMAL HIGH (ref 6–23)
CALCIUM: 9.7 mg/dL (ref 8.4–10.5)
CO2: 30 mEq/L (ref 19–32)
CREATININE: 0.79 mg/dL (ref 0.40–1.20)
Chloride: 102 mEq/L (ref 96–112)
GFR: 74.77 mL/min (ref >60.00)
Glucose, Bld: 164 mg/dL — ABNORMAL HIGH (ref 70–99)
Potassium: 3.7 mEq/L (ref 3.5–5.1)
SODIUM: 142 meq/L (ref 135–145)
Total Bilirubin: 0.5 mg/dL (ref 0.2–1.2)
Total Protein: 6.9 g/dL (ref 6.0–8.3)

## 2018-10-09 LAB — CBC WITH DIFFERENTIAL/PLATELET
Basophils Absolute: 0 10*3/uL (ref 0.0–0.1)
Basophils Relative: 0.2 % (ref 0.0–3.0)
EOS PCT: 0.5 % (ref 0.0–5.0)
Eosinophils Absolute: 0 10*3/uL (ref 0.0–0.7)
HCT: 38.9 % (ref 36.0–46.0)
Hemoglobin: 12.7 g/dL (ref 12.0–15.0)
Lymphocytes Relative: 12 % (ref 12.0–46.0)
Lymphs Abs: 0.9 10*3/uL (ref 0.7–4.0)
MCHC: 32.6 g/dL (ref 30.0–36.0)
MCV: 86.5 fl (ref 78.0–100.0)
Monocytes Absolute: 0.5 10*3/uL (ref 0.1–1.0)
Monocytes Relative: 6.3 % (ref 3.0–12.0)
Neutro Abs: 6.3 10*3/uL (ref 1.4–7.7)
Neutrophils Relative %: 81 % — ABNORMAL HIGH (ref 43.0–77.0)
Platelets: 310 10*3/uL (ref 150.0–400.0)
RBC: 4.49 Mil/uL (ref 3.87–5.11)
RDW: 14.7 % (ref 11.5–15.5)
WBC: 7.8 10*3/uL (ref 4.0–10.5)

## 2018-10-09 LAB — LIPID PANEL
Cholesterol: 185 mg/dL (ref 0–200)
HDL: 74.5 mg/dL (ref >39.00)
LDL Cholesterol: 96 mg/dL (ref 0–99)
NonHDL: 110.89
Total CHOL/HDL Ratio: 2
Triglycerides: 75 mg/dL (ref 0.0–149.0)
VLDL: 15 mg/dL (ref 0.0–40.0)

## 2018-10-09 LAB — MICROALBUMIN / CREATININE URINE RATIO
Creatinine,U: 15.2 mg/dL
Microalb Creat Ratio: 4.6 mg/g (ref 0.0–30.0)
Microalb, Ur: <0.7 mg/dL (ref 0.0–1.9)

## 2018-10-09 LAB — IBC PANEL
Iron: 68 ug/dL (ref 42–145)
SATURATION RATIOS: 15 % — AB (ref 20.0–50.0)
Transferrin: 323 mg/dL (ref 212.0–360.0)

## 2018-10-09 LAB — TSH: TSH: 2.38 u[IU]/mL (ref 0.35–4.50)

## 2018-10-09 MED ORDER — TRAMADOL HCL 50 MG PO TABS: 50.0000 mg | ORAL_TABLET | Freq: Two times a day (BID) | ORAL | 0 refills | Status: AC | PRN

## 2018-10-09 MED ORDER — TRIAMCINOLONE ACETONIDE 0.1 % EX CREA: 1.0000 "application " | TOPICAL_CREAM | Freq: Two times a day (BID) | CUTANEOUS | 1 refills | Status: DC | PRN

## 2018-10-09 NOTE — Patient Instructions (Addendum)
Sign medical release to get records from endocrinology with [facility] and from ophthalmology.  Go to lab for blood draw and urine collection.

## 2018-10-09 NOTE — Progress Notes (Signed)
Subjective:  Patient ID: Renee Ray, female    DOB: [birth year]  Age: 77 y.o. MRN: [id number]  CC: Follow-up (6 mo DM,HTN,lipid/ fasting/ will have eye report fax to [place]. )  HPI  DM: Managed by Dr. Buddy Duty with [facility], last OV 10/02/2018,  normal foot exam per patient. Last hgbA1c 7.7 per patient Today glucose of 145 (fasting). No hypoglycemia, no paresthesia Last eye exam in 08/2018: normal diabetic retinopathy per patient, mild bilateral cataracts. No glaucoma per patient. Dental cleaning every 4months  Chronic Back and knee pain: Waxing and waning, worse in morning. Minimal improved with tylenol and hemp freeze cream OTC. Unable to use NSAIDs due to use of eliquis and asthma She will like tramadol refill. Last filled 03/30/2018 Denies any new symptoms. Denies any fall.  A-Fib and HTN:  BP at goal with losartan and furosemide Persistent, Rate controlled Use of eliquis, no sign of acute bleed. No edema managed by cardiology: Dr. Sallyanne Kuster  Hypothyroidism: Stable function with levothyroxine 165mcg.  COPD mixed: Current use of oral prednisone and Bevespi inhaler. Managed by pulmonology: Dr. Volanda Napoleon  Reviewed past Medical, Social and Family history today.  Outpatient Medications Prior to Visit  Medication Sig Dispense Refill  . b complex vitamins capsule Take 1 capsule by mouth daily.     . calcium carbonate (OS-CAL) 600 MG TABS Take 600 mg by mouth daily.     . cholecalciferol (VITAMIN D) 1000 UNITS tablet Take 1,000 Units by mouth daily.     [name] ELIQUIS 5 MG TABS tablet TAKE 1 TABLET BY MOUTH TWICE A DAY 180 tablet 1  . fluticasone (FLONASE) 50 MCG/ACT nasal spray Place 2 sprays into both nostrils daily. 16 g 0  . furosemide (LASIX) 40 MG tablet TAKE 1 TABLET (40 MG TOTAL) BY MOUTH DAILY. 90 tablet 3  . glucose blood test strip as directed.    . Glycopyrrolate-Formoterol (BEVESPI AEROSPHERE) 9-4.8 MCG/ACT AERO Inhale 2 puffs into the lungs 2 (two) times daily.  10.7 g 1  . guaiFENesin (MUCINEX) 600 MG 12 hr tablet Take 1 tablet (600 mg total) by mouth 2 (two) times daily as needed. (Patient taking differently: Take 600 mg by mouth as needed. ) 14 tablet 0  . hydrochlorothiazide (HYDRODIURIL) 25 MG tablet     . Insulin Isophane & Regular Human (NOVOLIN 70/30 FLEXPEN) (70-30) 100 UNIT/ML PEN Inject 28 Units into the skin every morning AND 24 Units daily after supper. 15 mL 11  . Insulin Pen Needle (PEN NEEDLES) 31G X 6 MM MISC 1 Units by Does not apply route daily. 100 each 3  . levothyroxine (SYNTHROID, LEVOTHROID) 137 MCG tablet Take 1 tablet (137 mcg total) by mouth daily before breakfast. 90 tablet 3  . losartan (COZAAR) 100 MG tablet Please specify directions, refills and quantity 1 tablet 0  . miconazole (MICOTIN) 2 % powder Apply topically as needed for itching. 70 g 0  . Multiple Vitamin (MULTIVITAMIN) tablet Take 1 tablet by mouth daily.     . Omega-3 Fatty Acids (FISH OIL PO) Take 1 tablet by mouth daily.     . potassium chloride SA (KLOR-CON M20) 20 MEQ tablet Take 1 tablet (20 mEq total) by mouth daily. Please schedule an appointment with our office to continuation of medication refills 90 tablet 3  . predniSONE (DELTASONE) 5 MG tablet Take 5 mg by mouth daily with breakfast. For 5 days.    [name] PROAIR HFA 108 (90 Base) MCG/ACT inhaler INHALE 2 PUFFS EVERY  4 HOURS AS NEEDED. 8.5 Inhaler 4  . rosuvastatin (CRESTOR) 5 MG tablet TAKE 1 TABLET BY MOUTH EVERY DAY 30 tablet 10  . triamcinolone cream (KENALOG) 0.1 % Apply 1 application topically 2 (two) times daily as needed. 30 g 1  . metFORMIN (GLUCOPHAGE-XR) 500 MG 24 hr tablet Take 2,000 mg by mouth at bedtime.     No facility-administered medications prior to visit.     ROS See HPI  Objective:  BP 130/74   Pulse 87   Temp 97.7 F (36.5 C) (Oral)   Ht 5\' 3"  (1.6 m)   Wt 200 lb (90.7 kg)   SpO2 95%   BMI 35.43 kg/m   BP Readings from Last 3 Encounters:  10/09/18 130/74  10/03/18  118/64  05/04/18 110/72    Wt Readings from Last 3 Encounters:  10/09/18 200 lb (90.7 kg)  10/03/18 202 lb (91.6 kg)  05/04/18 197 lb (89.4 kg)    Physical Exam  Constitutional: She is oriented to person, place, and time.  Cardiovascular: Normal rate and normal heart sounds. An irregularly irregular rhythm present.  Pulmonary/Chest: Effort normal and breath sounds normal.  Abdominal: Soft. Bowel sounds are normal.  Musculoskeletal: She exhibits no edema.  Neurological: She is alert and oriented to person, place, and time.  Skin: No erythema.  Psychiatric: She has a normal mood and affect. Her behavior is normal. Thought content normal.  Vitals reviewed.   Lab Results  Component Value Date   WBC 7.8 10/09/2018   HGB 12.7 10/09/2018   HCT 38.9 10/09/2018   PLT 310.0 10/09/2018   GLUCOSE 164 (H) 10/09/2018   CHOL 185 10/09/2018   TRIG 75.0 10/09/2018   HDL 74.50 10/09/2018   LDLDIRECT 165.6 12/16/2011   LDLCALC 96 10/09/2018   ALT 24 10/09/2018   AST 19 10/09/2018   NA 142 10/09/2018   K 3.7 10/09/2018   CL 102 10/09/2018   CREATININE 0.79 10/09/2018   BUN 24 (H) 10/09/2018   CO2 30 10/09/2018   TSH 2.38 10/09/2018   INR 1.6 03/11/2015   HGBA1C 7.1 (A) 05/08/2015   MICROALBUR <0.7 10/09/2018    Dg Chest 2 View  Result Date: 04/04/2018 CLINICAL DATA:  COPD, atrial fibrillation, former smoker. Surveillance follow-up exam. EXAM: CHEST - 2 VIEW COMPARISON:  Chest x-ray of September 23, 2014 FINDINGS: The lungs are well-expanded. The interstitial markings are coarse though stable. There is no alveolar infiltrate or pleural effusion. The cardiac silhouette is mildly enlarged. The pulmonary vascularity is not engorged. There is calcification in the wall of the aortic arch. The bony thorax exhibits no acute abnormality. IMPRESSION: COPD.  Mild cardiomegaly without pulmonary edema. Thoracic aortic atherosclerosis. Electronically Signed   By: David  Martinique M.D.   On: 04/04/2018  11:03    Assessment & Plan:   Paije was seen today for follow-up.  Diagnoses and all orders for this visit:  Essential hypertension -     Comprehensive metabolic panel  Diabetes mellitus type 2 in obese (HCC) -     Microalbumin / creatinine urine ratio -     Comprehensive metabolic panel  Hypercholesterolemia -     Comprehensive metabolic panel -     Lipid panel  Hypothyroidism, unspecified type -     TSH -     T4, free  Iron deficiency anemia, unspecified iron deficiency anemia type -     IBC panel -     CBC w/Diff  Osteoarthritis of both hips, unspecified osteoarthritis  type -     traMADol (ULTRAM) 50 MG tablet; Take 1 tablet (50 mg total) by mouth every 12 (twelve) hours as needed for up to 5 days.  Chronic bilateral low back pain without sciatica -     traMADol (ULTRAM) 50 MG tablet; Take 1 tablet (50 mg total) by mouth every 12 (twelve) hours as needed for up to 5 days.  Candidiasis, intertrigo -     triamcinolone cream (KENALOG) 0.1 %; Apply 1 application topically 2 (two) times daily as needed.   I am having Elizabeth Sauer. Polidori start on traMADol. I am also having her maintain her multivitamin, b complex vitamins, cholecalciferol, calcium carbonate, glucose blood, Omega-3 Fatty Acids (FISH OIL PO), guaiFENesin, fluticasone, PROAIR HFA, miconazole, Pen Needles, Insulin Isophane & Regular Human, potassium chloride SA, rosuvastatin, levothyroxine, losartan, hydrochlorothiazide, furosemide, ELIQUIS, Glycopyrrolate-Formoterol, predniSONE, metFORMIN, and triamcinolone cream.  Meds ordered this encounter  Medications  . traMADol (ULTRAM) 50 MG tablet    Sig: Take 1 tablet (50 mg total) by mouth every 12 (twelve) hours as needed for up to 5 days.    Dispense:  14 tablet    Refill:  0    Order Specific Question:   Supervising Provider    Answer:   MATTHEWS, CODY [[id number]]  . triamcinolone cream (KENALOG) 0.1 %    Sig: Apply 1 application topically 2 (two) times daily as  needed.    Dispense:  80 g    Refill:  1    Order Specific Question:   Supervising Provider    Answer:   MATTHEWS, CODY [[id number]]    Problem List Items Addressed This Visit      Cardiovascular and Mediastinum   Essential hypertension - Primary   Relevant Orders   Comprehensive metabolic panel (Completed)     Endocrine   Diabetes mellitus type 2 in obese (HCC)   Relevant Medications   metFORMIN (GLUCOPHAGE-XR) 500 MG 24 hr tablet   Other Relevant Orders   Microalbumin / creatinine urine ratio (Completed)   Comprehensive metabolic panel (Completed)   Hypothyroidism   Relevant Orders   TSH (Completed)   T4, free (Completed)     Other   Hypercholesterolemia   Relevant Orders   Comprehensive metabolic panel (Completed)   Lipid panel (Completed)   Iron deficiency anemia   Relevant Orders   IBC panel (Completed)   CBC w/Diff (Completed)    Other Visit Diagnoses    Osteoarthritis of both hips, unspecified osteoarthritis type       Relevant Medications   predniSONE (DELTASONE) 5 MG tablet   traMADol (ULTRAM) 50 MG tablet   Chronic bilateral low back pain without sciatica       Relevant Medications   predniSONE (DELTASONE) 5 MG tablet   traMADol (ULTRAM) 50 MG tablet   Candidiasis, intertrigo       Relevant Medications   triamcinolone cream (KENALOG) 0.1 %       Follow-up: Return in about 6 months (around 04/10/2019) for DM and HTN, hyperlipidemia (fasting).  Wilfred Lacy, NP

## 2018-10-10 ENCOUNTER — Encounter: Payer: Self-pay | Admitting: Nurse Practitioner

## 2018-10-18 ENCOUNTER — Ambulatory Visit: Payer: Medicare Other | Admitting: Podiatry

## 2018-11-01 ENCOUNTER — Ambulatory Visit: Payer: Medicare Other | Admitting: Primary Care

## 2018-11-13 ENCOUNTER — Encounter: Payer: Self-pay | Admitting: Primary Care

## 2018-11-13 ENCOUNTER — Ambulatory Visit: Payer: Medicare Other | Admitting: Primary Care

## 2018-11-13 DIAGNOSIS — J449 Chronic obstructive pulmonary disease, unspecified: Secondary | ICD-10-CM

## 2018-11-13 MED ORDER — FLUTICASONE-UMECLIDIN-VILANT 100-62.5-25 MCG/INH IN AEPB: 1.0000 | INHALATION_SPRAY | Freq: Every day | RESPIRATORY_TRACT | 0 refills | Status: DC

## 2018-11-13 NOTE — Assessment & Plan Note (Signed)
-   Moderate obstruction on spirometry  - No noticeable benefit from bevespi - Trial Trelegy 1 puff daily (sample given)- instructed to call office if helpful and will send in [place] as needed for sob/wheezing  - FU in 3 months with Dr. Annamaria Boots

## 2018-11-13 NOTE — Progress Notes (Signed)
@Patient  ID: Rudi Heap, female    DOB: 1941/03/24, 78 y.o.   MRN: [id number]  Chief Complaint  Patient presents with  . Follow-up    She was started on Bevespi at last however she cant tell a difference. States she does feel better since completing prednisone and antibiotic but she still is wheezing and SOB.     Referring provider: Flossie Buffy, NP  HPI: 78 year old female, former smoker quit in [phone] pack year history). PMH COPD mixed type, asthmatic bronchitis, cardiomyopathy, HTN, afib (on Eliquis), DM 2, hypothyroidism. Patient of Dr. Annamaria Boots last seen on 04/04/18. Not on LAMA d/t ? borderline glaucoma.   10/03/2018 Patient presents today with increased shortness of breath on exertion over the last 1 month. She is not current on maintaince inhaler, has used Breo in the past. Uses Proair hfa as needed. States that she took 5mg  dose of prednisone that she has on hand with noticeable improvement in her breathing. Associated sinus drainage, mucus is clear. Feels back pain is from her arthritis. States that she has gained 4 pounds in the last 6 months. No recent weight gain or increased swelling. She is not aware of having diagnosis of glaucoma, believes she was told once that she had the beginning of cataracts. Denies fever or cough. She was started on Bevespi 2 puffs twice daily for worsening dyspnea over the past month.  11/13/2018 Patient presents today for 4-6 week follow-up. States that it's hard to tell if Bevespi helped. She had sinus and chest congestion recently. She treated her self with mucinex and tesslone perles. Feelings better. She is diabetic, occasionally takes 1 tab of prednisone if needed and that helps. Still short of breath, learned to do pursed lip breathing   Testing: Office Spirometry 04/04/2018-moderately severe obstruction.  FVC 1.96/75%, FEV1 1.05/54%, ratio 0.54, FEF 25-75% 0.43/29%.  Allergies  Allergen Reactions  . Advil [Ibuprofen] Shortness Of  Breath    Asthma  . Janumet [Sitagliptin-Metformin Hcl] Swelling  . Amoxicillin Diarrhea  . Sulfonamide Derivatives Other (See Comments)    REACTION: thrush    Immunization History  Administered Date(s) Administered  . Influenza Split 08/08/2011, 08/09/2012, 07/08/2013, 08/22/2015, 08/07/2017  . Influenza Whole 08/10/2010  . Influenza, High Dose Seasonal PF 08/17/2016, 08/11/2018  . Influenza,inj,Quad PF,6+ Mos 08/12/2014  . Pneumococcal Conjugate-13 09/05/2013  . Pneumococcal Polysaccharide-23 11/08/2007  . Zoster Recombinat (Shingrix) 04/20/2017, 07/21/2017    Past Medical History:  Diagnosis Date  . Abnormal echocardiogram 2010   showing slightly enlarged left atrium with normal LV function and normal PA pressures.   . Acute medial meniscal injury of knee    RIGHT KNEE  . Asthmatic bronchitis , chronic ([place])   . Atrial fibrillation ([place])   . Bruise    RIGHT ABD. DUE TO LOVENOX INJECTION  02-27-2012  . Chronic anticoagulation    coumadin  . Chronic atrial fibrillation    CARDIOLOGIST- DR BRACKBILL-- LAST VISIT  12-16-2011 IN [facility]  . Chronic renal insufficiency   . COPD, moderate ([place])    PULMOLOGIST- DR YOUNG --  LAST VISIT NOTE 02-23-2012 IN [facility]  . Diabetes mellitus    INSULIN AND ORAL MEDS  . Goiter 12/15   will have radiation in February  . History of pneumonia    associated w/  rhabdomyolysis  OCT 2012  . Hyperlipidemia   . Hypertension   . Non-ischemic cardiomyopathy ([place])    normal ef  . Normal nuclear stress test 2008  . OA (  osteoarthritis) of knee    HANDS    Tobacco History: Social History   Tobacco Use  Smoking Status Former Smoker  . Packs/day: 1.00  . Years: 20.00  . Pack years: 20.00  . Types: Cigarettes  . Last attempt to quit: 05/05/1996  . Years since quitting: 22.5  Smokeless Tobacco Never Used   Counseling given: Not Answered   Outpatient Medications Prior to Visit  Medication Sig Dispense Refill  . b complex vitamins capsule  Take 1 capsule by mouth daily.     . calcium carbonate (OS-CAL) 600 MG TABS Take 600 mg by mouth 2 (two) times daily.     . cholecalciferol (VITAMIN D) 1000 UNITS tablet Take 1,000 Units by mouth daily.     [name] ELIQUIS 5 MG TABS tablet TAKE 1 TABLET BY MOUTH TWICE A DAY 180 tablet 1  . fluticasone (FLONASE) 50 MCG/ACT nasal spray Place 2 sprays into both nostrils daily. 16 g 0  . furosemide (LASIX) 40 MG tablet TAKE 1 TABLET (40 MG TOTAL) BY MOUTH DAILY. 90 tablet 3  . glucose blood test strip as directed.    [name] guaiFENesin (MUCINEX) 600 MG 12 hr tablet Take 1 tablet (600 mg total) by mouth 2 (two) times daily as needed. 14 tablet 0  . hydrochlorothiazide (HYDRODIURIL) 25 MG tablet     . Insulin NPH Isophane & Regular (NOVOLIN 70/30 FLEXPEN [place]) Inject into the skin. Inject 30 units in AM and 28 units QHS    . Insulin Pen Needle (PEN NEEDLES) 31G X 6 MM MISC 1 Units by Does not apply route daily. 100 each 3  . levothyroxine (SYNTHROID, LEVOTHROID) 137 MCG tablet Take 1 tablet (137 mcg total) by mouth daily before breakfast. 90 tablet 3  . losartan (COZAAR) 100 MG tablet Please specify directions, refills and quantity 1 tablet 0  . metFORMIN (GLUCOPHAGE-XR) 500 MG 24 hr tablet Take 2,000 mg by mouth at bedtime.    . miconazole (MICOTIN) 2 % powder Apply topically as needed for itching. 70 g 0  . Multiple Vitamin (MULTIVITAMIN) tablet Take 1 tablet by mouth daily.     . Omega-3 Fatty Acids (FISH OIL PO) Take 1 tablet by mouth daily.     . potassium chloride SA (KLOR-CON M20) 20 MEQ tablet Take 1 tablet (20 mEq total) by mouth daily. Please schedule an appointment with our office to continuation of medication refills 90 tablet 3  . PROAIR HFA 108 (90 Base) MCG/ACT inhaler INHALE 2 PUFFS EVERY 4 HOURS AS NEEDED. 8.5 Inhaler 4  . rosuvastatin (CRESTOR) 5 MG tablet TAKE 1 TABLET BY MOUTH EVERY DAY 30 tablet 10  . triamcinolone cream (KENALOG) 0.1 % Apply 1 application topically 2 (two) times daily as  needed. 80 g 1  . Glycopyrrolate-Formoterol (BEVESPI AEROSPHERE) 9-4.8 MCG/ACT AERO Inhale 2 puffs into the lungs 2 (two) times daily. (Patient not taking: Reported on 11/13/2018) 10.7 g 1  . Insulin Isophane & Regular Human (NOVOLIN 70/30 FLEXPEN) (70-30) 100 UNIT/ML PEN Inject 28 Units into the skin every morning AND 24 Units daily after supper. 15 mL 11  . predniSONE (DELTASONE) 5 MG tablet Take 5 mg by mouth daily with breakfast. For 5 days.     No facility-administered medications prior to visit.     Review of Systems  Review of Systems  Constitutional: Negative.   HENT: Negative.   Respiratory: Positive for shortness of breath and wheezing.   Cardiovascular: Negative.     Physical Exam  BP (!) 142/78   Pulse 89   Temp 97.7 F (36.5 C) (Oral)   Ht 5\' 3"  (1.6 m)   Wt 201 lb 9.6 oz (91.4 kg)   SpO2 99%   BMI 35.71 kg/m  Physical Exam Constitutional:      Appearance: Normal appearance.  HENT:     Head: Normocephalic and atraumatic.     Right Ear: Tympanic membrane normal.     Left Ear: Tympanic membrane normal.     Mouth/Throat:     Mouth: Mucous membranes are moist.     Pharynx: Oropharynx is clear.  Eyes:     Extraocular Movements: Extraocular movements intact.     Pupils: Pupils are equal, round, and reactive to light.  Neck:     Musculoskeletal: Normal range of motion and neck supple.  Cardiovascular:     Rate and Rhythm: Normal rate and regular rhythm.  Pulmonary:     Effort: Pulmonary effort is normal. No respiratory distress.     Breath sounds: Normal breath sounds. No wheezing.  Musculoskeletal: Normal range of motion.  Skin:    General: Skin is warm and dry.  Neurological:     General: No focal deficit present.     Mental Status: She is alert and oriented to person, place, and time. Mental status is at baseline.  Psychiatric:        Mood and Affect: Mood normal.        Behavior: Behavior normal.        Thought Content: Thought content normal.         Judgment: Judgment normal.      Lab Results:  CBC    Component Value Date/Time   WBC 7.8 10/09/2018 1354   RBC 4.49 10/09/2018 1354   HGB 12.7 10/09/2018 1354   HCT 38.9 10/09/2018 1354   PLT 310.0 10/09/2018 1354   MCV 86.5 10/09/2018 1354   MCV 71.8 (A) 08/13/2014 1538   MCH 29.7 12/08/2016 1229   MCHC 32.6 10/09/2018 1354   RDW 14.7 10/09/2018 1354   LYMPHSABS 0.9 10/09/2018 1354   MONOABS 0.5 10/09/2018 1354   EOSABS 0.0 10/09/2018 1354   BASOSABS 0.0 10/09/2018 1354    BMET    Component Value Date/Time   NA 142 10/09/2018 1354   NA 145 03/29/2018   K 3.7 10/09/2018 1354   CL 102 10/09/2018 1354   CO2 30 10/09/2018 1354   GLUCOSE 164 (H) 10/09/2018 1354   BUN 24 (H) 10/09/2018 1354   BUN 24 (A) 03/29/2018   CREATININE 0.79 10/09/2018 1354   CREATININE 0.82 12/02/2015 1056   CALCIUM 9.7 10/09/2018 1354   GFRNONAA 23 (L) 08/15/2011 0620   GFRAA 27 (L) 08/15/2011 0620    BNP No results found for: BNP  ProBNP    Component Value Date/Time   PROBNP 4619.0 (H) 08/09/2011 1800    Imaging: No results found.   Assessment & Plan:   COPD mixed type ([place]) - Moderate obstruction on spirometry  - No noticeable benefit from bevespi - Trial Trelegy 1 puff daily (sample given)- instructed to call office if helpful and will send in [place] as needed for sob/wheezing  - FU in 3 months with Dr. Kaleen Mask, NP 11/13/2018

## 2018-11-13 NOTE — Patient Instructions (Addendum)
Trial trelegy - take 1 puff daily  Continue Proair (Albuterol) rescue inhaler- take 2 puffs every 4 hours as needed for wheezing/shortness of breath   Please call us and let us know how new inhaler is working for you, if helping will send in RX. If not we can try a different combination. If too expensive let us know, we can try applying for patient assistance   Follow up in 3 months with Dr. Annamaria Boots

## 2018-11-13 NOTE — Progress Notes (Signed)
Patient seen in the office today and instructed on use of Trelegy.  Patient expressed understanding and demonstrated technique.  

## 2018-11-22 ENCOUNTER — Encounter: Payer: Self-pay | Admitting: Internal Medicine

## 2018-11-25 ENCOUNTER — Other Ambulatory Visit: Payer: Self-pay | Admitting: Cardiovascular Disease

## 2018-11-26 NOTE — Telephone Encounter (Signed)
Error

## 2018-11-28 ENCOUNTER — Telehealth: Payer: Self-pay | Admitting: Primary Care

## 2018-11-28 MED ORDER — FLUTICASONE-UMECLIDIN-VILANT 100-62.5-25 MCG/INH IN AEPB: 1.0000 | INHALATION_SPRAY | Freq: Every day | RESPIRATORY_TRACT | 3 refills | Status: DC

## 2018-11-28 NOTE — Telephone Encounter (Signed)
Spoke with pt and advised rx for Trelegy was sent to [facility]. Nothing further is needed.    Patient Instructions by Martyn Ehrich, NP at 11/13/2018 10:00 AM  Author: Martyn Ehrich, NP Author Type: Nurse Practitioner Filed: 11/13/2018 10:32 AM  Note Status: Addendum Cosign: Cosign Not Required Encounter Date: 11/13/2018  Editor: Martyn Ehrich, NP (Nurse Practitioner)  Prior Versions: 1. Martyn Ehrich, NP (Nurse Practitioner) at 11/13/2018 10:28 AM - Signed    Trial trelegy - take 1 puff daily  Continue Proair (Albuterol) rescue inhaler- take 2 puffs every 4 hours as needed for wheezing/shortness of breath   Please call us and let us know how new inhaler is working for you, if helping will send in RX. If not we can try a different combination. If too expensive let us know, we can try applying for patient assistance   Follow up in 3 months with Dr. Annamaria Boots     Instructions      Return in about 3 months (around 02/12/2019).  Trial trelegy - take 1 puff daily  Continue Proair (Albuterol) rescue inhaler- take 2 puffs every 4 hours as needed for wheezing/shortness of breath   Please call us and let us know how new inhaler is working for you, if helping will send in RX. If not we can try a different combination. If too expensive let us know, we can try applying for patient assistance   Follow up in 3 months with Dr. Annamaria Boots

## 2018-11-30 ENCOUNTER — Other Ambulatory Visit: Payer: Self-pay | Admitting: Cardiovascular Disease

## 2019-01-19 ENCOUNTER — Encounter: Payer: Self-pay | Admitting: Nurse Practitioner

## 2019-01-22 ENCOUNTER — Other Ambulatory Visit: Payer: Self-pay | Admitting: Cardiovascular Disease

## 2019-02-12 ENCOUNTER — Ambulatory Visit: Payer: Medicare Other | Admitting: Internal Medicine

## 2019-02-19 ENCOUNTER — Other Ambulatory Visit: Payer: Self-pay | Admitting: Cardiovascular Disease

## 2019-02-19 NOTE — Telephone Encounter (Signed)
Losartan and HCTZ refilled. 

## 2019-02-25 ENCOUNTER — Other Ambulatory Visit: Payer: Self-pay | Admitting: Cardiovascular Disease

## 2019-02-25 NOTE — Telephone Encounter (Signed)
Rosuvastatin 5 mg refilled. 

## 2019-03-25 ENCOUNTER — Other Ambulatory Visit: Payer: Self-pay

## 2019-03-25 ENCOUNTER — Encounter (HOSPITAL_COMMUNITY): Payer: Self-pay | Admitting: Emergency Medicine

## 2019-03-25 ENCOUNTER — Emergency Department (HOSPITAL_COMMUNITY)
Admission: EM | Admit: 2019-03-25 | Discharge: 2019-03-25 | Disposition: A | Discharge status: Home / Self Care | Payer: Medicare Other | Attending: Emergency Medicine | Admitting: Emergency Medicine

## 2019-03-25 DIAGNOSIS — N390 Urinary tract infection, site not specified: Secondary | ICD-10-CM | POA: Diagnosis not present

## 2019-03-25 DIAGNOSIS — Z7901 Long term (current) use of anticoagulants: Secondary | ICD-10-CM | POA: Diagnosis not present

## 2019-03-25 DIAGNOSIS — J449 Chronic obstructive pulmonary disease, unspecified: Secondary | ICD-10-CM | POA: Insufficient documentation

## 2019-03-25 DIAGNOSIS — Z87891 Personal history of nicotine dependence: Secondary | ICD-10-CM | POA: Insufficient documentation

## 2019-03-25 DIAGNOSIS — Z794 Long term (current) use of insulin: Secondary | ICD-10-CM | POA: Diagnosis not present

## 2019-03-25 DIAGNOSIS — Z79899 Other long term (current) drug therapy: Secondary | ICD-10-CM | POA: Diagnosis not present

## 2019-03-25 DIAGNOSIS — E119 Type 2 diabetes mellitus without complications: Secondary | ICD-10-CM | POA: Insufficient documentation

## 2019-03-25 DIAGNOSIS — R42 Dizziness and giddiness: Secondary | ICD-10-CM | POA: Diagnosis present

## 2019-03-25 DIAGNOSIS — E039 Hypothyroidism, unspecified: Secondary | ICD-10-CM | POA: Diagnosis not present

## 2019-03-25 DIAGNOSIS — I10 Essential (primary) hypertension: Secondary | ICD-10-CM | POA: Insufficient documentation

## 2019-03-25 LAB — URINALYSIS, ROUTINE W REFLEX MICROSCOPIC
Bilirubin Urine: NEGATIVE
Glucose, UA: NEGATIVE mg/dL
Ketones, ur: NEGATIVE mg/dL
Nitrite: POSITIVE — AB
Protein, ur: 30 mg/dL — AB
Specific Gravity, Urine: 1.01 (ref 1.005–1.030)
WBC, UA: >50 WBC/hpf — ABNORMAL HIGH (ref 0–5)
pH: 6 (ref 5.0–8.0)

## 2019-03-25 LAB — CBG MONITORING, ED
Glucose-Capillary: 91 mg/dL (ref 70–99)
Glucose-Capillary: 103 mg/dL — ABNORMAL HIGH (ref 70–99)

## 2019-03-25 LAB — CBC
HCT: 34.8 % — ABNORMAL LOW (ref 36.0–46.0)
Hemoglobin: 11.1 g/dL — ABNORMAL LOW (ref 12.0–15.0)
MCH: 27.4 pg (ref 26.0–34.0)
MCHC: 31.9 g/dL (ref 30.0–36.0)
MCV: 85.9 fL (ref 80.0–100.0)
Platelets: 294 10*3/uL (ref 150–400)
RBC: 4.05 MIL/uL (ref 3.87–5.11)
RDW: 14.5 % (ref 11.5–15.5)
WBC: 12 10*3/uL — ABNORMAL HIGH (ref 4.0–10.5)
nRBC: 0 % (ref 0.0–0.2)

## 2019-03-25 LAB — BASIC METABOLIC PANEL
Anion gap: 12 (ref 5–15)
BUN: 24 mg/dL — ABNORMAL HIGH (ref 8–23)
CO2: 23 mmol/L (ref 22–32)
Calcium: 9 mg/dL (ref 8.9–10.3)
Chloride: 100 mmol/L (ref 98–111)
Creatinine, Ser: 1.05 mg/dL — ABNORMAL HIGH (ref 0.44–1.00)
GFR calc Af Amer: 59 mL/min — ABNORMAL LOW (ref >60)
GFR calc non Af Amer: 51 mL/min — ABNORMAL LOW (ref >60)
Glucose, Bld: 109 mg/dL — ABNORMAL HIGH (ref 70–99)
Potassium: 3.3 mmol/L — ABNORMAL LOW (ref 3.5–5.1)
Sodium: 135 mmol/L (ref 135–145)

## 2019-03-25 MED ORDER — CEFTRIAXONE SODIUM 1 G IJ SOLR
1.0000 g | Freq: Once | INTRAMUSCULAR | Status: AC
  Administered 2019-03-25: 04:00:00 1 g via INTRAMUSCULAR
  Filled 2019-03-25: qty 10

## 2019-03-25 MED ORDER — SODIUM CHLORIDE 0.9% FLUSH
3.0000 mL | Freq: Once | INTRAVENOUS | Status: AC
  Administered 2019-03-25: 02:00:00 3 mL via INTRAVENOUS

## 2019-03-25 MED ORDER — LIDOCAINE HCL (PF) 1 % IJ SOLN
INTRAMUSCULAR | Status: AC
  Administered 2019-03-25: 2.1 mL
  Filled 2019-03-25: qty 5

## 2019-03-25 MED ORDER — CEPHALEXIN 500 MG PO CAPS: 500.0000 mg | ORAL_CAPSULE | Freq: Two times a day (BID) | ORAL | 0 refills | Status: DC

## 2019-03-25 NOTE — ED Provider Notes (Signed)
[facility] EMERGENCY DEPARTMENT Provider Note   CSN: [id number] Arrival date & time: 03/25/19  0032    History   Chief Complaint Chief Complaint  Patient presents with   Fatigue   Fever    HPI Renee Ray is a 78 y.o. female.     The history is provided by the patient and medical records.     78 year old female with history of A. fib on Eliquis, renal insufficiency, COPD, diabetes, hyperlipidemia, hypertension, hypothyroidism, presenting to the ED after episode of feeling a little "off".  States she got off work around [employer], she works at [employer].  States she did notice she was a little chilly at work but put on her jacket and seemed to improve.  When she left she reports she felt a little "unsteady" on her feet.  She drove home as normal and again felt a little "unsteady" on her feet upon getting out of the car.  States it felt like as if her blood sugar had dropped.  She was not dizzy or confused during this episode.  She made it to her room where her glucometer was was about to check her sugar but legs "gave out from under her".  She fell onto the carpeted floor but denies any head injury or loss of consciousness.  She remained awake and was able to call her daughter-in-law who came to the house to check on her.  They checked her sugar and it was 220.  She did take a glucose tablet and afterwards check sugar again and it was 150.  EMS did come to the house and evaluated her with a negative stroke screen but recommended she get evaluated anyway.  She was transported here by private vehicle.  States now she feels fine.  She was able to stand in room and walk over to bed without issue.  She denies any headache, chest pain, shortness of breath, focal numbness or weakness, blurred vision, confusion, difficulty speaking, or other abnormalities earlier.  She states overall she has been well, earlier in the week she was having some dysuria but took some  over-the-counter AZO which seemed to help with symptoms but has not taken in the past few days.  She has not had any cough, fever, sore throat, rhinorrhea, or other upper respiratory symptoms.  No abdominal pain, nausea, vomiting, or diarrhea.  Past Medical History:  Diagnosis Date   Abnormal echocardiogram 2010   showing slightly enlarged left atrium with normal LV function and normal PA pressures.    Acute medial meniscal injury of knee    RIGHT KNEE   Asthmatic bronchitis , chronic ([facility])    Atrial fibrillation ([facility])    Bruise    RIGHT ABD. DUE TO LOVENOX INJECTION  02-27-2012   Chronic anticoagulation    coumadin   Chronic atrial fibrillation    CARDIOLOGIST- DR BRACKBILL-- LAST VISIT  12-16-2011 IN [facility]   Chronic renal insufficiency    COPD, moderate ([facility])    PULMOLOGIST- DR YOUNG --  LAST VISIT NOTE 02-23-2012 IN [facility]   Diabetes mellitus    INSULIN AND ORAL MEDS   Goiter 12/15   will have radiation in February   History of pneumonia    associated w/  rhabdomyolysis  OCT 2012   Hyperlipidemia    Hypertension    Non-ischemic cardiomyopathy ([place])    normal ef   Normal nuclear stress test 2008   OA (osteoarthritis) of knee    HANDS  Patient Active Problem List   Diagnosis Date Noted   Essential hypertension 05/04/2018   Hypothyroidism 10/25/2016   Vitamin D deficiency 10/21/2016   Mass of upper outer quadrant of left breast 10/21/2016   Postmenopausal state 10/21/2016   Osteoarthritis of both hands 08/30/2015   Abnormal SPEP 08/30/2015   Anemia, iron deficiency 10/16/2014   Long term current use of anticoagulant 10/16/2014   Iron deficiency anemia 10/09/2014   Encounter for therapeutic drug monitoring 01/06/2014   Upper respiratory infection 09/02/2013   Heel spur 09/06/2012   Acute medial meniscal tear 03/02/2012   Hypercholesterolemia 08/24/2011   Benign hypertensive heart disease without heart failure 05/16/2011   COPD mixed  type ([place]) 09/03/2009   Diabetes mellitus type 2 in obese ([place]) 09/04/2008   CARDIOMYOPATHY 09/04/2008   Longstanding persistent atrial fibrillation 09/04/2008   ASTHMATIC BRONCHITIS, ACUTE 09/04/2008    Past Surgical History:  Procedure Laterality Date   [place]--- BREAST IMPLANTS REMOVED    KNEE ARTHROSCOPY  03/02/2012   Procedure: ARTHROSCOPY KNEE;  Surgeon: Gearlean Alf, MD;  Location: [facility];  Service: Orthopedics;  Laterality: Right;  WITH DEBRIDEMENT of medial menisces   VAGINAL HYSTERECTOMY  1978     OB History   No obstetric history on file.      Home Medications    Prior to Admission medications   Medication Sig Start Date End Date Taking? Authorizing Provider  b complex vitamins capsule Take 1 capsule by mouth daily.     [provider]  calcium carbonate (OS-CAL) 600 MG TABS Take 600 mg by mouth 2 (two) times daily.     [provider]  cholecalciferol (VITAMIN D) 1000 UNITS tablet Take 1,000 Units by mouth daily.     [provider]  ELIQUIS 5 MG TABS tablet TAKE 1 TABLET BY MOUTH TWICE A DAY 10/01/18   Croitoru, Mihai, MD  fluticasone (FLONASE) 50 MCG/ACT nasal spray Place 2 sprays into both nostrils daily. 10/21/16   Nche, Charlene Brooke, NP  Fluticasone-Umeclidin-Vilant (TRELEGY ELLIPTA) 100-62.5-25 MCG/INH AEPB Inhale 1 puff into the lungs daily. 11/28/18   Martyn Ehrich, NP  furosemide (LASIX) 40 MG tablet TAKE 1 TABLET (40 MG TOTAL) BY MOUTH DAILY. 09/18/18   Croitoru, Mihai, MD  glucose blood test strip as directed. 02/03/12   [provider]  Glycopyrrolate-Formoterol (BEVESPI AEROSPHERE) 9-4.8 MCG/ACT AERO Inhale 2 puffs into the lungs 2 (two) times daily. Patient not taking: Reported on 11/13/2018 10/03/18   Martyn Ehrich, NP  guaiFENesin (MUCINEX) 600 MG 12 hr tablet Take 1 tablet (600 mg total) by mouth 2 (two) times daily as needed. 10/21/16   Nche,  Charlene Brooke, NP  hydrochlorothiazide (HYDRODIURIL) 25 MG tablet TAKE 1 TABLET BY MOUTH EVERY DAY WITH LOSARTAN. 02/19/19   Croitoru, Mihai, MD  Insulin NPH Isophane & Regular (NOVOLIN 70/30 FLEXPEN [place]) Inject into the skin. Inject 30 units in AM and 28 units QHS    [provider]  Insulin Pen Needle (PEN NEEDLES) 31G X 6 MM MISC 1 Units by Does not apply route daily. 04/21/17   Nche, Charlene Brooke, NP  KLOR-CON M20 20 MEQ tablet TAKE 1 TABLET BY MOUTH DAILY *PLEASE SCHEDULE APPOINTMENT WITH OFFICE FOR REFILLS* 01/22/19   Croitoru, Dani Gobble, MD  levothyroxine (SYNTHROID, LEVOTHROID) 137 MCG tablet Take 1 tablet (137 mcg total) by mouth daily before breakfast. 07/13/18   Nche, Charlene Brooke, NP  losartan (COZAAR) 100 MG tablet  TAKE 1 TABLET BY MOUTH EVERY DAY WITH HYDROCHLOROTHIAZIDE. 02/19/19   Croitoru, Mihai, MD  metFORMIN (GLUCOPHAGE-XR) 500 MG 24 hr tablet Take 2,000 mg by mouth at bedtime. 09/11/18   [provider]  miconazole (MICOTIN) 2 % powder Apply topically as needed for itching. 04/21/17   Nche, Charlene Brooke, NP  Multiple Vitamin (MULTIVITAMIN) tablet Take 1 tablet by mouth daily.     [provider]  Omega-3 Fatty Acids (FISH OIL PO) Take 1 tablet by mouth daily.     [provider]  PROAIR HFA 108 (90 Base) MCG/ACT inhaler INHALE 2 PUFFS EVERY 4 HOURS AS NEEDED. 10/28/16   Baird Lyons D, MD  rosuvastatin (CRESTOR) 5 MG tablet TAKE 1 TABLET BY MOUTH EVERY DAY 02/25/19   Croitoru, Mihai, MD  triamcinolone cream (KENALOG) 0.1 % Apply 1 application topically 2 (two) times daily as needed. 10/09/18   Nche, Charlene Brooke, NP    Family History Family History  Problem Relation Age of Onset   Arrhythmia Mother    Heart failure Mother    Stroke Mother    Heart attack Father    Diabetes Brother    Heart disease Brother     Social History Social History   Tobacco Use   Smoking status: Former Smoker    Packs/day: 1.00    Years: 20.00    Pack  years: 20.00    Types: Cigarettes    Last attempt to quit: 05/05/1996    Years since quitting: 22.9   Smokeless tobacco: Never Used  Substance Use Topics   Alcohol use: No    Alcohol/week: 0.0 standard drinks   Drug use: No     Allergies   Advil [ibuprofen]; Janumet [sitagliptin-metformin hcl]; Amoxicillin; and Sulfonamide derivatives   Review of Systems Review of Systems  Genitourinary: Positive for dysuria.  Neurological: Positive for light-headedness.  All other systems reviewed and are negative.    Physical Exam Updated Vital Signs BP 125/67 (BP Location: Left Arm)    Pulse 98    Temp 99.5 F (37.5 C) (Oral)    Resp 17    Ht 5\' 3"  (1.6 m)    Wt 88.9 kg    SpO2 97%    BMI 34.72 kg/m   Physical Exam Vitals signs and nursing note reviewed.  Constitutional:      Appearance: She is well-developed.  HENT:     Head: Normocephalic and atraumatic.  Eyes:     Conjunctiva/sclera: Conjunctivae normal.     Pupils: Pupils are equal, round, and reactive to light.     Comments: PERRL  Neck:     Musculoskeletal: Normal range of motion.  Cardiovascular:     Rate and Rhythm: Normal rate and regular rhythm.     Heart sounds: Normal heart sounds.  Pulmonary:     Effort: Pulmonary effort is normal.     Breath sounds: Normal breath sounds.  Abdominal:     General: Bowel sounds are normal.     Palpations: Abdomen is soft.     Comments: Soft, non-tender  Musculoskeletal: Normal range of motion.  Skin:    General: Skin is warm and dry.  Neurological:     Mental Status: She is alert and oriented to person, place, and time.     Comments: AAOx3, answering questions and following commands appropriately; speech is clear and goal oriented; equal strength UE and LE bilaterally, no observed ataxia; CN grossly intact; moves all extremities appropriately without ataxia; no focal neuro deficits or  facial asymmetry appreciated      ED Treatments / Results  Labs (all labs ordered are  listed, but only abnormal results are displayed) Labs Reviewed  BASIC METABOLIC PANEL - Abnormal; Notable for the following components:      Result Value   Potassium 3.3 (*)    Glucose, Bld 109 (*)    BUN 24 (*)    Creatinine, Ser 1.05 (*)    GFR calc non Af Amer 51 (*)    GFR calc Af Amer 59 (*)    All other components within normal limits  CBC - Abnormal; Notable for the following components:   WBC 12.0 (*)    Hemoglobin 11.1 (*)    HCT 34.8 (*)    All other components within normal limits  URINALYSIS, ROUTINE W REFLEX MICROSCOPIC - Abnormal; Notable for the following components:   APPearance CLOUDY (*)    Hgb urine dipstick MODERATE (*)    Protein, ur 30 (*)    Nitrite POSITIVE (*)    Leukocytes,Ua LARGE (*)    WBC, UA >50 (*)    Bacteria, UA RARE (*)    All other components within normal limits  CBG MONITORING, ED - Abnormal; Notable for the following components:   Glucose-Capillary 103 (*)    All other components within normal limits  URINE CULTURE    EKG None  Radiology No results found.  Procedures Procedures (including critical care time)  Medications Ordered in ED Medications  sodium chloride flush (NS) 0.9 % injection 3 mL (3 mLs Intravenous Given 03/25/19 0155)  cefTRIAXone (ROCEPHIN) injection 1 g (1 g Intramuscular Given 03/25/19 0429)  lidocaine (PF) (XYLOCAINE) 1 % injection (2.1 mLs  Given 03/25/19 0430)     Initial Impression / Assessment and Plan / ED Course  I have reviewed the triage vital signs and the nursing notes.  Pertinent labs & imaging results that were available during my care of the patient were reviewed by me and considered in my medical decision making (see chart for details).  78 y.o. F here after brief episodes of lightheadedness after leaving work and when returning home.  No syncope.  States it felt like her blood sugar was low.  CBG checked and was 220, ate glucose tablet, checked again and around 150.  Daughter in law came to  check on her along with EMS with negative stroke screen but recommended evaluation anyhow.  Patient reports now she feels fine.  She has not had any headaches, dizziness, confusion, focal numbness/weakness, changes in speech, facial droop.  VSS.  Neurologic exam is non-focal here.  EKG is nonischemic.  Will obtain screening labs as well as urinalysis given her reported dysuria.  Will monitor closely.  Patient remains hemodynamically stable and largely asymptomatic here.  Labs are overall reassuring, white blood cell count is 12,000.  UA does appear infectious.  Clinically she does not have any signs or symptoms suggestive of sepsis--i.e. no tachycardia, fever, or hypotension.  She was given dose of IV Rocephin here will be discharged home with Keflex pending urine culture.  Encourage good oral hydration.  Close follow-up with PCP.  Return here for any new or acute changes.  Final Clinical Impressions(s) / ED Diagnoses   Final diagnoses:  Lower urinary tract infectious disease    ED Discharge Orders         Ordered    cephALEXin (KEFLEX) 500 MG capsule  2 times daily     03/25/19 0503  Larene Pickett, PA-C 03/25/19 0546    Fatima Blank, MD 03/26/19 [phone]

## 2019-03-25 NOTE — ED Notes (Signed)
RN asked pt if she wanted family to be updated. Pt declined.

## 2019-03-25 NOTE — ED Notes (Signed)
Checked CBG 103

## 2019-03-25 NOTE — ED Triage Notes (Addendum)
States she went to work from [employer].  When she got to work she felt cold and shivering for about 30 min but felt better after putting her jacket on.  Went home and felt unsteady on her feet and fatigue when she got out of car and walked inside.  Took glucose tablet because she was unsure if blood sugar was low and then felt a little better.  Reports CBG 150s at home after glucose tab.  Now CBG 91.   No arm drift.  Took temp tonight at home and it was 101.  Reports burning with urination earlier in the week, took AZO with relief of symptoms.

## 2019-03-25 NOTE — Discharge Instructions (Signed)
Take the prescribed medication as directed.  Make sure to drink plenty of water to stay hydrated. Follow-up with your primary care doctor. Return to the ED for new or worsening symptoms.

## 2019-03-26 ENCOUNTER — Ambulatory Visit (INDEPENDENT_AMBULATORY_CARE_PROVIDER_SITE_OTHER): Payer: Medicare Other | Admitting: Nurse Practitioner

## 2019-03-26 ENCOUNTER — Encounter: Payer: Self-pay | Admitting: Nurse Practitioner

## 2019-03-26 ENCOUNTER — Other Ambulatory Visit: Payer: Self-pay | Admitting: Cardiovascular Disease

## 2019-03-26 VITALS — Ht 63.0 in | Wt 196.0 lb

## 2019-03-26 DIAGNOSIS — N3001 Acute cystitis with hematuria: Secondary | ICD-10-CM | POA: Diagnosis not present

## 2019-03-26 DIAGNOSIS — E669 Obesity, unspecified: Secondary | ICD-10-CM | POA: Diagnosis not present

## 2019-03-26 DIAGNOSIS — E1169 Type 2 diabetes mellitus with other specified complication: Secondary | ICD-10-CM | POA: Diagnosis not present

## 2019-03-26 DIAGNOSIS — E876 Hypokalemia: Secondary | ICD-10-CM | POA: Diagnosis not present

## 2019-03-26 LAB — URINE CULTURE: Culture: >=100000 — AB

## 2019-03-26 MED ORDER — CIPROFLOXACIN HCL 250 MG PO TABS: 250.0000 mg | ORAL_TABLET | Freq: Two times a day (BID) | ORAL | 0 refills | Status: DC

## 2019-03-26 NOTE — Assessment & Plan Note (Signed)
Managed by Dr. Buddy Duty with [facility], last OV 10/02/2018,  normal foot exam per patient. Last hgbA1c 7.7 per patient Today glucose of 145 (fasting). No hypoglycemia, no paresthesia Last eye exam in 08/2018: normal diabetic retinopathy per patient, mild bilateral cataracts. No glaucoma per patient. Dental cleaning every 68months  Fasting Glucose: 158 Noticed an increase in glucose in last 1week with UTI symptoms. Current use of novolin 24-30units and metformin.

## 2019-03-26 NOTE — Progress Notes (Signed)
Interactive audio and video telecommunications were attempted between this provider and patient, however failed, due to patient having technical difficulties OR patient did not have access to video capability.  We continued and completed visit with audio only.   Virtual Visit via Video Note  I connected with Renee Ray on 03/26/19 at  1:30 PM EDT by a video enabled telemedicine application and verified that I am speaking with the correct person using two identifiers.  Location: Patient: home Provider: Office   I discussed the limitations of evaluation and management by telemedicine and the availability of in person appointments. The patient expressed understanding and agreed to proceed.  CC: Hospital follow up--she is doing better but tired--pt mention back pain---tramadol consult?  History of Present Illness: UTI: Treated for UTI with rocephin IV and keflex by ED provider yesterday Urine culture positive for E.coli Diarrhea 2weeks ago, now resolved, uti symptoms start 1week ago, no improvement with AZO OTC. No fever, no hematuria Reports persistent Chills and back pain (mid back)  DM: Fasting Glucose: 158 Noticed an increase in glucose in last 1week with UTI symptoms. Current use of novolin 24-30units and metformin   Review of Systems  Constitutional: Positive for diaphoresis and malaise/fatigue.  Respiratory: Negative for cough and shortness of breath.   Cardiovascular: Negative for chest pain, palpitations, orthopnea and leg swelling.  Gastrointestinal: Positive for abdominal pain. Negative for blood in stool, constipation, diarrhea, nausea and vomiting.  Genitourinary: Positive for flank pain, frequency and urgency. Negative for hematuria.  Musculoskeletal: Positive for back pain and myalgias. Negative for falls.  Neurological: Negative for headaches.   Observations/Objective: Unable to provide vital signs. Reviewed vital signs from ED visit yesterday. Alert and  oriented x 4, normal speech and voice tone  Assessment and Plan: Dawn was seen today for hospitalization follow-up.  Diagnoses and all orders for this visit:  Acute cystitis with hematuria -     ciprofloxacin (CIPRO) 250 MG tablet; Take 1 tablet (250 mg total) by mouth 2 (two) times daily.    Follow Up Instructions: Stop keflex and start cipro. Call office if no improvement with completion of oral abx Push oral hydration Increase potassium to 83mEq x 2days only.   I discussed the assessment and treatment plan with the patient. The patient was provided an opportunity to ask questions and all were answered. The patient agreed with the plan and demonstrated an understanding of the instructions.   The patient was advised to call back or seek an in-person evaluation if the symptoms worsen or if the condition fails to improve as anticipated.   I spent 75mins during the telephone call  Wilfred Lacy, NP

## 2019-03-27 ENCOUNTER — Telehealth: Payer: Self-pay | Admitting: *Deleted

## 2019-03-27 NOTE — Telephone Encounter (Signed)
Post ED Visit - Positive Culture Follow-up  Culture report reviewed by antimicrobial stewardship pharmacist: [place] Team []  Elenor Quinones, Pharm.D. []  Heide Guile, Pharm.D., BCPS AQ-ID []  Parks Neptune, Pharm.D., BCPS []  Alycia Rossetti, Pharm.D., BCPS []  [place], Pharm.D., BCPS, AAHIVP []  Legrand Como, Pharm.D., BCPS, AAHIVP [x]  Salome Arnt, PharmD, BCPS []  Johnnette Gourd, PharmD, BCPS []  Hughes Better, PharmD, BCPS []  Leeroy Cha, PharmD []  Laqueta Linden, PharmD, BCPS []  Albertina Parr, PharmD  [place] Team []  Leodis Sias, PharmD []  Lindell Spar, PharmD []  Royetta Asal, PharmD []  Graylin Shiver, Rph []  Rema Fendt) Glennon Mac, PharmD []  Arlyn Dunning, PharmD []  Netta Cedars, PharmD []  Dia Sitter, PharmD []  Leone Haven, PharmD []  Gretta Arab, PharmD []  Theodis Shove, PharmD []  Peggyann Juba, PharmD []  Reuel Boom, PharmD   Positive urine culture Treated with Cephalexin, organism sensitive to the same and no further patient follow-up is required at this time.  Harlon Flor [facility] 03/27/2019, 9:43 AM

## 2019-04-04 ENCOUNTER — Telehealth: Payer: Self-pay | Admitting: Nurse Practitioner

## 2019-04-04 NOTE — Telephone Encounter (Signed)
Pt called and was told by Baldo Ash to come by the office to give a UA sample after pt was done with medication. I looked in chart and I could not find an order for this. Please advise. Please call patient to schedule lab appt at [phone]

## 2019-04-04 NOTE — Telephone Encounter (Signed)
Pt is ware. She feels a lot better.

## 2019-04-04 NOTE — Telephone Encounter (Signed)
Repeat urinalysis was needed if her symptoms have not resolved

## 2019-04-04 NOTE — Telephone Encounter (Signed)
Charlotte please advise, do not see note in system or order in system.

## 2019-04-09 ENCOUNTER — Other Ambulatory Visit: Payer: Self-pay

## 2019-04-09 ENCOUNTER — Telehealth (INDEPENDENT_AMBULATORY_CARE_PROVIDER_SITE_OTHER): Payer: Medicare Other | Admitting: Nurse Practitioner

## 2019-04-09 ENCOUNTER — Encounter: Payer: Self-pay | Admitting: Nurse Practitioner

## 2019-04-09 VITALS — BP 134/78 | HR 91 | Temp 98.2°F | Ht 63.0 in | Wt 193.6 lb

## 2019-04-09 DIAGNOSIS — B372 Candidiasis of skin and nail: Secondary | ICD-10-CM

## 2019-04-09 DIAGNOSIS — E039 Hypothyroidism, unspecified: Secondary | ICD-10-CM | POA: Diagnosis not present

## 2019-04-09 DIAGNOSIS — Z780 Asymptomatic menopausal state: Secondary | ICD-10-CM

## 2019-04-09 DIAGNOSIS — E1169 Type 2 diabetes mellitus with other specified complication: Secondary | ICD-10-CM

## 2019-04-09 DIAGNOSIS — E782 Mixed hyperlipidemia: Secondary | ICD-10-CM

## 2019-04-09 DIAGNOSIS — I10 Essential (primary) hypertension: Secondary | ICD-10-CM | POA: Diagnosis not present

## 2019-04-09 DIAGNOSIS — M545 Low back pain, unspecified: Secondary | ICD-10-CM

## 2019-04-09 DIAGNOSIS — G8929 Other chronic pain: Secondary | ICD-10-CM

## 2019-04-09 DIAGNOSIS — E669 Obesity, unspecified: Secondary | ICD-10-CM

## 2019-04-09 DIAGNOSIS — E559 Vitamin D deficiency, unspecified: Secondary | ICD-10-CM

## 2019-04-09 MED ORDER — TRAMADOL HCL 50 MG PO TABS: 50.0000 mg | ORAL_TABLET | Freq: Two times a day (BID) | ORAL | 0 refills | Status: AC | PRN

## 2019-04-09 NOTE — Patient Instructions (Signed)
Will fax lab results to D. Buddy Duty once reviewed. Tramadol refill sent. Continue stretching and muscle strengthening exercise daily. You will be contacted to schedule appt for bone density.

## 2019-04-09 NOTE — Progress Notes (Signed)
For [employer] and video telecommunications were attempted between this provider and patient, however failed, due to patient having technical difficulties OR patient did not have access to video capability.  We continued and completed visit with audio only.   Virtual Visit via Video Note  I connected with Renee Ray on 04/10/19 at 10:15 AM EDT by a video enabled telemedicine application and verified that I am speaking with the correct person using two identifiers.  Location: Patient: Home Provider: Office   I discussed the limitations of evaluation and management by telemedicine and the availability of in person appointments. The patient expressed understanding and agreed to proceed.  CC: 6 mo f/u--report BS today 125/ refill for kenalog and tramadol--back pain PRN,vit B check--leg discomfort/ FYI--pt using [facility] for DM supply--med and [facility] for everything else.   History of Present Illness: Chronic back pain: Denies any new symptoms. Denies any fall Use of tramadol once a day as needed. Worse in AM, improves with stretching and walking. Has noticed change in posture. Improves with yoga and muscle strengthening exercises 3x/week. Also use acupunture in past with significant relief Plans resume accupuncture sessions.  HTN: No BP to share today. Does not check BP at home Based of last OV, BP is at goal with losartan. BP Readings from Last 3 Encounters:  04/10/19 134/78  03/25/19 133/69  11/13/18 (!) 142/78   DM and hypothyroidism: Managed by Dr. Buddy Duty. Next appt 04/11/2019.   Review of Systems  Constitutional: Negative.   Respiratory: Negative.   Cardiovascular: Negative.   Gastrointestinal: Negative.   Genitourinary: Negative.   Musculoskeletal: Positive for back pain and joint pain. Negative for falls, myalgias and neck pain.  Skin: Negative.   Neurological: Negative.  Negative for sensory change, focal weakness and weakness.  Psychiatric/Behavioral:  Negative.  Negative for depression, memory loss and suicidal ideas. The patient is not nervous/anxious and does not have insomnia.    Observations/Objective:   Assessment and Plan: Renee Ray was seen today for follow-up.  Diagnoses and all orders for this visit:  Essential hypertension -     CBC; Future  Diabetes mellitus type 2 in obese (HCC) -     Hemoglobin A1c; Future -     Hepatic function panel; Future  Hypothyroidism, unspecified type -     TSH; Future -     T4, free; Future -     Hepatic function panel; Future -     levothyroxine (SYNTHROID) 137 MCG tablet; Take 1 tablet (137 mcg total) by mouth daily before breakfast.  Chronic bilateral low back pain without sciatica -     traMADol (ULTRAM) 50 MG tablet; Take 1 tablet (50 mg total) by mouth every 12 (twelve) hours as needed for up to 7 days.  Mixed hyperlipidemia -     Lipid panel; Future -     Hepatic function panel; Future  Vitamin D deficiency -     Vitamin D 1,25 dihydroxy; Future  Asymptomatic age-related postmenopausal state -     DG Bone Density; Future  Candidiasis, intertrigo -     triamcinolone cream (KENALOG) 0.1 %; Apply 1 application topically 2 (two) times daily as needed.   Follow Up Instructions: Go to lab for blood draw. VS will be checked when comes in for blood draw. Will fax lab results to D. Buddy Duty once reviewed. Tramadol refill sent. Continue stretching and muscle strengthening exercise daily. You will be contacted to schedule appt for bone density. Need to schedule annual eye  exam.   I discussed the assessment and treatment plan with the patient. The patient was provided an opportunity to ask questions and all were answered. The patient agreed with the plan and demonstrated an understanding of the instructions.   The patient was advised to call back or seek an in-person evaluation if the symptoms worsen or if the condition fails to improve as anticipated.  I provided 15 minutes of  non-face-to-face time during this encounter.   Wilfred Lacy, NP

## 2019-04-10 ENCOUNTER — Ambulatory Visit: Payer: Medicare Other | Admitting: Nurse Practitioner

## 2019-04-10 ENCOUNTER — Other Ambulatory Visit: Payer: Medicare Other | Admitting: Nurse Practitioner

## 2019-04-10 ENCOUNTER — Other Ambulatory Visit (INDEPENDENT_AMBULATORY_CARE_PROVIDER_SITE_OTHER): Payer: Medicare Other

## 2019-04-10 ENCOUNTER — Encounter: Payer: Self-pay | Admitting: Nurse Practitioner

## 2019-04-10 DIAGNOSIS — E1169 Type 2 diabetes mellitus with other specified complication: Secondary | ICD-10-CM | POA: Diagnosis not present

## 2019-04-10 DIAGNOSIS — I10 Essential (primary) hypertension: Secondary | ICD-10-CM | POA: Diagnosis not present

## 2019-04-10 DIAGNOSIS — E559 Vitamin D deficiency, unspecified: Secondary | ICD-10-CM

## 2019-04-10 DIAGNOSIS — E669 Obesity, unspecified: Secondary | ICD-10-CM | POA: Diagnosis not present

## 2019-04-10 DIAGNOSIS — E119 Type 2 diabetes mellitus without complications: Secondary | ICD-10-CM

## 2019-04-10 DIAGNOSIS — E782 Mixed hyperlipidemia: Secondary | ICD-10-CM

## 2019-04-10 DIAGNOSIS — E039 Hypothyroidism, unspecified: Secondary | ICD-10-CM | POA: Diagnosis not present

## 2019-04-10 LAB — HEPATIC FUNCTION PANEL
ALT: 22 U/L (ref 0–35)
AST: 18 U/L (ref 0–37)
Albumin: 3.9 g/dL (ref 3.5–5.2)
Alkaline Phosphatase: 79 U/L (ref 39–117)
Bilirubin, Direct: 0.1 mg/dL (ref 0.0–0.3)
Total Bilirubin: 0.5 mg/dL (ref 0.2–1.2)
Total Protein: 6.6 g/dL (ref 6.0–8.3)

## 2019-04-10 LAB — LIPID PANEL
Cholesterol: 215 mg/dL — ABNORMAL HIGH (ref 0–200)
HDL: 62.5 mg/dL (ref >39.00)
LDL Cholesterol: 131 mg/dL — ABNORMAL HIGH (ref 0–99)
NonHDL: 152.83
Total CHOL/HDL Ratio: 3
Triglycerides: 108 mg/dL (ref 0.0–149.0)
VLDL: 21.6 mg/dL (ref 0.0–40.0)

## 2019-04-10 LAB — CBC
HCT: 37.6 % (ref 36.0–46.0)
Hemoglobin: 12.3 g/dL (ref 12.0–15.0)
MCHC: 32.6 g/dL (ref 30.0–36.0)
MCV: 85.5 fl (ref 78.0–100.0)
Platelets: 398 10*3/uL (ref 150.0–400.0)
RBC: 4.4 Mil/uL (ref 3.87–5.11)
RDW: 16 % — ABNORMAL HIGH (ref 11.5–15.5)
WBC: 6.5 10*3/uL (ref 4.0–10.5)

## 2019-04-10 LAB — T4, FREE: Free T4: 1.25 ng/dL (ref 0.60–1.60)

## 2019-04-10 LAB — HEMOGLOBIN A1C: Hgb A1c MFr Bld: 7.6 % — ABNORMAL HIGH (ref 4.6–6.5)

## 2019-04-10 LAB — TSH: TSH: 3.68 u[IU]/mL (ref 0.35–4.50)

## 2019-04-10 MED ORDER — LEVOTHYROXINE SODIUM 137 MCG PO TABS: 137.0000 ug | ORAL_TABLET | Freq: Every day | ORAL | 1 refills | Status: DC

## 2019-04-10 MED ORDER — TRIAMCINOLONE ACETONIDE 0.1 % EX CREA: 1.0000 "application " | TOPICAL_CREAM | Freq: Two times a day (BID) | CUTANEOUS | 1 refills | Status: DC | PRN

## 2019-04-12 ENCOUNTER — Encounter: Payer: Self-pay | Admitting: Nurse Practitioner

## 2019-04-13 LAB — VITAMIN D 1,25 DIHYDROXY
Vitamin D 1, 25 (OH)2 Total: 38 pg/mL (ref 18–72)
Vitamin D2 1, 25 (OH)2: <8 pg/mL
Vitamin D3 1, 25 (OH)2: 38 pg/mL

## 2019-04-15 ENCOUNTER — Other Ambulatory Visit: Payer: Self-pay | Admitting: Cardiovascular Disease

## 2019-05-01 ENCOUNTER — Ambulatory Visit: Payer: Medicare Other | Admitting: Internal Medicine

## 2019-05-01 ENCOUNTER — Other Ambulatory Visit: Payer: Self-pay

## 2019-05-01 ENCOUNTER — Encounter: Payer: Self-pay | Admitting: Internal Medicine

## 2019-05-01 DIAGNOSIS — M19042 Primary osteoarthritis, left hand: Secondary | ICD-10-CM | POA: Diagnosis not present

## 2019-05-01 DIAGNOSIS — J449 Chronic obstructive pulmonary disease, unspecified: Secondary | ICD-10-CM

## 2019-05-01 DIAGNOSIS — M19041 Primary osteoarthritis, right hand: Secondary | ICD-10-CM

## 2019-05-01 MED ORDER — TRELEGY ELLIPTA 100-62.5-25 MCG/INH IN AEPB: 1.0000 | INHALATION_SPRAY | Freq: Every day | RESPIRATORY_TRACT | 3 refills | Status: DC

## 2019-05-01 NOTE — Assessment & Plan Note (Signed)
Arthritis continues, but has not prevented use of her inhalers.

## 2019-05-01 NOTE — Progress Notes (Signed)
HPI female former smoker followed for asthmatic bronchitis complicated by DM, CM, atrial fibrillation.  PFT 10/05/2011-FVC 2.82/105%, FEV1 1.72/91%, ratio 0.6, FEF 30-79 year old 0.74/34%, TLC 5.09/111%, DLCO 74% History Advil triggered severe asthma requiring hospitalization. Office Spirometry 09/22/2016-moderate obstructive airways disease. FVC 2.13/80%, FEV1 1.23/62%, ratio 0.58, FEF 25-75 % 0.51/33%. Office Spirometry 04/04/2018-moderately severe obstruction.  FVC 1.96/75%, FEV1 1.05/54%, ratio 0.54, FEF 25-75% 0.43/29%.  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 Pt states she has had slight SOB with hot weather other wise doing well.  Office Spirometry 04/04/2018-moderately severe obstruction.  FVC 1.96/75%, FEV1 1.05/54%, ratio 0.54, FEF 25-75% 0.43/29%. Little routine cough.  Still working 3 days a week.  Uses rescue inhaler 0-2 times daily.  Pursed lip breathing helps.  Has some leftover prednisone she uses rarely, noting that it raises her blood sugar. Pending follow-up with cardiology but denies chest pain or palpitation. Denies acute events and did not have significant respiratory infection this winter.  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 -----pt states breathing at baseline, states Trelegy is working well for her, is requesting patient assistance for it. No exacerbations, infrequent need for rescue inhaler. Arthritis problems continue but she is still working part time at [employer]. CXR 04/04/18-  IMPRESSION: COPD.  Mild  cardiomegaly without pulmonary edema. Thoracic aortic atherosclerosis.  ROS-see HPI + = positive Constitutional:   No-   weight loss, night sweats, +fevers, chills, fatigue, lassitude. HEENT:   No-  headaches, difficulty swallowing, tooth/dental problems, sore throat,       No-  sneezing, itching, ear ache, +nasal congestion, post nasal drip,  CV:  No-   chest pain, orthopnea, PND, swelling in lower extremities, anasarca, dizziness, palpitations Resp:  +Some shortness of breath with exertion or at rest.              No-productive cough,  No non-productive cough,  No- coughing up of blood.              No-   change in color of mucus.  + wheezing.   Skin: No-   rash or lesions. GI:  No-   heartburn, indigestion, abdominal pain, nausea, vomiting,  GU: MS:  +  joint pain or swelling.   Neuro-     nothing unusual Psych:  No- change in mood or affect. No depression or anxiety.  No memory loss.  OBJ General- Alert, Oriented, Affect-appropriate, Distress- none acute.  + Overweight Skin- rash-none, lesions- none, excoriation- none; warm  Lymphadenopathy- none Head- atraumatic            Eyes- Gross vision intact, PERRLA, conjunctivae clear secretions            Ears- Hearing, canals-normal            Nose- Clear, no-Septal dev, mucus, polyps, erosion, perforation             Throat- Mallampati II , mucosa red , drainage- none, tonsils- atrophic Neck- flexible , trachea midline, no stridor , thyroid nl, carotid no bruit Chest - symmetrical excursion , unlabored           Heart/CV- + AF/ IRR , no murmur , no gallop  , no rub, nl s1 s2                           -  JVD- none , edema- none, stasis changes- none, varices- none           Lung- + clear sounds/  distant, wheeze-none, unlabored, cough- none , dullness-none, rub- none           Chest wall-  Abd- Br/ Gen/ Rectal- Not done, not indicated Extrem- cyanosis- none, clubbing, none, atrophy- none, strength- nl, Neg Homan's Neuro- grossly  intact to observation

## 2019-05-01 NOTE — Assessment & Plan Note (Signed)
Trelegy is working well, much better than Bevespi did. She is stable without exacerbation, following Covid precautions. Plan- script for Trelegy and apply for product cost assistance.

## 2019-05-01 NOTE — Patient Instructions (Signed)
Application and script for Trelegy- I'm really glad this has helped.  Please call if we can help

## 2019-05-06 ENCOUNTER — Other Ambulatory Visit: Payer: Self-pay

## 2019-05-06 ENCOUNTER — Ambulatory Visit (INDEPENDENT_AMBULATORY_CARE_PROVIDER_SITE_OTHER)
Admission: RE | Admit: 2019-05-06 | Discharge: 2019-05-06 | Disposition: A | Discharge status: Home / Self Care | Payer: Medicare Other | Source: Ambulatory Visit | Attending: Nurse Practitioner | Admitting: Nurse Practitioner

## 2019-05-06 DIAGNOSIS — Z780 Asymptomatic menopausal state: Secondary | ICD-10-CM

## 2019-05-15 ENCOUNTER — Other Ambulatory Visit: Payer: Self-pay | Admitting: Cardiovascular Disease

## 2019-05-22 ENCOUNTER — Other Ambulatory Visit: Payer: Self-pay | Admitting: Cardiovascular Disease

## 2019-05-22 ENCOUNTER — Telehealth: Payer: Self-pay | Admitting: Cardiovascular Disease

## 2019-05-22 NOTE — Telephone Encounter (Signed)
I called pt to confirm her appt for 05-23-19 with Dr C.       1. Confirm consent - "In the setting of the current Covid19 crisis, you are scheduled for a (phone or video) visit with your provider on (date) at (time).  Just as we do with many in-office visits, in order for you to participate in this visit, we must obtain consent.  If you'd like, I can send this to your mychart (if signed up) or email for you to review.  Otherwise, I can obtain your verbal consent now.  All virtual visits are billed to your insurance company just like a normal visit would be.  By agreeing to a virtual visit, we'd like you to understand that the technology does not allow for your provider to perform an examination, and thus may limit your provider's ability to fully assess your condition. If your provider identifies any concerns that need to be evaluated in person, we will make arrangements to do so.  Finally, though the technology is pretty good, we cannot assure that it will always work on either your or our end, and in the setting of a video visit, we may have to convert it to a phone-only visit.  In either situation, we cannot ensure that we have a secure connection.  Are you willing to proceed?" STAFF: Did the patient verbally acknowledge consent to telehealth visit? Document YES/NO here: Yes    FULL LENGTH CONSENT FOR TELE-HEALTH VISIT   I hereby voluntarily request, consent and authorize [facility] and its employed or contracted physicians, physician assistants, nurse practitioners or other licensed health care professionals (the Practitioner), to provide me with telemedicine health care services (the Services") as deemed necessary by the treating Practitioner. I acknowledge and consent to receive the Services by the Practitioner via telemedicine. I understand that the telemedicine visit will involve communicating with the Practitioner through live audiovisual communication technology and the disclosure of  certain medical information by electronic transmission. I acknowledge that I have been given the opportunity to request an in-person assessment or other available alternative prior to the telemedicine visit and am voluntarily participating in the telemedicine visit.  I understand that I have the right to withhold or withdraw my consent to the use of telemedicine in the course of my care at any time, without affecting my right to future care or treatment, and that the Practitioner or I may terminate the telemedicine visit at any time. I understand that I have the right to inspect all information obtained and/or recorded in the course of the telemedicine visit and may receive copies of available information for a reasonable fee.  I understand that some of the potential risks of receiving the Services via telemedicine include:   Delay or interruption in medical evaluation due to technological equipment failure or disruption;  Information transmitted may not be sufficient (e.g. poor resolution of images) to allow for appropriate medical decision making by the Practitioner; and/or   In rare instances, security protocols could fail, causing a breach of personal health information.  Furthermore, I acknowledge that it is my responsibility to provide information about my medical history, conditions and care that is complete and accurate to the best of my ability. I acknowledge that Practitioner's advice, recommendations, and/or decision may be based on factors not within their control, such as incomplete or inaccurate data provided by me or distortions of diagnostic images or specimens that may result from electronic transmissions. I understand that the practice of medicine  is not an Chief Strategy Officer and that Practitioner makes no warranties or guarantees regarding treatment outcomes. I acknowledge that I will receive a copy of this consent concurrently upon execution via email to the email address I last provided but  may also request a printed copy by calling the office of [place].    I understand that my insurance will be billed for this visit.   I have read or had this consent read to me.  I understand the contents of this consent, which adequately explains the benefits and risks of the Services being provided via telemedicine.   I have been provided ample opportunity to ask questions regarding this consent and the Services and have had my questions answered to my satisfaction.  I give my informed consent for the services to be provided through the use of telemedicine in my medical care  By participating in this telemedicine visit I agree to the above. t

## 2019-05-23 ENCOUNTER — Telehealth (INDEPENDENT_AMBULATORY_CARE_PROVIDER_SITE_OTHER): Payer: Medicare Other | Admitting: Cardiovascular Disease

## 2019-05-23 VITALS — BP 115/69 | HR 96 | Ht 63.0 in | Wt 191.0 lb

## 2019-05-23 DIAGNOSIS — I119 Hypertensive heart disease without heart failure: Secondary | ICD-10-CM | POA: Diagnosis not present

## 2019-05-23 DIAGNOSIS — E1169 Type 2 diabetes mellitus with other specified complication: Secondary | ICD-10-CM

## 2019-05-23 DIAGNOSIS — I4811 Longstanding persistent atrial fibrillation: Secondary | ICD-10-CM

## 2019-05-23 DIAGNOSIS — Z7901 Long term (current) use of anticoagulants: Secondary | ICD-10-CM

## 2019-05-23 DIAGNOSIS — E7800 Pure hypercholesterolemia, unspecified: Secondary | ICD-10-CM

## 2019-05-23 DIAGNOSIS — E669 Obesity, unspecified: Secondary | ICD-10-CM

## 2019-05-23 DIAGNOSIS — J449 Chronic obstructive pulmonary disease, unspecified: Secondary | ICD-10-CM | POA: Diagnosis not present

## 2019-05-23 DIAGNOSIS — D509 Iron deficiency anemia, unspecified: Secondary | ICD-10-CM

## 2019-05-23 DIAGNOSIS — I10 Essential (primary) hypertension: Secondary | ICD-10-CM

## 2019-05-23 MED ORDER — ROSUVASTATIN CALCIUM 10 MG PO TABS: 10.0000 mg | ORAL_TABLET | Freq: Every day | ORAL | 3 refills | Status: DC

## 2019-05-23 NOTE — Patient Instructions (Signed)
Medication Instructions:  INCREASE the Rosuvastatin to 10 mg once daily  If you need a refill on your cardiac medications before your next appointment, please call your pharmacy.   Lab work: None ordered If you have labs (blood work) drawn today and your tests are completely normal, you will receive your results only by: [name] MyChart Message (if you have MyChart) OR . A paper copy in the mail If you have any lab test that is abnormal or we need to change your treatment, we will call you to review the results.  Testing/Procedures: None ordered  Follow-Up: At [facility], you and your health needs are our priority.  As part of our continuing mission to provide you with exceptional heart care, we have created designated Provider Care Teams.  These Care Teams include your primary Cardiologist (physician) and Advanced Practice Providers (APPs -  Physician Assistants and Nurse Practitioners) who all work together to provide you with the care you need, when you need it. You will need a follow up appointment in 12 months.  Please call our office 2 months in advance to schedule this appointment.  You may see Sanda Klein, MD or one of the following Advanced Practice Providers on your designated Care Team: [place], [place] . Fabian Sharp, PA-C

## 2019-05-23 NOTE — Progress Notes (Signed)
Virtual Visit via Video Note   This visit type was conducted due to national recommendations for restrictions regarding the COVID-19 Pandemic (e.g. social distancing) in an effort to limit this patient's exposure and mitigate transmission in our community.  Due to her co-morbid illnesses, this patient is at least at moderate risk for complications without adequate follow up.  This format is felt to be most appropriate for this patient at this time.  All issues noted in this document were discussed and addressed.  A limited physical exam was performed with this format.  Please refer to the patient's chart for her consent to telehealth for [facility].   Date:  05/23/2019   ID:  Renee Ray, DOB 06-Jun-1941, MRN [id number]  Patient Location: Home Provider Location: Home  PCP:  Flossie Buffy, NP  Cardiologist:  Siomara Burkel Electrophysiologist:  None   Evaluation Performed:  Follow-Up Visit  Chief Complaint:  AFib  History of Present Illness:    Renee Ray is a 78 y.o. female with long-standing persistent atrial fibrillation, essential hypertension with LVH, history of iron deficiency anemia, mild obesity with type 2 diabetes mellitus on metformin therapy, COPD (moderate by most recent tests).  She is generally been in good health and continues to work twice a week at [employer].  She had a problem with a urinary tract infection in May that resolved promptly.  She has COPD and her breathing has improved substantially after she started Trelegy, to the point that she rarely needs a rescue inhaler.  She is planning a tooth extraction and wonders about the anticoagulant.  Her most recent labs showed glycemic control is not great with an A1c of 7.6% and there is been a surprisingly large jump in her LDL cholesterol from 96 to 131.  The patient specifically denies any chest pain at rest exertion, dyspnea at rest or with exertion, orthopnea, paroxysmal nocturnal dyspnea, syncope,  palpitations, focal neurological deficits, intermittent claudication, lower extremity edema, unexplained weight gain, cough, hemoptysis or wheezing.  She has not had any falls/injuries or bleeding/bruising with the Eliquis.  The patient does not have symptoms concerning for COVID-19 infection (fever, chills, cough, or new shortness of breath).    Past Medical History:  Diagnosis Date  . Abnormal echocardiogram 2010   showing slightly enlarged left atrium with normal LV function and normal PA pressures.   . Acute medial meniscal injury of knee    RIGHT KNEE  . Asthmatic bronchitis , chronic ([place])   . Atrial fibrillation ([place])   . Bruise    RIGHT ABD. DUE TO LOVENOX INJECTION  02-27-2012  . Chronic anticoagulation    coumadin  . Chronic atrial fibrillation    CARDIOLOGIST- DR BRACKBILL-- LAST VISIT  12-16-2011 IN [facility]  . Chronic renal insufficiency   . COPD, moderate ([place])    PULMOLOGIST- DR YOUNG --  LAST VISIT NOTE 02-23-2012 IN [facility]  . Diabetes mellitus    INSULIN AND ORAL MEDS  . Goiter 12/15   will have radiation in February  . History of pneumonia    associated w/  rhabdomyolysis  OCT 2012  . Hyperlipidemia   . Hypertension   . Non-ischemic cardiomyopathy ([place])    normal ef  . Normal nuclear stress test 2008  . OA (osteoarthritis) of knee    HANDS   Past Surgical History:  Procedure Laterality Date  . [place]--- BREAST IMPLANTS REMOVED   . KNEE ARTHROSCOPY  03/02/2012  Procedure: ARTHROSCOPY KNEE;  Surgeon: Gearlean Alf, MD;  Location: [facility];  Service: Orthopedics;  Laterality: Right;  WITH DEBRIDEMENT of medial menisces  . VAGINAL HYSTERECTOMY  1978     Current Meds  Medication Sig  . b complex vitamins capsule Take 1 capsule by mouth daily.   . calcium carbonate (OS-CAL) 600 MG TABS Take 600 mg by mouth 2 (two) times daily.   . calcium-vitamin D (OSCAL WITH D) 500-200 MG-UNIT tablet Take 1 tablet by mouth.   Arne Cleveland 5 MG TABS tablet TAKE 1 TABLET BY MOUTH TWICE A DAY  . fluticasone (FLONASE) 50 MCG/ACT nasal spray Place 2 sprays into both nostrils daily. (Patient taking differently: Place 2 sprays into both nostrils daily as needed for allergies. )  . Fluticasone-Umeclidin-Vilant (TRELEGY ELLIPTA) 100-62.5-25 MCG/INH AEPB Inhale 1 puff into the lungs daily.  . furosemide (LASIX) 40 MG tablet TAKE 1 TABLET (40 MG TOTAL) BY MOUTH DAILY.  [name] glucose blood test strip as directed.  . hydrochlorothiazide (HYDRODIURIL) 25 MG tablet TAKE 1 TABLET BY MOUTH EVERY DAY WITH LOSARTAN.  [name] Insulin NPH Isophane & Regular (NOVOLIN 70/30 FLEXPEN [place]) Inject 24-30 Units into the skin See admin instructions. Inject 30 units every morning  and 24 units at bedtime  . Insulin Pen Needle (PEN NEEDLES) 31G X 6 MM MISC 1 Units by Does not apply route daily.  [name] KLOR-CON M20 20 MEQ tablet TAKE 1 TABLET BY MOUTH DAILY *PLEASE SCHEDULE APPOINTMENT WITH OFFICE FOR REFILLS*  . levothyroxine (SYNTHROID) 137 MCG tablet Take 1 tablet (137 mcg total) by mouth daily before breakfast.  . losartan (COZAAR) 100 MG tablet TAKE 1 TABLET BY MOUTH EVERY DAY WITH HYDROCHLOROTHIAZIDE.  . metFORMIN (GLUCOPHAGE-XR) 500 MG 24 hr tablet Take 2,000 mg by mouth every evening.   . miconazole (MICOTIN) 2 % powder Apply topically as needed for itching.  . Multiple Vitamin (MULTIVITAMIN) tablet Take 1 tablet by mouth daily.   . Omega-3 Fatty Acids (FISH OIL PO) Take 1 tablet by mouth daily.   [name] PROAIR HFA 108 (90 Base) MCG/ACT inhaler INHALE 2 PUFFS EVERY 4 HOURS AS NEEDED. (Patient taking differently: Inhale 2 puffs into the lungs every 4 (four) hours as needed for wheezing. )  . rosuvastatin (CRESTOR) 5 MG tablet TAKE 1 TABLET BY MOUTH EVERY DAY  . triamcinolone cream (KENALOG) 0.1 % Apply 1 application topically 2 (two) times daily as needed.     Allergies:   Advil [ibuprofen], Janumet [sitagliptin-metformin hcl], Amoxicillin, and Sulfonamide  derivatives   Social History   Tobacco Use  . Smoking status: Former Smoker    Packs/day: 1.00    Years: 20.00    Pack years: 20.00    Types: Cigarettes    Quit date: 05/05/1996    Years since quitting: 23.0  . Smokeless tobacco: Never Used  Substance Use Topics  . Alcohol use: No    Alcohol/week: 0.0 standard drinks  . Drug use: No     Family Hx: The patient's family history includes Arrhythmia in her mother; Diabetes in her brother; Heart attack in her father; Heart disease in her brother; Heart failure in her mother; Stroke in her mother.  ROS:   Please see the history of present illness.     All other systems reviewed and are negative.   Prior CV studies:   The following studies were reviewed today: Notes, labs from emergency room visit 03/25/2019  Labs/Other Tests and Data Reviewed:    EKG:  An ECG dated 03/25/2019 was personally reviewed today and demonstrated:  Afib, incomplete RBBB, nonspecific ST-T changes  Recent Labs: 03/25/2019: BUN 24; Creatinine, Ser 1.05; Potassium 3.3; Sodium 135 04/10/2019: ALT 22; Hemoglobin 12.3; Platelets 398.0; TSH 3.68   Recent Lipid Panel Lab Results  Component Value Date/Time   CHOL 215 (H) 04/10/2019 09:59 AM   TRIG 108.0 04/10/2019 09:59 AM   HDL 62.50 04/10/2019 09:59 AM   CHOLHDL 3 04/10/2019 09:59 AM   LDLCALC 131 (H) 04/10/2019 09:59 AM   LDLDIRECT 165.6 12/16/2011 09:42 AM    Wt Readings from Last 3 Encounters:  05/23/19 191 lb (86.6 kg)  05/01/19 191 lb 9.6 oz (86.9 kg)  04/10/19 193 lb 9.6 oz (87.8 kg)     Objective:    Vital Signs:  BP 115/69 Comment: Took last night.  Pulse 96   Ht 5\' 3"  (1.6 m)   Wt 191 lb (86.6 kg)   BMI 33.83 kg/m    VITAL SIGNS:  reviewed GEN:  no acute distress EYES:  sclerae anicteric, EOMI - Extraocular Movements Intact RESPIRATORY:  normal respiratory effort, symmetric expansion CARDIOVASCULAR:  no peripheral edema SKIN:  no rash, lesions or ulcers. MUSCULOSKELETAL:  no  obvious deformities. NEURO:  alert and oriented x 3, no obvious focal deficit PSYCH:  normal affect  ASSESSMENT & PLAN:    1. AFib: Well rate controlled on appropriate anticoagulation. CHADSVasc 5 (age 64, gender, hypertension, diabetes) 2. Anticoagulation: No bleeding complications.  Okay to interrupt Eliquis for 24-48 hours before tooth extraction. 3. HTN: Well-controlled.  She has LVH by echocardiography but normal left ventricular systolic function.  No clinical heart failure. 4. HLP: LDL cholesterol target is less than 100.  Increase rosuvastatin to 10 mg daily.  She is already set up for repeat labs with her PCP in several months.  5. DM: A1c a little bit above target at 7.6%.  Improved glycemic control may help bring her cholesterol back down as well. 6. Obesity: She has actually lost some weight since I saw her last year, which makes it puzzling that her metabolic parameters have worsened.  Probably needs to increase physical activity.  7. Anemia: Resolved, recent hemoglobin 12.3.  COVID-19 Education: The signs and symptoms of COVID-19 were discussed with the patient and how to seek care for testing (follow up with PCP or arrange E-visit).  The importance of social distancing was discussed today.  Time:   Today, I have spent 15 minutes with the patient with telehealth technology discussing the above problems.     Medication Adjustments/Labs and Tests Ordered: Current medicines are reviewed at length with the patient today.  Concerns regarding medicines are outlined above.   Tests Ordered: No orders of the defined types were placed in this encounter.   Medication Changes: No orders of the defined types were placed in this encounter.   Follow Up:  Virtual Visit or In Person 12 months  Signed, Sanda Klein, MD  05/23/2019 9:27 AM    [place]

## 2019-06-17 ENCOUNTER — Other Ambulatory Visit: Payer: Self-pay | Admitting: Primary Care

## 2019-06-20 ENCOUNTER — Other Ambulatory Visit: Payer: Self-pay | Admitting: Cardiovascular Disease

## 2019-06-27 ENCOUNTER — Other Ambulatory Visit: Payer: Self-pay | Admitting: Cardiovascular Disease

## 2019-06-27 ENCOUNTER — Telehealth: Payer: Self-pay | Admitting: Nurse Practitioner

## 2019-06-27 NOTE — Telephone Encounter (Signed)
I called office and left message on voicemail to call office and schedule 6 month follow up appointment in 10/2019 per AVS from last office. Patient needs to schedule a office visit to be seen in the office for this appointment in December 2020.

## 2019-07-09 ENCOUNTER — Other Ambulatory Visit: Payer: Self-pay | Admitting: Cardiovascular Disease

## 2019-07-11 ENCOUNTER — Telehealth: Payer: Self-pay | Admitting: Nurse Practitioner

## 2019-07-11 NOTE — Telephone Encounter (Signed)

## 2019-07-12 ENCOUNTER — Ambulatory Visit (INDEPENDENT_AMBULATORY_CARE_PROVIDER_SITE_OTHER): Payer: Medicare Other | Admitting: Nurse Practitioner

## 2019-07-12 ENCOUNTER — Other Ambulatory Visit: Payer: Self-pay

## 2019-07-12 ENCOUNTER — Ambulatory Visit: Payer: Medicare Other | Admitting: Nurse Practitioner

## 2019-07-12 ENCOUNTER — Encounter: Payer: Self-pay | Admitting: Nurse Practitioner

## 2019-07-12 VITALS — BP 160/74 | HR 78 | Temp 97.0°F | Ht 63.0 in | Wt 198.6 lb

## 2019-07-12 DIAGNOSIS — H6123 Impacted cerumen, bilateral: Secondary | ICD-10-CM | POA: Diagnosis not present

## 2019-07-12 NOTE — Progress Notes (Signed)
Subjective:  Patient ID: CRYSTALINA AVALLONE, female    DOB: June 22, 1941  Age: 78 y.o. MRN: [id number]  CC: Ear Fullness (pt is c/o of left ear stop up--cant hear that good/no flu shot)  Ear Fullness  There is pain in both ears. This is a new problem. The current episode started in the past 7 days. The problem occurs constantly. The problem has been unchanged. There has been no fever. Associated symptoms include hearing loss. Pertinent negatives include no abdominal pain, coughing, diarrhea, ear discharge, headaches, neck pain, rash, rhinorrhea, sore throat or vomiting. She has tried nothing for the symptoms. Her past medical history is significant for hearing loss.   Reviewed past Medical, Social and Family history today.  Outpatient Medications Prior to Visit  Medication Sig Dispense Refill  . b complex vitamins capsule Take 1 capsule by mouth daily.     . calcium carbonate (OS-CAL) 600 MG TABS Take 600 mg by mouth 2 (two) times daily.     . calcium-vitamin D (OSCAL WITH D) 500-200 MG-UNIT tablet Take 1 tablet by mouth.    Arne Cleveland 5 MG TABS tablet TAKE 1 TABLET BY MOUTH TWICE A DAY 60 tablet 5  . fluticasone (FLONASE) 50 MCG/ACT nasal spray Place 2 sprays into both nostrils daily. (Patient taking differently: Place 2 sprays into both nostrils daily as needed for allergies. ) 16 g 0  . furosemide (LASIX) 40 MG tablet TAKE 1 TABLET (40 MG TOTAL) BY MOUTH DAILY. 90 tablet 3  . glucose blood test strip as directed.    . hydrochlorothiazide (HYDRODIURIL) 25 MG tablet TAKE 1 TABLET BY MOUTH EVERY DAY WITH LOSARTAN. 40 tablet 3  . Insulin NPH Isophane & Regular (NOVOLIN 70/30 FLEXPEN [place]) Inject 24-30 Units into the skin See admin instructions. Inject 30 units every morning  and 24 units at bedtime    . Insulin Pen Needle (PEN NEEDLES) 31G X 6 MM MISC 1 Units by Does not apply route daily. 100 each 3  . KLOR-CON M20 20 MEQ tablet TAKE 1 TABLET BY MOUTH EVERY DAY 90 tablet 0  . levothyroxine  (SYNTHROID) 137 MCG tablet Take 1 tablet (137 mcg total) by mouth daily before breakfast. 90 tablet 1  . losartan (COZAAR) 100 MG tablet TAKE 1 TABLET BY MOUTH EVERY DAY WITH HYDROCHLOROTHIAZIDE. 40 tablet 1  . metFORMIN (GLUCOPHAGE-XR) 500 MG 24 hr tablet Take 2,000 mg by mouth every evening.     . miconazole (MICOTIN) 2 % powder Apply topically as needed for itching. 70 g 0  . Multiple Vitamin (MULTIVITAMIN) tablet Take 1 tablet by mouth daily.     . Omega-3 Fatty Acids (FISH OIL PO) Take 1 tablet by mouth daily.     [name] PROAIR HFA 108 (90 Base) MCG/ACT inhaler INHALE 2 PUFFS EVERY 4 HOURS AS NEEDED. (Patient taking differently: Inhale 2 puffs into the lungs every 4 (four) hours as needed for wheezing. ) 8.5 Inhaler 4  . rosuvastatin (CRESTOR) 10 MG tablet Take 1 tablet (10 mg total) by mouth daily. 90 tablet 3  . TRELEGY ELLIPTA 100-62.5-25 MCG/INH AEPB INHALE 1 PUFF BY MOUTH INTO THE LUNGS ONCE DAILY 60 each 3  . triamcinolone cream (KENALOG) 0.1 % Apply 1 application topically 2 (two) times daily as needed. 80 g 1   No facility-administered medications prior to visit.     ROS See HPI  Objective:  BP (!) 160/74   Pulse 78   Temp (!) 97 F (36.1 C) (Tympanic)  Ht 5\' 3"  (1.6 m)   Wt 198 lb 9.6 oz (90.1 kg)   SpO2 98%   BMI 35.18 kg/m   BP Readings from Last 3 Encounters:  07/12/19 (!) 160/74  05/23/19 115/69  05/01/19 132/80    Wt Readings from Last 3 Encounters:  07/12/19 198 lb 9.6 oz (90.1 kg)  05/23/19 191 lb (86.6 kg)  05/01/19 191 lb 9.6 oz (86.9 kg)    Physical Exam Vitals signs reviewed.  HENT:     Right Ear: There is impacted cerumen.     Left Ear: There is impacted cerumen.  Neck:     Musculoskeletal: Normal range of motion and neck supple.  Cardiovascular:     Pulses: Normal pulses.  Pulmonary:     Effort: Pulmonary effort is normal.  Neurological:     Mental Status: She is alert and oriented to person, place, and time.     Lab Results  Component  Value Date   WBC 6.5 04/10/2019   HGB 12.3 04/10/2019   HCT 37.6 04/10/2019   PLT 398.0 04/10/2019   GLUCOSE 109 (H) 03/25/2019   CHOL 215 (H) 04/10/2019   TRIG 108.0 04/10/2019   HDL 62.50 04/10/2019   LDLDIRECT 165.6 12/16/2011   LDLCALC 131 (H) 04/10/2019   ALT 22 04/10/2019   AST 18 04/10/2019   NA 135 03/25/2019   K 3.3 (L) 03/25/2019   CL 100 03/25/2019   CREATININE 1.05 (H) 03/25/2019   BUN 24 (H) 03/25/2019   CO2 23 03/25/2019   TSH 3.68 04/10/2019   INR 1.6 03/11/2015   HGBA1C 7.6 (H) 04/10/2019   MICROALBUR <0.7 10/09/2018   Procedure Note :     Procedure :  Ear irrigation (bilateral)   Indication:  Hearing loss due to Cerumen impaction (bilateral)   Risks, including pain, dizziness, eardrum perforation, bleeding, infection and others as well as benefits were explained to the patient in detail. Verbal consent was obtained and the patient agreed to proceed.    We used "The Elephant Ear Irrigation Device" filled with lukewarm water for irrigation. A large amount wax was recovered. Procedure has also required manual wax removal with an ear loop.   Tolerated well. Complications: None.   Postprocedure instructions :  Call if problems.   Assessment & Plan:   Queena was seen today for ear fullness.  Diagnoses and all orders for this visit:  Hearing loss of both ears due to cerumen impaction   I am having Elizabeth Sauer. Seeger maintain her multivitamin, b complex vitamins, calcium carbonate, glucose blood, Omega-3 Fatty Acids (FISH OIL PO), fluticasone, ProAir HFA, miconazole, Pen Needles, furosemide, metFORMIN, Insulin NPH Isophane & Regular (NOVOLIN 70/30 FLEXPEN [place]), Eliquis, levothyroxine, triamcinolone cream, calcium-vitamin D, rosuvastatin, Trelegy Ellipta, hydrochlorothiazide, losartan, and [place]-Con M20.  No orders of the defined types were placed in this encounter.   Problem List Items Addressed This Visit    None    Visit Diagnoses    Hearing loss of  both ears due to cerumen impaction    -  Primary       Follow-up: Return in about 4 days (around 07/16/2019) for right ear irrigation.  Wilfred Lacy, NP

## 2019-07-12 NOTE — Patient Instructions (Signed)
Apply 5drops of debrox in right ear canal for next 5days.   Earwax Buildup, Adult The ears produce a substance called earwax that helps keep bacteria out of the ear and protects the skin in the ear canal. Occasionally, earwax can build up in the ear and cause discomfort or hearing loss. What increases the risk? This condition is more likely to develop in people who:  Are female.  Are elderly.  Naturally produce more earwax.  Clean their ears often with cotton swabs.  Use earplugs often.  Use in-ear headphones often.  Wear hearing aids.  Have narrow ear canals.  Have earwax that is overly thick or sticky.  Have eczema.  Are dehydrated.  Have excess hair in the ear canal. What are the signs or symptoms? Symptoms of this condition include:  Reduced or muffled hearing.  A feeling of fullness in the ear or feeling that the ear is plugged.  Fluid coming from the ear.  Ear pain.  Ear itch.  Ringing in the ear.  Coughing.  An obvious piece of earwax that can be seen inside the ear canal. How is this diagnosed? This condition may be diagnosed based on:  Your symptoms.  Your medical history.  An ear exam. During the exam, your health care provider will look into your ear with an instrument called an otoscope. You may have tests, including a hearing test. How is this treated? This condition may be treated by:  Using ear drops to soften the earwax.  Having the earwax removed by a health care provider. The health care provider may: ? Flush the ear with water. ? Use an instrument that has a loop on the end (curette). ? Use a suction device.  Surgery to remove the wax buildup. This may be done in severe cases. Follow these instructions at home:   Take over-the-counter and prescription medicines only as told by your health care provider.  Do not put any objects, including cotton swabs, into your ear. You can clean the opening of your ear canal with a washcloth  or facial tissue.  Follow instructions from your health care provider about cleaning your ears. Do not over-clean your ears.  Drink enough fluid to keep your urine clear or pale yellow. This will help to thin the earwax.  Keep all follow-up visits as told by your health care provider. If earwax builds up in your ears often or if you use hearing aids, consider seeing your health care provider for routine, preventive ear cleanings. Ask your health care provider how often you should schedule your cleanings.  If you have hearing aids, clean them according to instructions from the manufacturer and your health care provider. Contact a health care provider if:  You have ear pain.  You develop a fever.  You have blood, pus, or other fluid coming from your ear.  You have hearing loss.  You have ringing in your ears that does not go away.  Your symptoms do not improve with treatment.  You feel like the room is spinning (vertigo). Summary  Earwax can build up in the ear and cause discomfort or hearing loss.  The most common symptoms of this condition include reduced or muffled hearing and a feeling of fullness in the ear or feeling that the ear is plugged.  This condition may be diagnosed based on your symptoms, your medical history, and an ear exam.  This condition may be treated by using ear drops to soften the earwax or by having  the earwax removed by a health care provider.  Do not put any objects, including cotton swabs, into your ear. You can clean the opening of your ear canal with a washcloth or facial tissue. This information is not intended to replace advice given to you by your health care provider. Make sure you discuss any questions you have with your health care provider. Document Released: 12/01/2004 Document Revised: 10/06/2017 Document Reviewed: 01/04/2017 Elsevier Patient Education  2020 [employer].

## 2019-07-14 ENCOUNTER — Encounter: Payer: Self-pay | Admitting: Nurse Practitioner

## 2019-07-16 ENCOUNTER — Ambulatory Visit (INDEPENDENT_AMBULATORY_CARE_PROVIDER_SITE_OTHER): Payer: Medicare Other | Admitting: Nurse Practitioner

## 2019-07-16 ENCOUNTER — Encounter: Payer: Self-pay | Admitting: Nurse Practitioner

## 2019-07-16 ENCOUNTER — Other Ambulatory Visit: Payer: Self-pay

## 2019-07-16 VITALS — BP 122/68 | HR 88 | Temp 96.9°F | Ht 63.0 in | Wt 193.4 lb

## 2019-07-16 DIAGNOSIS — H6121 Impacted cerumen, right ear: Secondary | ICD-10-CM | POA: Diagnosis not present

## 2019-07-16 NOTE — Patient Instructions (Signed)
Residual cerumen in right ear canal Let me know if you change you mind about referral to ENT.

## 2019-07-16 NOTE — Progress Notes (Signed)
Subjective:  Patient ID: Renee Ray, female    DOB: 07-23-1941  Age: 78 y.o. MRN: [id number]  CC: Follow-up (follow up on right ear fullness--has been using the drops)  HPI Ms. Renee Ray is here for ear irrigation after use of debrox drops x 4days. She denies any ear pain of dizziness or fever, or swollen lymph nodes or neack pain or headache.  Reviewed past Medical, Social and Family history today.  Outpatient Medications Prior to Visit  Medication Sig Dispense Refill   b complex vitamins capsule Take 1 capsule by mouth daily.      calcium carbonate (OS-CAL) 600 MG TABS Take 600 mg by mouth 2 (two) times daily.      calcium-vitamin D (OSCAL WITH D) 500-200 MG-UNIT tablet Take 1 tablet by mouth.     ELIQUIS 5 MG TABS tablet TAKE 1 TABLET BY MOUTH TWICE A DAY 60 tablet 5   fluticasone (FLONASE) 50 MCG/ACT nasal spray Place 2 sprays into both nostrils daily. (Patient taking differently: Place 2 sprays into both nostrils daily as needed for allergies. ) 16 g 0   furosemide (LASIX) 40 MG tablet TAKE 1 TABLET (40 MG TOTAL) BY MOUTH DAILY. 90 tablet 3   glucose blood test strip as directed.     hydrochlorothiazide (HYDRODIURIL) 25 MG tablet TAKE 1 TABLET BY MOUTH EVERY DAY WITH LOSARTAN. 40 tablet 3   Insulin NPH Isophane & Regular (NOVOLIN 70/30 FLEXPEN [place]) Inject 24-30 Units into the skin See admin instructions. Inject 30 units every morning  and 24 units at bedtime     Insulin Pen Needle (PEN NEEDLES) 31G X 6 MM MISC 1 Units by Does not apply route daily. 100 each 3   KLOR-CON M20 20 MEQ tablet TAKE 1 TABLET BY MOUTH EVERY DAY 90 tablet 0   levothyroxine (SYNTHROID) 137 MCG tablet Take 1 tablet (137 mcg total) by mouth daily before breakfast. 90 tablet 1   losartan (COZAAR) 100 MG tablet TAKE 1 TABLET BY MOUTH EVERY DAY WITH HYDROCHLOROTHIAZIDE. 40 tablet 1   metFORMIN (GLUCOPHAGE-XR) 500 MG 24 hr tablet Take 2,000 mg by mouth every evening.      miconazole (MICOTIN) 2 %  powder Apply topically as needed for itching. 70 g 0   Multiple Vitamin (MULTIVITAMIN) tablet Take 1 tablet by mouth daily.      Omega-3 Fatty Acids (FISH OIL PO) Take 1 tablet by mouth daily.      PROAIR HFA 108 (90 Base) MCG/ACT inhaler INHALE 2 PUFFS EVERY 4 HOURS AS NEEDED. (Patient taking differently: Inhale 2 puffs into the lungs every 4 (four) hours as needed for wheezing. ) 8.5 Inhaler 4   rosuvastatin (CRESTOR) 10 MG tablet Take 1 tablet (10 mg total) by mouth daily. 90 tablet 3   TRELEGY ELLIPTA 100-62.5-25 MCG/INH AEPB INHALE 1 PUFF BY MOUTH INTO THE LUNGS ONCE DAILY 60 each 3   triamcinolone cream (KENALOG) 0.1 % Apply 1 application topically 2 (two) times daily as needed. 80 g 1   No facility-administered medications prior to visit.     ROS See HPI  Objective:  BP 122/68    Pulse 88    Temp (!) 96.9 F (36.1 C) (Tympanic)    Ht 5\' 3"  (1.6 m)    Wt 193 lb 6.4 oz (87.7 kg)    SpO2 96%    BMI 34.26 kg/m   BP Readings from Last 3 Encounters:  07/16/19 122/68  07/12/19 (!) 160/74  05/23/19 115/69  Wt Readings from Last 3 Encounters:  07/16/19 193 lb 6.4 oz (87.7 kg)  07/12/19 198 lb 9.6 oz (90.1 kg)  05/23/19 191 lb (86.6 kg)   Physical Exam HENT:     Right Ear: Ear canal and external ear normal. There is impacted cerumen.     Left Ear: Tympanic membrane, ear canal and external ear normal. There is no impacted cerumen.  Neck:     Musculoskeletal: Normal range of motion and neck supple.  Cardiovascular:     Pulses: Normal pulses.  Pulmonary:     Effort: Pulmonary effort is normal.  Lymphadenopathy:     Cervical: No cervical adenopathy.  Neurological:     Mental Status: She is alert and oriented to person, place, and time.     Lab Results  Component Value Date   WBC 6.5 04/10/2019   HGB 12.3 04/10/2019   HCT 37.6 04/10/2019   PLT 398.0 04/10/2019   GLUCOSE 109 (H) 03/25/2019   CHOL 215 (H) 04/10/2019   TRIG 108.0 04/10/2019   HDL 62.50 04/10/2019     LDLDIRECT 165.6 12/16/2011   LDLCALC 131 (H) 04/10/2019   ALT 22 04/10/2019   AST 18 04/10/2019   NA 135 03/25/2019   K 3.3 (L) 03/25/2019   CL 100 03/25/2019   CREATININE 1.05 (H) 03/25/2019   BUN 24 (H) 03/25/2019   CO2 23 03/25/2019   TSH 3.68 04/10/2019   INR 1.6 03/11/2015   HGBA1C 7.6 (H) 04/10/2019   MICROALBUR <0.7 10/09/2018    Dg Bone Density  Result Date: 05/12/2019 Date of study: 05/06/19 Exam: DUAL X-RAY ABSORPTIOMETRY (DXA) FOR BONE MINERAL DENSITY (BMD) Instrument: [employer] Provider: PCP Indication: screening for osteoporosis Comparison: none (please note that it is not possible to compare data from different instruments) Clinical data: Pt is a 78 y.o. female without previous fractures. Results:  Lumbar spine L1-L4 Femoral neck (FN) 33% distal radius T-score 2.3 RFN: -0.4 LFN: 0.5 n/a Change in BMD from previous DXA test (%) Up 22.3% Down 6.1% n/a (*) statistically significant Assessment: the BMD is normal according to the [facility] classification for osteoporosis (see below). Fracture risk: low FRAX score: not calculated due to normal BMD Comments: the technical quality of the study is good  L3 is excluded due to degenerative change.  Scoliosis and degenerative change may falsely elevate spine score Recommend optimizing calcium (1200 mg/day) and vitamin D (800 IU/day) intake. No pharmacological treatment is indicated. Followup: Repeat BMD is appropriate after 2 years. WHO criteria for diagnosis of osteoporosis in postmenopausal women and in men 60 y/o or older: - normal: T-score -1.0 to + 1.0 - osteopenia/low bone density: T-score between -2.5 and -1.0 - osteoporosis: T-score below -2.5 - severe osteoporosis: T-score below -2.5 with history of fragility fracture Note: although not part of the WHO classification, the presence of a fragility fracture, regardless of the T-score, should be considered diagnostic of osteoporosis, provided other causes for the fracture  have been excluded. Loura Pardon MD   Assessment & Plan:  Procedure Note :     Procedure :  Ear irrigation (right)  Indication:  Cerumen impaction (right)  Risks, including pain, dizziness, eardrum perforation, bleeding, infection and others as well as benefits were explained to the patient in detail. Verbal consent was obtained and the patient agreed to proceed.    We used "The Elephant Ear Irrigation Device" filled with lukewarm water for irrigation. A small amount wax was recovered. Procedure has also required manual wax  removal with an ear loop.  Tolerated well but difficult to completely remove cerumen Complications: None.  Postprocedure instructions :  Recommended referral to ENT for another attempt to remove cerumen, but Ms. Renee Ray declined at this time  Renee Ray was seen today for follow-up.  Diagnoses and all orders for this visit:  Hearing loss of right ear due to cerumen impaction   I am having Elizabeth Sauer. Cassler maintain her multivitamin, b complex vitamins, calcium carbonate, glucose blood, Omega-3 Fatty Acids (FISH OIL PO), fluticasone, ProAir HFA, miconazole, Pen Needles, furosemide, metFORMIN, Insulin NPH Isophane & Regular (NOVOLIN 70/30 FLEXPEN ), Eliquis, levothyroxine, triamcinolone cream, calcium-vitamin D, rosuvastatin, Trelegy Ellipta, hydrochlorothiazide, losartan, and Klor-Con M20.  No orders of the defined types were placed in this encounter.   Problem List Items Addressed This Visit    None    Visit Diagnoses    Hearing loss of right ear due to cerumen impaction    -  Primary       Follow-up: Return if symptoms worsen or fail to improve.  Wilfred Lacy, NP

## 2019-07-17 ENCOUNTER — Encounter: Payer: Self-pay | Admitting: Nurse Practitioner

## 2019-08-09 ENCOUNTER — Ambulatory Visit (INDEPENDENT_AMBULATORY_CARE_PROVIDER_SITE_OTHER): Payer: Medicare Other

## 2019-08-09 ENCOUNTER — Other Ambulatory Visit: Payer: Self-pay

## 2019-08-09 DIAGNOSIS — Z23 Encounter for immunization: Secondary | ICD-10-CM | POA: Diagnosis not present

## 2019-09-13 ENCOUNTER — Other Ambulatory Visit: Payer: Self-pay | Admitting: Cardiovascular Disease

## 2019-09-17 ENCOUNTER — Other Ambulatory Visit: Payer: Self-pay | Admitting: Cardiovascular Disease

## 2019-10-09 ENCOUNTER — Other Ambulatory Visit: Payer: Self-pay | Admitting: Cardiovascular Disease

## 2019-10-09 NOTE — Telephone Encounter (Signed)
Eliquis refill request.

## 2019-10-11 ENCOUNTER — Other Ambulatory Visit: Payer: Self-pay

## 2019-10-11 ENCOUNTER — Telehealth (INDEPENDENT_AMBULATORY_CARE_PROVIDER_SITE_OTHER): Payer: Medicare Other | Admitting: Nurse Practitioner

## 2019-10-11 ENCOUNTER — Encounter: Payer: Self-pay | Admitting: Nurse Practitioner

## 2019-10-11 VITALS — HR 84 | Temp 96.8°F | Ht 63.0 in | Wt 194.6 lb

## 2019-10-11 DIAGNOSIS — E039 Hypothyroidism, unspecified: Secondary | ICD-10-CM

## 2019-10-11 DIAGNOSIS — M79604 Pain in right leg: Secondary | ICD-10-CM

## 2019-10-11 DIAGNOSIS — M545 Low back pain, unspecified: Secondary | ICD-10-CM | POA: Insufficient documentation

## 2019-10-11 DIAGNOSIS — M160 Bilateral primary osteoarthritis of hip: Secondary | ICD-10-CM | POA: Insufficient documentation

## 2019-10-11 DIAGNOSIS — G8929 Other chronic pain: Secondary | ICD-10-CM | POA: Insufficient documentation

## 2019-10-11 DIAGNOSIS — M79605 Pain in left leg: Secondary | ICD-10-CM

## 2019-10-11 DIAGNOSIS — I10 Essential (primary) hypertension: Secondary | ICD-10-CM

## 2019-10-11 MED ORDER — TRAMADOL HCL 50 MG PO TABS: 25.0000 mg | ORAL_TABLET | Freq: Every day | ORAL | 0 refills | Status: AC | PRN

## 2019-10-11 NOTE — Progress Notes (Signed)
Virtual Visit via Video Note  I connected with Renee Ray on 10/11/19 at 10:15 AM EST by a video enabled telemedicine application and verified that I am speaking with the correct person using two identifiers.  Location: Patient: home Provider: office  Participants: patient and provider I discussed the limitations of evaluation and management by telemedicine and the availability of in person appointments. The patient expressed understanding and agreed to proceed.  [id number] pain pain management and hypothyroidism  History of Present Illness: HTN: under management of Dr. Sallyanne Ray. Last OV 05/2019 BP at goal based on last OV with me. Renee Ray does not have BP machine at home.  Denies any headache or LE edema or dizziness or CP or palpitations BP Readings from Last 3 Encounters:  07/16/19 122/68  07/12/19 (!) 160/74  05/23/19 115/69   DM: managed by Dr. Buddy Ray. Has upcoming F2F appt on 10/15/19. Fasting glucose: 110s and PM glucose:>250   Hypothyroidism: Stable with 11mcg levothyroxine. Needs repeat TSH and T4  Leg pain at HS: onset 62month ago, intermittent, describes as arching, not associated with muscle weakness or paresthesia, improves with repositioning and leg elevation. Denies any recent leg or back injury.  Chronic back and Hip pain: Stable with use or tramadol half tab daily prn and tylenol prn Pain is worse in AM. Reviewed PAP database today: last fill 04/09/2019, #14, no red flag. Renee Ray denies any constipation or daytime somnolence or dizziness.  Observations/Objective: Physical Exam  Constitutional: Renee Ray is oriented to person, place, and time.  Pulmonary/Chest: Effort normal.  Neurological: Renee Ray is alert and oriented to person, place, and time.  Psychiatric: Renee Ray has a normal mood and affect. Her behavior is normal. Thought content normal.  Vitals reviewed. limited exam due to video call  Assessment and Plan: Renee Ray was seen today for follow-up.  Diagnoses and all  orders for this visit:  Hypothyroidism, unspecified type  Chronic bilateral low back pain without sciatica -     traMADol (ULTRAM) 50 MG tablet; Take 0.5 tablets (25 mg total) by mouth daily as needed.  Osteoarthritis of both hips, unspecified osteoarthritis type -     traMADol (ULTRAM) 50 MG tablet; Take 0.5 tablets (25 mg total) by mouth daily as needed.  Pain in both lower extremities  Essential hypertension   Follow Up Instructions: See avs   I discussed the assessment and treatment plan with the patient. The patient was provided an opportunity to ask questions and all were answered. The patient agreed with the plan and demonstrated an understanding of the instructions.   The patient was advised to call back or seek an in-person evaluation if the symptoms worsen or if the condition fails to improve as anticipated.  Wilfred Lacy, NP

## 2019-10-11 NOTE — Assessment & Plan Note (Addendum)
Dexa scan 04/2019: degenerative disc disease on lumbar spine and scoliosis. Waxing and waning, worse in morning. No radicular pain. No weakness, no paresthesia. Minimal improved with tylenol and hemp freeze cream OTC. Unable to use NSAIDs due to use of eliquis and asthma Use of tramadol once a day as needed. Has noticed change in posture. Improves with yoga and muscle strengthening exercises 3x/week. Also use acupunture in past with significant relief Plans resume accupuncture sessions.  Stable with use or tramadol half tab daily prn and tylenol prn Reviewed PAP database today: last fill 04/09/2019, #14, no red flag. She denies any constipation or daytime somnolence or dizziness. Sent tramadol refill: 1/2tabd daily prn, # 30 Refill due only after 12/2018. Need to complete controlled substance contract and collect UDS.

## 2019-10-11 NOTE — Assessment & Plan Note (Addendum)
Dg-hips 05/2016: Degenerative changes are noted bilateral hip joints with mild superior acetabular spurring. Narrowing of superior hip joint space bilaterally. Mild bilateral spurring of femoral head.  Waxing and waning, worse in morning. No radicular pain. No weakness, no paresthesia. Minimal improved with tylenol and hemp freeze cream OTC. Unable to use NSAIDs due to use of eliquis and asthma Use of tramadol once a day as needed. Has noticed change in posture. Improves with yoga and muscle strengthening exercises 3x/week. Also use acupunture in past with significant relief Plans resume accupuncture sessions.  Stable with use or tramadol half tab daily prn and tylenol prn Reviewed PAP database today: last fill 04/09/2019, #14, no red flag. She denies any constipation or daytime somnolence or dizziness. Sent tramadol refill: 1/2tabd daily prn, # 30 Refill due only after 12/2018. Need to complete controlled substance contract and collect UDS.

## 2019-10-11 NOTE — Patient Instructions (Addendum)
Please have Dr. Buddy Duty draw BMP, TSH, T4, and CK total on 10/15/2019. Results should be faxed to me. Send BP reading through mychart. Will obtain eye exam report from opthalmology. Continue tramadol 0.5tab daily prn for pain.

## 2019-10-15 LAB — BASIC METABOLIC PANEL
BUN: 23 — AB (ref 4–21)
CO2: 30 — AB (ref 13–22)
Chloride: 103 (ref 99–108)
Creatinine: 0.9 (ref 0.5–1.1)
Glucose: 70
Potassium: 4 (ref 3.4–5.3)
Sodium: 143 (ref 137–147)

## 2019-10-15 LAB — LIPID PANEL
Cholesterol: 163 (ref 0–200)
HDL: 68 (ref 35–70)
LDL Cholesterol: 79
Triglycerides: 90 (ref 40–160)

## 2019-10-15 LAB — VITAMIN B12: Vitamin B-12: 904

## 2019-10-15 LAB — COMPREHENSIVE METABOLIC PANEL
Calcium: 9.9 (ref 8.7–10.7)
GFR calc Af Amer: 77
GFR calc non Af Amer: 64

## 2019-10-15 LAB — TSH: TSH: 3.02 (ref 0.41–5.90)

## 2019-10-15 LAB — HEMOGLOBIN A1C: Hemoglobin A1C: 7.6

## 2019-11-14 ENCOUNTER — Telehealth: Payer: Self-pay | Admitting: Cardiovascular Disease

## 2019-11-14 NOTE — Telephone Encounter (Signed)
Patient calling stating she needs authorization to get the COVID vaccine, because she is on blood thinners. She says she is currently waiting with a nurse to get the injection.other

## 2019-11-14 NOTE — Telephone Encounter (Signed)
Contacted pt who is currently at a COVID-19 vaccine admin site. She states she needs authorization for vaccine. Advised her of the following email information received from Georgana Curio on 11/12/2019:  "We have received multiple calls from patients asking about whether they can have the Covid vaccine because they take blood thinners (warfarin, Eliquis, Xarelto, Pradaxa).  Apparently [facility] has a requirement that patients on these medications must have a signed consent form from their physician before getting vaccinated.  We reviewed this with Dr. Percival Spanish, who agreed with our assessment that being on blood thinners should NOT stop them from getting their vaccine.  Like with their annual flu shot, they might notice a bruise at the injection site, but otherwise we have no concerns.   I suspect that patients on anti-platelet drugs (clopidogrel, aspirin, Effient, Brilinta) will have the same questions, as many confuse the two categories."  Pt would like triage nurse to speak with nurse administering vaccine. Provided nurse with name and pt cardiologist name. No further questions.

## 2019-11-22 ENCOUNTER — Encounter: Payer: Self-pay | Admitting: Nurse Practitioner

## 2019-11-22 LAB — MICROALBUMIN/CREATININE RATIO, UR: Urine-Other: <46.7

## 2019-11-22 LAB — CK: CPK 2 (MB): 130

## 2019-11-22 LAB — T4, FREE: Free T4: 1.03

## 2019-11-22 NOTE — Progress Notes (Signed)
Abstracted result and sent to scan  

## 2019-11-28 ENCOUNTER — Other Ambulatory Visit: Payer: Self-pay | Admitting: Cardiovascular Disease

## 2019-12-19 DIAGNOSIS — M545 Low back pain: Secondary | ICD-10-CM | POA: Diagnosis not present

## 2019-12-19 DIAGNOSIS — M4716 Other spondylosis with myelopathy, lumbar region: Secondary | ICD-10-CM | POA: Diagnosis not present

## 2020-01-09 ENCOUNTER — Other Ambulatory Visit: Payer: Self-pay | Admitting: Nurse Practitioner

## 2020-01-09 ENCOUNTER — Other Ambulatory Visit: Payer: Self-pay | Admitting: Cardiovascular Disease

## 2020-01-09 DIAGNOSIS — E039 Hypothyroidism, unspecified: Secondary | ICD-10-CM

## 2020-01-23 ENCOUNTER — Ambulatory Visit: Payer: Medicare Other | Admitting: Podiatry

## 2020-01-23 ENCOUNTER — Encounter: Payer: Self-pay | Admitting: Podiatry

## 2020-01-23 ENCOUNTER — Other Ambulatory Visit: Payer: Self-pay

## 2020-01-23 DIAGNOSIS — D689 Coagulation defect, unspecified: Secondary | ICD-10-CM | POA: Diagnosis not present

## 2020-01-23 DIAGNOSIS — Q828 Other specified congenital malformations of skin: Secondary | ICD-10-CM

## 2020-01-23 NOTE — Progress Notes (Signed)
She presents today for follow-up of her adductovarus rotated hammertoe deformity and reactive hyperkeratotic lesion to the medial aspect of the fifth digit right foot.  Is been exquisitely painful for her as we have not seen her since October 2019.  Objective: Vital signs are stable alert and oriented x3.  Pulses are palpable.  Reactive hyperkeratotic lesion medial aspect fifth digit right foot distally thickened much greater than I have seen it in years but there is no underlying ulceration once debrided.  Assessment: Porokeratotic lesion fifth digit right foot.  Plan: Debrided reactive hyperkeratotic lesion follow-up with her 5 weeks

## 2020-03-05 ENCOUNTER — Other Ambulatory Visit: Payer: Self-pay

## 2020-03-05 ENCOUNTER — Ambulatory Visit: Payer: Medicare Other | Admitting: Podiatry

## 2020-03-05 DIAGNOSIS — D689 Coagulation defect, unspecified: Secondary | ICD-10-CM | POA: Diagnosis not present

## 2020-03-05 DIAGNOSIS — Q828 Other specified congenital malformations of skin: Secondary | ICD-10-CM | POA: Diagnosis not present

## 2020-03-05 NOTE — Progress Notes (Signed)
She presents today chief complaint of painful callus to the fifth toe on the right foot.  She states is really not any worse than it usually is.  Objective: Vital signs are stable alert oriented x3.  Pulses are palpable.  There is no erythema edema cellulitis drainage odor reactive hyperkeratotic tissue to the medial aspect of the fifth digit of the right foot.  Assessment: Adductovarus rotated hammertoe deformity resulting in reactive hyperkeratotic lesion medial aspect fifth digit right foot.  Plan: Mechanical debridement of the lesion today placed salicylic acid under occlusion to be left out 2 to 3 days without getting wet then washed off thoroughly.  She was instructed to wash it off should it become painful.  Otherwise I will follow-up with her in about 5 weeks for reevaluation

## 2020-03-24 ENCOUNTER — Other Ambulatory Visit: Payer: Self-pay | Admitting: Cardiovascular Disease

## 2020-03-29 ENCOUNTER — Other Ambulatory Visit: Payer: Self-pay | Admitting: Cardiovascular Disease

## 2020-04-01 ENCOUNTER — Other Ambulatory Visit: Payer: Self-pay | Admitting: Cardiovascular Disease

## 2020-04-01 ENCOUNTER — Other Ambulatory Visit: Payer: Self-pay | Admitting: *Deleted

## 2020-04-01 MED ORDER — ROSUVASTATIN CALCIUM 10 MG PO TABS: 10.0000 mg | ORAL_TABLET | Freq: Every day | ORAL | 0 refills | Status: DC

## 2020-04-01 MED ORDER — POTASSIUM CHLORIDE CRYS ER 20 MEQ PO TBCR: 20.0000 meq | EXTENDED_RELEASE_TABLET | Freq: Every day | ORAL | 0 refills | Status: DC

## 2020-04-01 MED ORDER — HYDROCHLOROTHIAZIDE 25 MG PO TABS: ORAL_TABLET | ORAL | 0 refills | Status: DC

## 2020-04-01 NOTE — Telephone Encounter (Signed)
*  STAT* If patient is at the pharmacy, call can be transferred to refill team.   1. Which medications need to be refilled? (please list name of each medication and dose if known)  KLOR-CON M20 20 MEQ tablet hydrochlorothiazide (HYDRODIURIL) 25 MG tablet  2. Which pharmacy/location (including street and city if local pharmacy) is medication to be sent to? [id number] - [place], [place] - [place]. AT [address]  3. Do they need a 30 day or 90 day supply? 90 day

## 2020-04-14 ENCOUNTER — Ambulatory Visit: Payer: Medicare Other | Admitting: Podiatry

## 2020-04-14 ENCOUNTER — Other Ambulatory Visit: Payer: Self-pay

## 2020-04-14 ENCOUNTER — Encounter: Payer: Self-pay | Admitting: Podiatry

## 2020-04-14 DIAGNOSIS — D689 Coagulation defect, unspecified: Secondary | ICD-10-CM | POA: Diagnosis not present

## 2020-04-14 DIAGNOSIS — Q828 Other specified congenital malformations of skin: Secondary | ICD-10-CM

## 2020-04-14 NOTE — Progress Notes (Signed)
She presents today chief complaint of a painful corn to the fifth toe right foot.  She denies fever chills nausea vomiting muscle aches pains calf pain back pain chest pain shortness of breath.  Objective: Vital signs stable oriented x3 pulses remain palpable right.  Adductovarus rotated hammertoe fifth right resulting in pain to the fourth toe and a porokeratotic lesion to the medial aspect of the fifth toe.  No open lesions or wounds are noted.  No signs of infection.  Assessment: Porokeratosis medial aspect second toe secondary to adductovarus rotation.  Plan: Debrided the area today placed padding follow-up with her as needed.

## 2020-04-23 ENCOUNTER — Ambulatory Visit: Payer: Medicare Other | Admitting: Physician Assistant

## 2020-04-23 ENCOUNTER — Other Ambulatory Visit: Payer: Self-pay

## 2020-04-23 ENCOUNTER — Encounter: Payer: Self-pay | Admitting: Physician Assistant

## 2020-04-23 VITALS — BP 130/72 | HR 87 | Temp 97.0°F | Ht 63.0 in | Wt 192.4 lb

## 2020-04-23 DIAGNOSIS — I4811 Longstanding persistent atrial fibrillation: Secondary | ICD-10-CM | POA: Diagnosis not present

## 2020-04-23 DIAGNOSIS — J449 Chronic obstructive pulmonary disease, unspecified: Secondary | ICD-10-CM | POA: Diagnosis not present

## 2020-04-23 DIAGNOSIS — R0600 Dyspnea, unspecified: Secondary | ICD-10-CM

## 2020-04-23 DIAGNOSIS — E119 Type 2 diabetes mellitus without complications: Secondary | ICD-10-CM | POA: Diagnosis not present

## 2020-04-23 DIAGNOSIS — I10 Essential (primary) hypertension: Secondary | ICD-10-CM | POA: Diagnosis not present

## 2020-04-23 DIAGNOSIS — R0609 Other forms of dyspnea: Secondary | ICD-10-CM

## 2020-04-23 NOTE — Patient Instructions (Signed)
Medication Instructions:  The current medical regimen is effective;  continue present plan and medications as directed. Please refer to the Current Medication list given to you today. *If you need a refill on your cardiac medications before your next appointment, please call your pharmacy*  Testing/Procedures: Echocardiogram - Your physician has requested that you have an echocardiogram. Echocardiography is a painless test that uses sound waves to create images of your heart. It provides your doctor with information about the size and shape of your heart and how well your heart's chambers and valves are working. This procedure takes approximately one hour. There are no restrictions for this procedure. This will be performed at our [facility] location - [address].  Follow-Up: Your next appointment:  1 month(s)  In Person with Sanda Klein, MD  At [facility], you and your health needs are our priority.  As part of our continuing mission to provide you with exceptional heart care, we have created designated Provider Care Teams.  These Care Teams include your primary Cardiologist (physician) and Advanced Practice Providers (APPs -  Physician Assistants and Nurse Practitioners) who all work together to provide you with the care you need, when you need it.  We recommend signing up for the patient portal called "MyChart".  Sign up information is provided on this After Visit Summary.  MyChart is used to connect with patients for Virtual Visits (Telemedicine).  Patients are able to view lab/test results, encounter notes, upcoming appointments, etc.  Non-urgent messages can be sent to your provider as well.   To learn more about what you can do with MyChart, go to [email or web address].

## 2020-04-23 NOTE — Progress Notes (Signed)
Cardiology Office Note:    Date:  04/25/2020   ID:  [name], DOB 07-Jul-1941, MRN [id number]  PCP:  Flossie Buffy, NP  [facility] Cardiologist:  Sanda Klein, MD  [place] Electrophysiologist:  None   Referring MD: Flossie Buffy, NP   Chief Complaint  Patient presents with  . Follow-up    seen for Dr. Sallyanne Kuster    History of Present Illness:    Renee Ray is a 79 y.o. female with a hx of longstanding persistent atrial fibrillation on Eliquis, hypertension, iron deficiency anemia, mild obesity, DM 2 and COPD.  Echocardiogram obtained on 03/12/2014 showed EF 60 to 65%, grade 2 DD, mild MR, normal RVEF.  Patient was last seen by Dr. Sallyanne Kuster on 05/23/2019 at which time she was doing well.  Patient presents today with complaint of increasing dyspnea on exertion.  On physical exam, he has diffusely diminished breath sound in her lungs.  Otherwise I do not see any significant lower extremity edema and she denies any orthopnea or PND.  She occasionally notice some ankle edema by the end of the day after she is on her feet for a long time.  Based on physical exam, I do not think she is volume overloaded to explain the recent dyspnea on exertion.  She denies any chest discomfort.  I think this is probably more pulmonary in nature or related to the recent heat.  I did recommend a complete echocardiogram to make sure her ejection fraction is normal and her mitral valve regurgitation has not progressed.  Otherwise we will see her back in 1 month.  Past Medical History:  Diagnosis Date  . Abnormal echocardiogram 2010   showing slightly enlarged left atrium with normal LV function and normal PA pressures.   . Acute medial meniscal injury of knee    RIGHT KNEE  . Asthmatic bronchitis , chronic ([place])   . Atrial fibrillation ([place])   . Bruise    RIGHT ABD. DUE TO LOVENOX INJECTION  02-27-2012  . Chronic anticoagulation    coumadin  . Chronic atrial fibrillation  ([place])    CARDIOLOGIST- DR BRACKBILL-- LAST VISIT  12-16-2011 IN [facility]  . Chronic renal insufficiency   . COPD, moderate ([place])    PULMOLOGIST- DR YOUNG --  LAST VISIT NOTE 02-23-2012 IN [facility]  . Diabetes mellitus    INSULIN AND ORAL MEDS  . Goiter 12/15   will have radiation in February  . History of pneumonia    associated w/  rhabdomyolysis  OCT 2012  . Hyperlipidemia   . Hypertension   . Non-ischemic cardiomyopathy ([place])    normal ef  . Normal nuclear stress test 2008  . OA (osteoarthritis) of knee    HANDS    Past Surgical History:  Procedure Laterality Date  . [place]--- BREAST IMPLANTS REMOVED   . KNEE ARTHROSCOPY  03/02/2012   Procedure: ARTHROSCOPY KNEE;  Surgeon: Gearlean Alf, MD;  Location: [facility];  Service: Orthopedics;  Laterality: Right;  WITH DEBRIDEMENT of medial menisces  . VAGINAL HYSTERECTOMY  1978    Current Medications: Current Meds  Medication Sig  . Ascorbic Acid (VITAMIN C PO) Take by mouth.  [name] b complex vitamins capsule Take 1 capsule by mouth daily.   . calcium carbonate (OS-CAL) 600 MG TABS Take 600 mg by mouth 2 (two) times daily.   . calcium-vitamin D (OSCAL WITH D) 500-200 MG-UNIT tablet Take 1  tablet by mouth.  Arne Cleveland 5 MG TABS tablet TAKE 1 TABLET BY MOUTH TWICE A DAY  . fluticasone (FLONASE) 50 MCG/ACT nasal spray Place 2 sprays into both nostrils daily. (Patient taking differently: Place 2 sprays into both nostrils daily as needed for allergies. )  . furosemide (LASIX) 40 MG tablet TAKE 1 TABLET (40 MG TOTAL) BY MOUTH DAILY.  [name] glucose blood test strip as directed.  . hydrochlorothiazide (HYDRODIURIL) 25 MG tablet TAKE 1 TABLET BY MOUTH EVERY DAY WITH LOSARTAN.  [name] Insulin NPH Isophane & Regular (NOVOLIN 70/30 FLEXPEN [place]) Inject 24-30 Units into the skin See admin instructions. Inject 30 units every morning  and 24 units at bedtime  . Insulin Pen Needle (PEN NEEDLES) 31G X 6 MM MISC 1 Units  by Does not apply route daily.  [name] levothyroxine (SYNTHROID) 137 MCG tablet TAKE 1 TABLET BY MOUTH EVERY DAY BEFORE BREAKFAST  . losartan (COZAAR) 100 MG tablet TAKE 1 TABLET BY MOUTH EVERY DAY WITH HYDROCHLORTHIAZIDE  . metFORMIN (GLUCOPHAGE-XR) 500 MG 24 hr tablet Take 2,000 mg by mouth every evening.   . miconazole (MICOTIN) 2 % powder Apply topically as needed for itching.  . Multiple Vitamin (MULTIVITAMIN) tablet Take 1 tablet by mouth daily.   . Omega-3 Fatty Acids (FISH OIL PO) Take 1 tablet by mouth daily.   . potassium chloride SA (KLOR-CON M20) 20 MEQ tablet Take 1 tablet (20 mEq total) by mouth daily.  [name] PROAIR HFA 108 (90 Base) MCG/ACT inhaler INHALE 2 PUFFS EVERY 4 HOURS AS NEEDED. (Patient taking differently: Inhale 2 puffs into the lungs every 4 (four) hours as needed for wheezing. )  . rosuvastatin (CRESTOR) 10 MG tablet Take 1 tablet (10 mg total) by mouth daily.  . traMADol (ULTRAM) 50 MG tablet Take 0.5 tablets (25 mg total) by mouth daily as needed.  . triamcinolone cream (KENALOG) 0.1 % Apply 1 application topically 2 (two) times daily as needed.  . [DISCONTINUED] TRELEGY ELLIPTA 100-62.5-25 MCG/INH AEPB INHALE 1 PUFF BY MOUTH INTO THE LUNGS ONCE DAILY     Allergies:   Advil [ibuprofen], Janumet [sitagliptin-metformin hcl], Amoxicillin, and Sulfonamide derivatives   Social History   Socioeconomic History  . Marital status: Widowed    Spouse name: Not on file  . Number of children: 1  . Years of education: Not on file  . Highest education level: Not on file  Occupational History    Comment: Part-Time at [employer]  Tobacco Use  . Smoking status: Former Smoker    Packs/day: 1.00    Years: 20.00    Pack years: 20.00    Types: Cigarettes    Quit date: 05/05/1996    Years since quitting: 23.9  . Smokeless tobacco: Never Used  Substance and Sexual Activity  . Alcohol use: No    Alcohol/week: 0.0 standard drinks  . Drug use: No  . Sexual activity: Never    Other Topics Concern  . Not on file  Social History Narrative  . Not on file   Social Determinants of Health   Financial Resource Strain:   . Difficulty of Paying Living Expenses:   Food Insecurity:   . Worried About Charity fundraiser in the Last Year:   . Arboriculturist in the Last Year:   Transportation Needs:   . Film/video editor (Medical):   [name] Lack of Transportation (Non-Medical):   Physical Activity:   . Days of Exercise per Week:   . Minutes  of Exercise per Session:   Stress:   . Feeling of Stress :   Social Connections:   . Frequency of Communication with Friends and Family:   . Frequency of Social Gatherings with Friends and Family:   . Attends Religious Services:   . Active Member of Clubs or Organizations:   . Attends Archivist Meetings:   [name] Marital Status:      Family History: The patient's family history includes Arrhythmia in her mother; Diabetes in her brother; Heart attack in her father; Heart disease in her brother; Heart failure in her mother; Stroke in her mother.  ROS:   Please see the history of present illness.     All other systems reviewed and are negative.  EKGs/Labs/Other Studies Reviewed:    The following studies were reviewed today:  Echo 03/12/2014 LV EF: 60% -  65%  Study Conclusions   - Left ventricle: The cavity size was normal. Wall thickness  was increased in a pattern of mild LVH. Systolic function  was normal. The estimated ejection fraction was in the  range of 60% to 65%. Wall motion was normal; there were no  regional wall motion abnormalities. Features are  consistent with a pseudonormal left ventricular filling  pattern, with concomitant abnormal relaxation and  increased filling pressure (grade 2 diastolic  dysfunction).  - Aortic valve: There was no stenosis.  - Mitral valve: Mildly calcified annulus. Mild  regurgitation.  - Left atrium: The atrium was moderately dilated.  - Right  ventricle: The cavity size was normal. Systolic  function was normal.  - Right atrium: The atrium was moderately dilated.  - Tricuspid valve: Peak RV-RA gradient: 68mm Hg (S).  - Pulmonary arteries: PA peak pressure: 70mm Hg (S).  - Systemic veins: IVC measured 2.2 cm with normal  respirophasic variation, suggesting RA pressure 8 mmHg.  Impressions:   - Normal LV size and systolic function, EF 44-01%. Mild LV  hypertrophy. Normal RV size and systolic function. No  significant valvular abnormalities. Moderate biatrial  enlargement.    EKG:  EKG is ordered today.  The ekg ordered today demonstrates atrial fibrillation, incomplete right bundle branch block.  Heart rate 87  Recent Labs: 10/15/2019: BUN 23; Creatinine 0.9; Potassium 4.0; Sodium 143; TSH 3.02  Recent Lipid Panel    Component Value Date/Time   CHOL 163 10/15/2019 0000   TRIG 90 10/15/2019 0000   HDL 68 10/15/2019 0000   CHOLHDL 3 04/10/2019 0959   VLDL 21.6 04/10/2019 0959   LDLCALC 79 10/15/2019 0000   LDLDIRECT 165.6 12/16/2011 0942    Physical Exam:    VS:  BP 130/72   Pulse 87   Temp (!) 97 F (36.1 C)   Ht 5\' 3"  (1.6 m)   Wt 192 lb 6.4 oz (87.3 kg)   SpO2 96%   BMI 34.08 kg/m     Wt Readings from Last 3 Encounters:  04/23/20 192 lb 6.4 oz (87.3 kg)  10/11/19 194 lb 9.6 oz (88.3 kg)  07/16/19 193 lb 6.4 oz (87.7 kg)     GEN:  Well nourished, well developed in no acute distress HEENT: Normal NECK: No JVD; No carotid bruits LYMPHATICS: No lymphadenopathy CARDIAC: Irregularly irregular, no murmurs, rubs, gallops RESPIRATORY: Diminished breath sounds in bilateral bases ABDOMEN: Soft, non-tender, non-distended MUSCULOSKELETAL:  No edema; No deformity  SKIN: Warm and dry NEUROLOGIC:  Alert and oriented x 3 PSYCHIATRIC:  Normal affect   ASSESSMENT:    1. DOE (dyspnea  on exertion)   2. Longstanding persistent atrial fibrillation ([place])   3. Essential hypertension   4. Controlled type 2  diabetes mellitus without complication, without long-term current use of insulin ([place])   5. Chronic obstructive pulmonary disease, unspecified COPD type ([place])    PLAN:    In order of problems listed above:  1. Dyspnea on exertion: I suspect her increasing dyspnea on exertion recently is more likely to be related to pulmonary issue.  I did recommend a echocardiogram since her previous echo was back in 2015 to make sure her ejection fraction is still normal and her mitral regurgitation has not progressed  2. Longstanding persistent atrial fibrillation: Continue Eliquis.  Self rate controlled  3. Hypertension: Blood pressure stable  4. DM2: Managed by primary care provider  5. COPD: Followed by Dr. Annamaria Boots   Medication Adjustments/Labs and Tests Ordered: Current medicines are reviewed at length with the patient today.  Concerns regarding medicines are outlined above.  Orders Placed This Encounter  Procedures  . EKG 12-Lead  . ECHOCARDIOGRAM COMPLETE   No orders of the defined types were placed in this encounter.   Patient Instructions  Medication Instructions:  The current medical regimen is effective;  continue present plan and medications as directed. Please refer to the Current Medication list given to you today. *If you need a refill on your cardiac medications before your next appointment, please call your pharmacy*  Testing/Procedures: Echocardiogram - Your physician has requested that you have an echocardiogram. Echocardiography is a painless test that uses sound waves to create images of your heart. It provides your doctor with information about the size and shape of your heart and how well your heart's chambers and valves are working. This procedure takes approximately one hour. There are no restrictions for this procedure. This will be performed at our [facility] location - [address].  Follow-Up: Your next appointment:  1 month(s)  In Person with Sanda Klein, MD  At [facility], you and your health needs are our priority.  As part of our continuing mission to provide you with exceptional heart care, we have created designated Provider Care Teams.  These Care Teams include your primary Cardiologist (physician) and Advanced Practice Providers (APPs -  Physician Assistants and Nurse Practitioners) who all work together to provide you with the care you need, when you need it.  We recommend signing up for the patient portal called "MyChart".  Sign up information is provided on this After Visit Summary.  MyChart is used to connect with patients for Virtual Visits (Telemedicine).  Patients are able to view lab/test results, encounter notes, upcoming appointments, etc.  Non-urgent messages can be sent to your provider as well.   To learn more about what you can do with MyChart, go to [email or web address].       Hilbert Corrigan, [place]  04/25/2020 11:50 PM    [facility]

## 2020-04-25 ENCOUNTER — Encounter: Payer: Self-pay | Admitting: Physician Assistant

## 2020-04-27 ENCOUNTER — Other Ambulatory Visit: Payer: Self-pay | Admitting: Cardiovascular Disease

## 2020-04-28 NOTE — Progress Notes (Signed)
Agree w echo. Thanks

## 2020-04-30 DIAGNOSIS — L821 Other seborrheic keratosis: Secondary | ICD-10-CM | POA: Diagnosis not present

## 2020-04-30 DIAGNOSIS — D485 Neoplasm of uncertain behavior of skin: Secondary | ICD-10-CM | POA: Diagnosis not present

## 2020-05-01 ENCOUNTER — Other Ambulatory Visit: Payer: Self-pay

## 2020-05-01 ENCOUNTER — Encounter: Payer: Self-pay | Admitting: Primary Care

## 2020-05-01 ENCOUNTER — Ambulatory Visit: Payer: Medicare Other | Admitting: Primary Care

## 2020-05-01 ENCOUNTER — Telehealth: Payer: Self-pay | Admitting: Primary Care

## 2020-05-01 VITALS — BP 134/78 | HR 74 | Temp 98.4°F | Ht 62.5 in | Wt 189.8 lb

## 2020-05-01 DIAGNOSIS — R600 Localized edema: Secondary | ICD-10-CM | POA: Diagnosis not present

## 2020-05-01 DIAGNOSIS — J449 Chronic obstructive pulmonary disease, unspecified: Secondary | ICD-10-CM | POA: Diagnosis not present

## 2020-05-01 DIAGNOSIS — R0602 Shortness of breath: Secondary | ICD-10-CM

## 2020-05-01 LAB — BRAIN NATRIURETIC PEPTIDE: Pro B Natriuretic peptide (BNP): 77 pg/mL (ref 0.0–100.0)

## 2020-05-01 MED ORDER — TRELEGY ELLIPTA 100-62.5-25 MCG/INH IN AEPB: 1.0000 | INHALATION_SPRAY | Freq: Every day | RESPIRATORY_TRACT | 0 refills | Status: AC

## 2020-05-01 NOTE — Assessment & Plan Note (Addendum)
-   Resolved - Checking BNP - Scheduled for echocardiogram with cardiology in July

## 2020-05-01 NOTE — Telephone Encounter (Signed)
Called and spoke with pt to see why she was denied patient assistance. Pt stated she came in about $2,000-$3,000 over what was approved.

## 2020-05-01 NOTE — Telephone Encounter (Signed)
Did she state the reason why she was denied? (Income or because she did not meet the prescription out of pocket requirement)  Thanks!

## 2020-05-01 NOTE — Assessment & Plan Note (Signed)
-   COPD GOLD III with asthma. FEV1 1.05 (54%), ratio 54 - Reports improvement in COPD symptoms on Trelegy, previously failed Breztri. She will not be able to afford medication once she goes into the donut hole. Not currently on maintenance inhaler. Using SABA twice a day.  - Resume Trelegy 1 puff daily in the morning (sample given) - We will discuss with office pharmacist, may need to consider changing to nebulized medication such as budesonide and Duoneb QID to help with cost

## 2020-05-01 NOTE — Telephone Encounter (Signed)
Denied d/t income

## 2020-05-01 NOTE — Progress Notes (Signed)
Please let patient know her BNP was normal

## 2020-05-01 NOTE — Patient Instructions (Addendum)
Recommendations: - Resume Trelegy 1 puff daily in the morning (rinse mouth after use) - We will see if there are any other options with our pharmacist- may need to try something difference such as Symbicort + Spiriva or nebulized medications to make it more affordable for you   Orders: - BNP today - Have echocardiogram done per cardiology  Follow-up: 1 year with Dr. Annamaria Boots or sooner if symptoms worsen

## 2020-05-01 NOTE — Progress Notes (Signed)
@Patient  ID: Renee Ray, female    DOB: 12-20-1940, 79 y.o.   MRN: [id number]  Chief Complaint  Patient presents with  . Follow-up    doing well, edema in BLE a month ago, DOE at times    Referring provider: Flossie Buffy, NP  HPI:  79 year old female, former smoker quit in 1997(20 pack year history). PMH COPD mixed type, asthmatic bronchitis, cardiomyopathy, HTN, afib (on Eliquis), DM 2, hypothyroidism. Patient of Dr. Annamaria Boots last seen on 04/04/18. Not on LAMA d/t ? borderline glaucoma.   Previous [facility] pulmonary encounters: 10/03/2018 Patient presents today with increased shortness of breath on exertion over the last 1 month. She is not current on maintaince inhaler, has used Breo in the past. Uses Proair hfa as needed. States that she took 5mg  dose of prednisone that she has on hand with noticeable improvement in her breathing. Associated sinus drainage, mucus is clear. Feels back pain is from her arthritis. States that she has gained 4 pounds in the last 6 months. No recent weight gain or increased swelling. She is not aware of having diagnosis of glaucoma, believes she was told once that she had the beginning of cataracts. Denies fever or cough. She was started on Bevespi 2 puffs twice daily for worsening dyspnea over the past month.  11/13/2018: Patient presents today for 4-6 week follow-up. States that it's hard to tell if Bevespi helped. She had sinus and chest congestion recently. She treated her self with mucinex and tesslone perles. Feelings better. She is diabetic, occasionally takes 1 tab of prednisone if needed and that helps. Still short of breath, learned to do pursed lip breathing   05/01/2019- 79 year old female former smoker followed for asthmatic bronchitis, complicated by DM, CM, atrial fibrillation, osteoarthritis Widow of Dr. Herbie [place] Such/neurosurgeon Failed Charolotte Eke and given Trelegy trial at last ov w NP. -----pt states breathing at baseline, states Trelegy is  working well for her, is requesting patient assistance for it. No exacerbations, infrequent need for rescue inhaler. Arthritis problems continue but she is still working part time at [employer]. CXR 04/04/18-  IMPRESSION: COPD. Mild cardiomegaly without pulmonary edema. Thoracic aortic atherosclerosis.   05/01/2020- Interim hx Patient presents today for annual follow-up. She has some swelling in her ankles last month. Her feet felt uncom. This resolved on its own. She monitors her sodium intake. She takes lasix every as well as HCTZ for her blood pressure. She is scheduled for echocardiogram gram next week. She saw a dermatologist yesterday for new skin lesion on her left neck. Plan to go back in 6 weeks for biopsy. She uses her Albuterol twice a day in the morning and evening. Patient assistance for Trelegy was denied. Copay is 45 dollars. She will be in the donut hole in 1 month. She did report benefit from use.    Testing: Office Spirometry 04/04/2018-moderately severe obstruction.  FVC 1.96/75%, FEV1 1.05/54%, ratio 0.54, FEF 25-75% 0.43/29%.  Allergies  Allergen Reactions  . Advil [Ibuprofen] Shortness Of Breath    Asthma  . Janumet [Sitagliptin-Metformin Hcl] Swelling  . Amoxicillin Diarrhea  . Sulfonamide Derivatives Other (See Comments)    REACTION: thrush    Immunization History  Administered Date(s) Administered  . Fluad Quad(high Dose 65+) 08/09/2019  . Influenza Split 08/08/2011, 08/09/2012, 07/08/2013, 08/22/2015, 08/07/2017  . Influenza Whole 08/10/2010  . Influenza, High Dose Seasonal PF 08/17/2016, 08/11/2018  . Influenza,inj,Quad PF,6+ Mos 08/12/2014  . Influenza,inj,quad, With Preservative 08/15/2019  . [place] SARS-COVID-2 Vaccination  11/10/2019, 12/13/2019  . Pneumococcal Conjugate-13 09/05/2013  . Pneumococcal Polysaccharide-23 11/08/2007  . Zoster Recombinat (Shingrix) 04/20/2017, 07/21/2017    Past Medical History:  Diagnosis Date  . Abnormal  echocardiogram 2010   showing slightly enlarged left atrium with normal LV function and normal PA pressures.   . Acute medial meniscal injury of knee    RIGHT KNEE  . Asthmatic bronchitis , chronic ([place])   . Atrial fibrillation ([place])   . Bruise    RIGHT ABD. DUE TO LOVENOX INJECTION  02-27-2012  . Chronic anticoagulation    coumadin  . Chronic atrial fibrillation ([place])    CARDIOLOGIST- DR BRACKBILL-- LAST VISIT  12-16-2011 IN [facility]  . Chronic renal insufficiency   . COPD, moderate ([place])    PULMOLOGIST- DR YOUNG --  LAST VISIT NOTE 02-23-2012 IN [facility]  . Diabetes mellitus    INSULIN AND ORAL MEDS  . Goiter 12/15   will have radiation in February  . History of pneumonia    associated w/  rhabdomyolysis  OCT 2012  . Hyperlipidemia   . Hypertension   . Non-ischemic cardiomyopathy ([place])    normal ef  . Normal nuclear stress test 2008  . OA (osteoarthritis) of knee    HANDS    Tobacco History: Social History   Tobacco Use  Smoking Status Former Smoker  . Packs/day: 1.00  . Years: 20.00  . Pack years: 20.00  . Types: Cigarettes  . Quit date: 05/05/1996  . Years since quitting: 24.0  Smokeless Tobacco Never Used   Counseling given: Not Answered   Outpatient Medications Prior to Visit  Medication Sig Dispense Refill  . Ascorbic Acid (VITAMIN C PO) Take by mouth.    [name] b complex vitamins capsule Take 1 capsule by mouth daily.     . calcium carbonate (OS-CAL) 600 MG TABS Take 600 mg by mouth 2 (two) times daily.     . calcium-vitamin D (OSCAL WITH D) 500-200 MG-UNIT tablet Take 1 tablet by mouth.    Arne Cleveland 5 MG TABS tablet TAKE 1 TABLET BY MOUTH TWICE A DAY 60 tablet 5  . fluticasone (FLONASE) 50 MCG/ACT nasal spray Place 2 sprays into both nostrils daily. (Patient taking differently: Place 2 sprays into both nostrils daily as needed for allergies. ) 16 g 0  . furosemide (LASIX) 40 MG tablet TAKE 1 TABLET (40 MG TOTAL) BY MOUTH DAILY. 90 tablet 3  . glucose blood test strip  as directed.    . hydrochlorothiazide (HYDRODIURIL) 25 MG tablet TAKE 1 TABLET BY MOUTH EVERY DAY WITH LOSARTAN 30 tablet 5  . Insulin NPH Isophane & Regular (NOVOLIN 70/30 FLEXPEN [place]) Inject 24-30 Units into the skin See admin instructions. Inject 30 units every morning  and 24 units at bedtime    . Insulin Pen Needle (PEN NEEDLES) 31G X 6 MM MISC 1 Units by Does not apply route daily. 100 each 3  . KLOR-CON M20 20 MEQ tablet TAKE 1 TABLET BY MOUTH EVERY DAY 30 tablet 5  . levothyroxine (SYNTHROID) 137 MCG tablet TAKE 1 TABLET BY MOUTH EVERY DAY BEFORE BREAKFAST 90 tablet 1  . losartan (COZAAR) 100 MG tablet TAKE 1 TABLET BY MOUTH EVERY DAY WITH HYDROCHLORTHIAZIDE 90 tablet 1  . metFORMIN (GLUCOPHAGE-XR) 500 MG 24 hr tablet Take 2,000 mg by mouth every evening.     . miconazole (MICOTIN) 2 % powder Apply topically as needed for itching. 70 g 0  . Multiple Vitamin (MULTIVITAMIN) tablet Take 1 tablet  by mouth daily.     . Omega-3 Fatty Acids (FISH OIL PO) Take 1 tablet by mouth daily.     [name] PROAIR HFA 108 (90 Base) MCG/ACT inhaler INHALE 2 PUFFS EVERY 4 HOURS AS NEEDED. (Patient taking differently: Inhale 2 puffs into the lungs every 4 (four) hours as needed for wheezing. ) 8.5 Inhaler 4  . rosuvastatin (CRESTOR) 10 MG tablet Take 1 tablet (10 mg total) by mouth daily. 30 tablet 0  . traMADol (ULTRAM) 50 MG tablet Take 0.5 tablets (25 mg total) by mouth daily as needed. 30 tablet 0  . triamcinolone cream (KENALOG) 0.1 % Apply 1 application topically 2 (two) times daily as needed. 80 g 1   No facility-administered medications prior to visit.   Review of Systems  Review of Systems  Constitutional: Negative.   Respiratory: Positive for shortness of breath. Negative for cough and wheezing.   Cardiovascular: Positive for leg swelling.   Physical Exam  BP 134/78 (BP Location: Right Arm, Cuff Size: Normal)   Pulse 74   Temp 98.4 F (36.9 C) (Oral)   Ht 5' 2.5" (1.588 m)   Wt 189 lb 12.8 oz  (86.1 kg)   SpO2 97%   BMI 34.16 kg/m  Physical Exam Constitutional:      Appearance: Normal appearance.  HENT:     Head: Normocephalic and atraumatic.     Mouth/Throat:     Mouth: Mucous membranes are moist.     Pharynx: Oropharynx is clear.  Cardiovascular:     Rate and Rhythm: Normal rate and regular rhythm.     Comments: Regularly irregular Pulmonary:     Effort: Pulmonary effort is normal.     Breath sounds: Normal breath sounds. No wheezing or rhonchi.     Comments: CTA Skin:    General: Skin is warm and dry.  Neurological:     General: No focal deficit present.     Mental Status: She is alert and oriented to person, place, and time. Mental status is at baseline.  Psychiatric:        Mood and Affect: Mood normal.        Behavior: Behavior normal.        Thought Content: Thought content normal.        Judgment: Judgment normal.      Lab Results:  CBC    Component Value Date/Time   WBC 6.5 04/10/2019 0959   RBC 4.40 04/10/2019 0959   HGB 12.3 04/10/2019 0959   HCT 37.6 04/10/2019 0959   PLT 398.0 04/10/2019 0959   MCV 85.5 04/10/2019 0959   MCV 71.8 (A) 08/13/2014 1538   MCH 27.4 03/25/2019 0152   MCHC 32.6 04/10/2019 0959   RDW 16.0 (H) 04/10/2019 0959   LYMPHSABS 0.9 10/09/2018 1354   MONOABS 0.5 10/09/2018 1354   EOSABS 0.0 10/09/2018 1354   BASOSABS 0.0 10/09/2018 1354    BMET    Component Value Date/Time   NA 143 10/15/2019 0000   K 4.0 10/15/2019 0000   CL 103 10/15/2019 0000   CO2 30 (A) 10/15/2019 0000   GLUCOSE 109 (H) 03/25/2019 0152   BUN 23 (A) 10/15/2019 0000   CREATININE 0.9 10/15/2019 0000   CREATININE 1.05 (H) 03/25/2019 0152   CREATININE 0.82 12/02/2015 1056   CALCIUM 9.9 10/15/2019 0000   GFRNONAA 64 10/15/2019 0000   GFRAA 77 10/15/2019 0000    BNP No results found for: BNP  ProBNP    Component Value Date/Time  PROBNP 4619.0 (H) 08/09/2011 1800    Imaging: No results found.   Assessment & Plan:   COPD mixed  type ([place]) - COPD GOLD III with asthma. FEV1 1.05 (54%), ratio 54 - Reports improvement in COPD symptoms on Trelegy, previously failed Breztri. She will not be able to afford medication once she goes into the donut hole. Not currently on maintenance inhaler. Using SABA twice a day.  - Resume Trelegy 1 puff daily in the morning (sample given) - We will discuss with office pharmacist, may need to consider changing to nebulized medication such as budesonide and Duoneb QID to help with cost    Lower extremity edema - Resolved - Checking BNP - Scheduled for echocardiogram with cardiology in July   FU annually with Dr. Annamaria Boots or APP  Martyn Ehrich, NP 05/01/2020

## 2020-05-01 NOTE — Telephone Encounter (Signed)
Hx severe COPD, afib. Patient is on Eliquis and Trelegy. She will be in the donut hole in 1 month. She reports improvement in her breathing on Trelegy but can not afford it when she goes in the donut hole. Patient assistance was denied. Anything else you suggest?

## 2020-05-01 NOTE — Telephone Encounter (Signed)
Can you find out reason patient was denied patient assistance? Not urgent

## 2020-05-04 MED ORDER — BREZTRI AEROSPHERE 160-9-4.8 MCG/ACT IN AERO: 2.0000 | INHALATION_SPRAY | Freq: Two times a day (BID) | RESPIRATORY_TRACT | 0 refills | Status: DC

## 2020-05-04 MED ORDER — BREZTRI AEROSPHERE 160-9-4.8 MCG/ACT IN AERO: 2.0000 | INHALATION_SPRAY | Freq: Two times a day (BID) | RESPIRATORY_TRACT | 3 refills | Status: DC

## 2020-05-04 NOTE — Telephone Encounter (Signed)
Thank you :)

## 2020-05-04 NOTE — Telephone Encounter (Signed)
Please give patient a sample of Brerztri inhaler 2 puffs BID to start after she finishes Trelegy; AND please help her with patient assistance for this medication.

## 2020-05-04 NOTE — Telephone Encounter (Signed)
FYI Beth  

## 2020-05-04 NOTE — Telephone Encounter (Signed)
I called pt and advised her of message from [place]. She agreed and I advised her that I would place samples up front for her to pick up. I downloaded an application from [employer] website and attached the paperwork to the sample bag and advised pt to fill out application and return back to [place]. I printed the [facility] Rx and the provider part of the application and placed it in Beth;s folder to sign. Will await return copies from pt's part of the application and then proceed with financial assistance process.

## 2020-05-04 NOTE — Telephone Encounter (Signed)
[employer] has an higher income eligibility for Medicare patients, up to $51,000/year for household of 2. ([place] is $43,000/year for a household of 2). Patient could apply for [facility] through [place]

## 2020-05-05 ENCOUNTER — Ambulatory Visit: Payer: Medicare Other | Admitting: Internal Medicine

## 2020-05-07 DIAGNOSIS — E1142 Type 2 diabetes mellitus with diabetic polyneuropathy: Secondary | ICD-10-CM | POA: Diagnosis not present

## 2020-05-07 DIAGNOSIS — E0520 Thyrotoxicosis with toxic multinodular goiter without thyrotoxic crisis or storm: Secondary | ICD-10-CM | POA: Diagnosis not present

## 2020-05-07 DIAGNOSIS — E1165 Type 2 diabetes mellitus with hyperglycemia: Secondary | ICD-10-CM | POA: Diagnosis not present

## 2020-05-07 DIAGNOSIS — Z794 Long term (current) use of insulin: Secondary | ICD-10-CM | POA: Diagnosis not present

## 2020-05-07 DIAGNOSIS — E890 Postprocedural hypothyroidism: Secondary | ICD-10-CM | POA: Diagnosis not present

## 2020-05-20 ENCOUNTER — Ambulatory Visit (HOSPITAL_COMMUNITY): Payer: Medicare Other | Attending: Cardiology

## 2020-05-20 ENCOUNTER — Other Ambulatory Visit: Payer: Self-pay

## 2020-05-20 DIAGNOSIS — R0600 Dyspnea, unspecified: Secondary | ICD-10-CM | POA: Diagnosis not present

## 2020-05-20 DIAGNOSIS — R0609 Other forms of dyspnea: Secondary | ICD-10-CM

## 2020-05-28 ENCOUNTER — Encounter: Payer: Self-pay | Admitting: Podiatry

## 2020-05-28 ENCOUNTER — Other Ambulatory Visit: Payer: Self-pay

## 2020-05-28 ENCOUNTER — Ambulatory Visit: Payer: Medicare Other | Admitting: Podiatry

## 2020-05-28 DIAGNOSIS — D689 Coagulation defect, unspecified: Secondary | ICD-10-CM | POA: Diagnosis not present

## 2020-05-28 DIAGNOSIS — Q828 Other specified congenital malformations of skin: Secondary | ICD-10-CM

## 2020-05-28 NOTE — Progress Notes (Signed)
She presents today chief complaint of a painful corn to the fifth digit of the right foot.  Objective: Reactive hyperkeratotic lesion medial aspect fifth digit right foot secondary to adductovarus rotated hammertoe deformity.  No erythema edema cellulitis drainage or odor.  Assessment: Reactive hyperkeratotic lesion fifth right.  Plan: Debridement of reactive hyperkeratotic tissue placed padding.  Follow-up with me in 1 month

## 2020-06-05 ENCOUNTER — Other Ambulatory Visit: Payer: Self-pay | Admitting: Cardiovascular Disease

## 2020-06-11 ENCOUNTER — Ambulatory Visit: Payer: Medicare Other | Admitting: Cardiovascular Disease

## 2020-06-11 DIAGNOSIS — L905 Scar conditions and fibrosis of skin: Secondary | ICD-10-CM | POA: Diagnosis not present

## 2020-06-25 ENCOUNTER — Telehealth: Payer: Self-pay

## 2020-06-25 NOTE — Telephone Encounter (Signed)
Patient called in today with questions regarding getting there 3rd Moderna Covid vaccine and if this was something she needed to get. Patient was informed per Baldo Ash that per the CDC the booster was for Immunocompromised people and provided a list of things which was not criteria that the patient fitted. 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 I informed the patient that was her decision and I would make [facility] aware.

## 2020-07-01 ENCOUNTER — Ambulatory Visit: Payer: Medicare Other | Admitting: Internal Medicine

## 2020-07-08 ENCOUNTER — Other Ambulatory Visit: Payer: Self-pay | Admitting: Nurse Practitioner

## 2020-07-08 DIAGNOSIS — E039 Hypothyroidism, unspecified: Secondary | ICD-10-CM

## 2020-07-09 ENCOUNTER — Ambulatory Visit: Payer: Medicare Other | Admitting: Podiatry

## 2020-07-12 ENCOUNTER — Other Ambulatory Visit: Payer: Self-pay | Admitting: Nurse Practitioner

## 2020-07-12 DIAGNOSIS — B372 Candidiasis of skin and nail: Secondary | ICD-10-CM

## 2020-07-31 ENCOUNTER — Other Ambulatory Visit: Payer: Self-pay

## 2020-07-31 ENCOUNTER — Ambulatory Visit: Payer: Medicare Other | Admitting: Cardiovascular Disease

## 2020-07-31 ENCOUNTER — Encounter: Payer: Self-pay | Admitting: Cardiovascular Disease

## 2020-07-31 VITALS — BP 148/70 | HR 82 | Ht 62.0 in | Wt 192.8 lb

## 2020-07-31 DIAGNOSIS — E1169 Type 2 diabetes mellitus with other specified complication: Secondary | ICD-10-CM

## 2020-07-31 DIAGNOSIS — E669 Obesity, unspecified: Secondary | ICD-10-CM

## 2020-07-31 DIAGNOSIS — I4811 Longstanding persistent atrial fibrillation: Secondary | ICD-10-CM

## 2020-07-31 DIAGNOSIS — I10 Essential (primary) hypertension: Secondary | ICD-10-CM | POA: Diagnosis not present

## 2020-07-31 DIAGNOSIS — E7800 Pure hypercholesterolemia, unspecified: Secondary | ICD-10-CM

## 2020-07-31 DIAGNOSIS — Z7901 Long term (current) use of anticoagulants: Secondary | ICD-10-CM | POA: Diagnosis not present

## 2020-07-31 NOTE — Progress Notes (Signed)
Cardiology Office Note    Date:  07/31/2020   ID:  Rennie, Hack 01/22/1941, MRN [id number]  PCP:  Flossie Buffy, NP  Cardiologist:  Maitlyn Penza Electrophysiologist:  None   Evaluation Performed:  Follow-Up Visit  Chief Complaint:  AFib  History of Present Illness:    Renee Ray is a 79 y.o. female with long-standing persistent atrial fibrillation, essential hypertension with LVH, history of iron deficiency anemia, mild obesity with type 2 diabetes mellitus on metformin therapy, COPD (moderate by most recent tests).  She feels well. Her breathing issues during the summer have improved with the change in weather. She will be celebrating her 79th birthday next week.  The patient specifically denies any chest pain at rest exertion, dyspnea at rest or with exertion, orthopnea, paroxysmal nocturnal dyspnea, syncope, palpitations, focal neurological deficits, intermittent claudication, lower extremity edema, unexplained weight gain, cough, hemoptysis or wheezing. She has not had falls, injuries or bleeding problems.  Echo in June showed moderate biatrial enlargement, normal LVEF. Her most recent A1c was 7.7% and LDL was better at 79. SBP is slightly high today, typically 130 or less.    Past Medical History:  Diagnosis Date  . Abnormal echocardiogram 2010   showing slightly enlarged left atrium with normal LV function and normal PA pressures.   . Acute medial meniscal injury of knee    RIGHT KNEE  . Asthmatic bronchitis , chronic ([place])   . Atrial fibrillation ([place])   . Bruise    RIGHT ABD. DUE TO LOVENOX INJECTION  02-27-2012  . Chronic anticoagulation    coumadin  . Chronic atrial fibrillation ([place])    CARDIOLOGIST- DR BRACKBILL-- LAST VISIT  12-16-2011 IN [facility]  . Chronic renal insufficiency   . COPD, moderate ([place])    PULMOLOGIST- DR YOUNG --  LAST VISIT NOTE 02-23-2012 IN [facility]  . Diabetes mellitus    INSULIN AND ORAL MEDS  . Goiter 12/15   will have  radiation in February  . History of pneumonia    associated w/  rhabdomyolysis  OCT 2012  . Hyperlipidemia   . Hypertension   . Non-ischemic cardiomyopathy ([place])    normal ef  . Normal nuclear stress test 2008  . OA (osteoarthritis) of knee    HANDS   Past Surgical History:  Procedure Laterality Date  . [place]--- BREAST IMPLANTS REMOVED   . KNEE ARTHROSCOPY  03/02/2012   Procedure: ARTHROSCOPY KNEE;  Surgeon: Gearlean Alf, MD;  Location[facility];  Service: Orthopedics;  Laterality: Right;  WITH DEBRIDEMENT of medial menisces  . VAGINAL HYSTERECTOMY  1978     Current Meds  Medication Sig  . Ascorbic Acid (VITAMIN C PO) Take by mouth.  [name] b complex vitamins capsule Take 1 capsule by mouth daily.   . calcium carbonate (OS-CAL) 600 MG TABS Take 600 mg by mouth 2 (two) times daily.   . calcium-vitamin D (OSCAL WITH D) 500-200 MG-UNIT tablet Take 1 tablet by mouth.  Arne Cleveland 5 MG TABS tablet TAKE 1 TABLET BY MOUTH TWICE A DAY  . fluticasone (FLONASE) 50 MCG/ACT nasal spray Place 2 sprays into both nostrils daily. (Patient taking differently: Place 2 sprays into both nostrils daily as needed for allergies. )  . Fluticasone-Umeclidin-Vilant (TRELEGY ELLIPTA) 100-62.5-25 MCG/INH AEPB Inhale 1 puff into the lungs daily.  . furosemide (LASIX) 40 MG tablet TAKE 1 TABLET (40 MG TOTAL) BY MOUTH DAILY.  [name] glucose  blood test strip as directed.  . hydrochlorothiazide (HYDRODIURIL) 25 MG tablet TAKE 1 TABLET BY MOUTH EVERY DAY WITH LOSARTAN  . Insulin NPH Isophane & Regular (NOVOLIN 70/30 FLEXPEN [place]) Inject 24-30 Units into the skin See admin instructions. Inject 30 units every morning  and 24 units at bedtime  . Insulin Pen Needle (PEN NEEDLES) 31G X 6 MM MISC 1 Units by Does not apply route daily.  [name] KLOR-CON M20 20 MEQ tablet TAKE 1 TABLET BY MOUTH EVERY DAY  . levothyroxine (SYNTHROID) 137 MCG tablet Take 1 tablet (137 mcg total) by mouth  daily before breakfast. Need office visit for additional refills  . losartan (COZAAR) 100 MG tablet TAKE 1 TABLET BY MOUTH EVERY DAY WITH HYDROCHLORTHIAZIDE  . metFORMIN (GLUCOPHAGE-XR) 500 MG 24 hr tablet Take 2,000 mg by mouth every evening.   . miconazole (MICOTIN) 2 % powder Apply topically as needed for itching.  . Multiple Vitamin (MULTIVITAMIN) tablet Take 1 tablet by mouth daily.   . Omega-3 Fatty Acids (FISH OIL PO) Take 1 tablet by mouth daily.   [name] PROAIR HFA 108 (90 Base) MCG/ACT inhaler INHALE 2 PUFFS EVERY 4 HOURS AS NEEDED. (Patient taking differently: Inhale 2 puffs into the lungs every 4 (four) hours as needed for wheezing. )  . rosuvastatin (CRESTOR) 10 MG tablet TAKE 1 TABLET BY MOUTH EVERY DAY  . traMADol (ULTRAM) 50 MG tablet Take 0.5 tablets (25 mg total) by mouth daily as needed.  . triamcinolone cream (KENALOG) 0.1 % APPLY TO AFFECTED AREA TWICE DAILY AS NEEDED     Allergies:   Advil [ibuprofen], Janumet [sitagliptin-metformin hcl], Amoxicillin, and Sulfonamide derivatives   Social History   Tobacco Use  . Smoking status: Former Smoker    Packs/day: 1.00    Years: 20.00    Pack years: 20.00    Types: Cigarettes    Quit date: 05/05/1996    Years since quitting: 24.2  . Smokeless tobacco: Never Used  Substance Use Topics  . Alcohol use: No    Alcohol/week: 0.0 standard drinks  . Drug use: No     Family Hx: The patient's family history includes Arrhythmia in her mother; Diabetes in her brother; Heart attack in her father; Heart disease in her brother; Heart failure in her mother; Stroke in her mother.  ROS:   Please see the history of present illness.    All other systems are reviewed and are negative.   Prior CV studies:   The following studies were reviewed today:  ECHO 05/20/2020 1. Left ventricular ejection fraction, by estimation, is 60 to 65%. The  left ventricle has normal function. The left ventricle has no regional  wall motion abnormalities.  Left ventricular diastolic parameters are  indeterminate.  2. Right ventricular systolic function is normal. The right ventricular  size is normal. There is normal pulmonary artery systolic pressure.  3. Left atrial size was mild to moderately dilated.  4. Right atrial size was moderately dilated.  5. The mitral valve is normal in structure. Mild mitral valve  regurgitation. No evidence of mitral stenosis.  6. Tricuspid valve regurgitation is moderate.  7. The aortic valve is normal in structure. Aortic valve regurgitation is  not visualized. No aortic stenosis is present.  8. The inferior vena cava is normal in size with greater than 50%  respiratory variability, suggesting right atrial pressure of 3 mmHg.   Labs/Other Tests and Data Reviewed:    EKG:  04/24/2020 showed atrial fibrillation with incomplete RBBB,  LAFB  Recent Labs: 10/15/2019: BUN 23; Creatinine 0.9; Potassium 4.0; Sodium 143; TSH 3.02 05/01/2020: Pro B Natriuretic peptide (BNP) 77.0   Recent Lipid Panel Lab Results  Component Value Date/Time   CHOL 163 10/15/2019 12:00 AM   TRIG 90 10/15/2019 12:00 AM   HDL 68 10/15/2019 12:00 AM   CHOLHDL 3 04/10/2019 09:59 AM   LDLCALC 79 10/15/2019 12:00 AM   LDLDIRECT 165.6 12/16/2011 09:42 AM    Wt Readings from Last 3 Encounters:  07/31/20 192 lb 12.8 oz (87.5 kg)  05/01/20 189 lb 12.8 oz (86.1 kg)  04/23/20 192 lb 6.4 oz (87.3 kg)     Objective:    Vital Signs:  BP (!) 148/70   Pulse 82   Ht 5\' 2"  (1.575 m)   Wt 192 lb 12.8 oz (87.5 kg)   SpO2 99%   BMI 35.26 kg/m    General: Alert, oriented x3, no distress, moderately obese Head: no evidence of trauma, PERRL, EOMI, no exophtalmos or lid lag, no myxedema, no xanthelasma; normal ears, nose and oropharynx Neck: normal jugular venous pulsations and no hepatojugular reflux; brisk carotid pulses without delay and no carotid bruits Chest: clear to auscultation, no signs of consolidation by percussion or  palpation, normal fremitus, symmetrical and full respiratory excursions Cardiovascular: normal position and quality of the apical impulse, irregular rhythm, normal first and second heart sounds, no murmurs, rubs or gallops Abdomen: no tenderness or distention, no masses by palpation, no abnormal pulsatility or arterial bruits, normal bowel sounds, no hepatosplenomegaly Extremities: no clubbing, cyanosis or edema; 2+ radial, ulnar and brachial pulses bilaterally; 2+ right femoral, posterior tibial and dorsalis pedis pulses; 2+ left femoral, posterior tibial and dorsalis pedis pulses; no subclavian or femoral bruits Neurological: grossly nonfocal Psych: Normal mood and affect   ASSESSMENT & PLAN:    1. Longstanding persistent atrial fibrillation ([place])   2. Long term current use of anticoagulant   3. Essential hypertension   4. Hypercholesterolemia   5. Diabetes mellitus type 2 in obese ([place])      1. AFib: spontaneously rate controlled,  on appropriate anticoagulation. CHADSVasc 5 (age 22, gender, hypertension, diabetes) 2. Anticoagulation: no bleeding problems. 3. HTN: usually controlled, meds not changed today.   4. HLP: on statin, lipids at goal 5. DM: A1c target <8%. Occasional nocturnal hypoglycemia. 6. Obesity: encouraged carb/calorie restriction.    COVID-19 Education: The signs and symptoms of COVID-19 were discussed with the patient and how to seek care for testing (follow up with PCP or arrange E-visit).  The importance of social distancing was discussed today.  Time:   Today, I have spent 15 minutes with the patient with telehealth technology discussing the above problems.     Medication Adjustments/Labs and Tests Ordered: Current medicines are reviewed at length with the patient today.  Concerns regarding medicines are outlined above.   Tests Ordered: No orders of the defined types were placed in this encounter.   Medication Changes: No orders of the defined types  were placed in this encounter.   Follow Up:  Virtual Visit or In Person 12 months  Signed, Sanda Klein, MD  07/31/2020 8:28 AM    [place]

## 2020-07-31 NOTE — Patient Instructions (Signed)
Medication Instructions:  NONE *If you need a refill on your cardiac medications before your next appointment, please call your pharmacy*   Lab Work: NONE If you have labs (blood work) drawn today and your tests are completely normal, you will receive your results only by: [name] MyChart Message (if you have MyChart) OR . A paper copy in the mail If you have any lab test that is abnormal or we need to change your treatment, we will call you to review the results.   Testing/Procedures: NONE   Follow-Up: At [facility], you and your health needs are our priority.  As part of our continuing mission to provide you with exceptional heart care, we have created designated Provider Care Teams.  These Care Teams include your primary Cardiologist (physician) and Advanced Practice Providers (APPs -  Physician Assistants and Nurse Practitioners) who all work together to provide you with the care you need, when you need it.  We recommend signing up for the patient portal called "MyChart".  Sign up information is provided on this After Visit Summary.  MyChart is used to connect with patients for Virtual Visits (Telemedicine).  Patients are able to view lab/test results, encounter notes, upcoming appointments, etc.  Non-urgent messages can be sent to your provider as well.   To learn more about what you can do with MyChart, go to [email or web address].    Your next appointment:   12 month(s)  The format for your next appointment:   In Person  Provider:   You may see Sanda Klein, MD or one of the following Advanced Practice Providers on your designated Care Team:    Almyra Deforest, PA-C  Fabian Sharp, [place] or   Roby Lofts, [place]    Other Instructions NONE

## 2020-08-05 ENCOUNTER — Other Ambulatory Visit: Payer: Self-pay | Admitting: Cardiovascular Disease

## 2020-08-06 ENCOUNTER — Encounter: Payer: Self-pay | Admitting: Podiatry

## 2020-08-06 ENCOUNTER — Other Ambulatory Visit: Payer: Self-pay

## 2020-08-06 ENCOUNTER — Ambulatory Visit (INDEPENDENT_AMBULATORY_CARE_PROVIDER_SITE_OTHER): Payer: Medicare Other | Admitting: Podiatry

## 2020-08-06 DIAGNOSIS — D689 Coagulation defect, unspecified: Secondary | ICD-10-CM

## 2020-08-06 DIAGNOSIS — Q828 Other specified congenital malformations of skin: Secondary | ICD-10-CM

## 2020-08-06 NOTE — Progress Notes (Signed)
She presents today chief complaint of a painful porokeratotic lesion third digit right foot.  No other complaints.  Objective: Pulses are palpable.  Reactive hyperkeratotic lesion to an adductovarus rotated hammertoe deformity fifth right  Assessment: Porokeratosis fifth right.  Plan: Debrided the area today follow-up with her as needed placed padding.

## 2020-08-28 ENCOUNTER — Other Ambulatory Visit: Payer: Self-pay | Admitting: Cardiovascular Disease

## 2020-09-08 ENCOUNTER — Other Ambulatory Visit: Payer: Self-pay | Admitting: Cardiovascular Disease

## 2020-09-17 ENCOUNTER — Ambulatory Visit: Payer: Medicare Other | Admitting: Podiatry

## 2020-09-17 ENCOUNTER — Other Ambulatory Visit: Payer: Self-pay

## 2020-09-17 ENCOUNTER — Encounter: Payer: Self-pay | Admitting: Podiatry

## 2020-09-17 DIAGNOSIS — Q828 Other specified congenital malformations of skin: Secondary | ICD-10-CM

## 2020-09-17 DIAGNOSIS — D689 Coagulation defect, unspecified: Secondary | ICD-10-CM | POA: Diagnosis not present

## 2020-09-19 NOTE — Progress Notes (Signed)
She presents today for follow-up of her painful fifth digit of her right foot.  States that she needs to have her corn trimmed again.  Objective: Vital signs are stable she is alert and oriented x3.  Pulses are palpable.  She is under lapping fifth digit on the right foot with a reactive hyperkeratotic tissue to the dorsal medial aspect of the fifth toe secondary to his juxtaposition to the fourth.  Assessment: Painful poor keratoma fifth digit right foot.  Plan: Debrided the area today and placed padding.  No iatrogenic lesions were noted.  Follow-up with her in 6 weeks

## 2020-09-27 ENCOUNTER — Other Ambulatory Visit: Payer: Self-pay | Admitting: Cardiovascular Disease

## 2020-09-29 DIAGNOSIS — H5203 Hypermetropia, bilateral: Secondary | ICD-10-CM | POA: Diagnosis not present

## 2020-09-29 DIAGNOSIS — E119 Type 2 diabetes mellitus without complications: Secondary | ICD-10-CM | POA: Diagnosis not present

## 2020-09-29 DIAGNOSIS — H2513 Age-related nuclear cataract, bilateral: Secondary | ICD-10-CM | POA: Diagnosis not present

## 2020-10-02 ENCOUNTER — Other Ambulatory Visit: Payer: Self-pay | Admitting: Nurse Practitioner

## 2020-10-02 DIAGNOSIS — E039 Hypothyroidism, unspecified: Secondary | ICD-10-CM

## 2020-10-02 NOTE — Telephone Encounter (Signed)
Called patient to schedule appointment for follow up, no answer LM to call back and schedule appointment.

## 2020-10-22 ENCOUNTER — Other Ambulatory Visit: Payer: Self-pay | Admitting: Cardiovascular Disease

## 2020-10-25 ENCOUNTER — Other Ambulatory Visit: Payer: Self-pay | Admitting: Nurse Practitioner

## 2020-10-25 DIAGNOSIS — E039 Hypothyroidism, unspecified: Secondary | ICD-10-CM

## 2020-10-29 ENCOUNTER — Other Ambulatory Visit: Payer: Self-pay | Admitting: Cardiovascular Disease

## 2020-10-29 NOTE — Telephone Encounter (Signed)
Last VV 10/10/20   Last fill 10/02/20  #30/0 Pt need a follow up visit

## 2020-11-03 ENCOUNTER — Ambulatory Visit: Payer: Medicare Other | Admitting: Podiatry

## 2020-11-10 DIAGNOSIS — E1142 Type 2 diabetes mellitus with diabetic polyneuropathy: Secondary | ICD-10-CM | POA: Diagnosis not present

## 2020-11-10 DIAGNOSIS — E890 Postprocedural hypothyroidism: Secondary | ICD-10-CM | POA: Diagnosis not present

## 2020-11-10 DIAGNOSIS — Z79899 Other long term (current) drug therapy: Secondary | ICD-10-CM | POA: Diagnosis not present

## 2020-11-10 DIAGNOSIS — E0520 Thyrotoxicosis with toxic multinodular goiter without thyrotoxic crisis or storm: Secondary | ICD-10-CM | POA: Diagnosis not present

## 2020-11-10 DIAGNOSIS — Z794 Long term (current) use of insulin: Secondary | ICD-10-CM | POA: Diagnosis not present

## 2020-11-10 DIAGNOSIS — E1165 Type 2 diabetes mellitus with hyperglycemia: Secondary | ICD-10-CM | POA: Diagnosis not present

## 2020-11-10 DIAGNOSIS — Z7984 Long term (current) use of oral hypoglycemic drugs: Secondary | ICD-10-CM | POA: Diagnosis not present

## 2020-11-10 LAB — LIPID PANEL
Cholesterol: 153 (ref 0–200)
HDL: 64 (ref 35–70)
LDL Cholesterol: 74
LDl/HDL Ratio: 2.4
Triglycerides: 80 (ref 40–160)

## 2020-11-10 LAB — HEPATIC FUNCTION PANEL
ALT: 19 (ref 7–35)
AST: 16 (ref 13–35)
Bilirubin, Total: 0.4

## 2020-11-10 LAB — BASIC METABOLIC PANEL
BUN: 25 — AB (ref 4–21)
CO2: 33 — AB (ref 13–22)
Chloride: 103 (ref 99–108)
Creatinine: 0.9 (ref <=1.1)
Glucose: 94
Potassium: 4.3 (ref 3.4–5.3)
Sodium: 144 (ref 137–147)

## 2020-11-10 LAB — VITAMIN B12: Vitamin B-12: 768

## 2020-11-10 LAB — COMPREHENSIVE METABOLIC PANEL
Calcium: 10.3 (ref 8.7–10.7)
GFR calc Af Amer: 77
GFR calc non Af Amer: 63

## 2020-11-10 LAB — HEMOGLOBIN A1C: Hemoglobin A1C: 7.4

## 2020-11-10 LAB — TSH: TSH: 1.1 (ref <=5.90)

## 2020-11-17 ENCOUNTER — Other Ambulatory Visit: Payer: Self-pay

## 2020-11-17 ENCOUNTER — Ambulatory Visit: Payer: Medicare Other | Admitting: Podiatry

## 2020-11-17 DIAGNOSIS — Q828 Other specified congenital malformations of skin: Secondary | ICD-10-CM | POA: Diagnosis not present

## 2020-11-17 DIAGNOSIS — D689 Coagulation defect, unspecified: Secondary | ICD-10-CM | POA: Diagnosis not present

## 2020-11-18 NOTE — Progress Notes (Signed)
She presents today for painful callus fifth digit of the right foot secondary to hammertoe deformity.  Objective: Vital signs are stable alert and oriented x3 pulses remain palpable no open lesions or wounds.  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  Plan: Debridement of the wound today Place padding.  Follow-up with her in 2 months.

## 2020-12-29 ENCOUNTER — Ambulatory Visit: Payer: Medicare Other | Admitting: Podiatry

## 2020-12-29 ENCOUNTER — Other Ambulatory Visit: Payer: Self-pay

## 2020-12-29 DIAGNOSIS — Q828 Other specified congenital malformations of skin: Secondary | ICD-10-CM | POA: Diagnosis not present

## 2020-12-29 DIAGNOSIS — D689 Coagulation defect, unspecified: Secondary | ICD-10-CM

## 2020-12-29 NOTE — Progress Notes (Signed)
She presents today chief complaint of painful callus fifth digit of the right foot.  Objective: Vital signs are stable she alert and x3 pulses palpable right foot.  No erythema edema cellulitis drainage or odor reactive hyper keratoma dorsal medial aspect of the DIPJ fifth digit right foot secondary to adductovarus juxtaposition of the fourth and fifth toes.  Assessment: Pain in limb secondary to painful callus.  Plan: Debridement of benign hyperkeratotic skin lesion.  Follow-up with her in 6 weeks.

## 2021-01-18 DIAGNOSIS — S36030A Superficial (capsular) laceration of spleen, initial encounter: Secondary | ICD-10-CM | POA: Diagnosis not present

## 2021-01-18 DIAGNOSIS — R52 Pain, unspecified: Secondary | ICD-10-CM | POA: Diagnosis not present

## 2021-01-18 DIAGNOSIS — R1310 Dysphagia, unspecified: Secondary | ICD-10-CM | POA: Diagnosis not present

## 2021-01-18 DIAGNOSIS — M47816 Spondylosis without myelopathy or radiculopathy, lumbar region: Secondary | ICD-10-CM | POA: Diagnosis not present

## 2021-01-18 DIAGNOSIS — S53144A Lateral dislocation of right ulnohumeral joint, initial encounter: Secondary | ICD-10-CM | POA: Diagnosis not present

## 2021-01-18 DIAGNOSIS — J449 Chronic obstructive pulmonary disease, unspecified: Secondary | ICD-10-CM | POA: Diagnosis not present

## 2021-01-18 DIAGNOSIS — S53104A Unspecified dislocation of right ulnohumeral joint, initial encounter: Secondary | ICD-10-CM | POA: Diagnosis not present

## 2021-01-18 DIAGNOSIS — I10 Essential (primary) hypertension: Secondary | ICD-10-CM | POA: Diagnosis not present

## 2021-01-18 DIAGNOSIS — R569 Unspecified convulsions: Secondary | ICD-10-CM | POA: Diagnosis not present

## 2021-01-18 DIAGNOSIS — E785 Hyperlipidemia, unspecified: Secondary | ICD-10-CM | POA: Diagnosis not present

## 2021-01-18 DIAGNOSIS — Z794 Long term (current) use of insulin: Secondary | ICD-10-CM | POA: Diagnosis not present

## 2021-01-18 DIAGNOSIS — S3993XA Unspecified injury of pelvis, initial encounter: Secondary | ICD-10-CM | POA: Diagnosis not present

## 2021-01-18 DIAGNOSIS — M2578 Osteophyte, vertebrae: Secondary | ICD-10-CM | POA: Diagnosis not present

## 2021-01-18 DIAGNOSIS — M25421 Effusion, right elbow: Secondary | ICD-10-CM | POA: Diagnosis not present

## 2021-01-18 DIAGNOSIS — S29009A Unspecified injury of muscle and tendon of unspecified wall of thorax, initial encounter: Secondary | ICD-10-CM | POA: Diagnosis not present

## 2021-01-18 DIAGNOSIS — D496 Neoplasm of unspecified behavior of brain: Secondary | ICD-10-CM | POA: Diagnosis not present

## 2021-01-18 DIAGNOSIS — S0990XA Unspecified injury of head, initial encounter: Secondary | ICD-10-CM | POA: Diagnosis not present

## 2021-01-18 DIAGNOSIS — S31010A Laceration without foreign body of lower back and pelvis without penetration into retroperitoneum, initial encounter: Secondary | ICD-10-CM | POA: Diagnosis not present

## 2021-01-18 DIAGNOSIS — I361 Nonrheumatic tricuspid (valve) insufficiency: Secondary | ICD-10-CM | POA: Diagnosis not present

## 2021-01-18 DIAGNOSIS — I629 Nontraumatic intracranial hemorrhage, unspecified: Secondary | ICD-10-CM | POA: Diagnosis not present

## 2021-01-18 DIAGNOSIS — S299XXA Unspecified injury of thorax, initial encounter: Secondary | ICD-10-CM | POA: Diagnosis not present

## 2021-01-18 DIAGNOSIS — I499 Cardiac arrhythmia, unspecified: Secondary | ICD-10-CM | POA: Diagnosis not present

## 2021-01-18 DIAGNOSIS — R9401 Abnormal electroencephalogram [EEG]: Secondary | ICD-10-CM | POA: Diagnosis not present

## 2021-01-18 DIAGNOSIS — J9811 Atelectasis: Secondary | ICD-10-CM | POA: Diagnosis not present

## 2021-01-18 DIAGNOSIS — E2749 Other adrenocortical insufficiency: Secondary | ICD-10-CM | POA: Diagnosis not present

## 2021-01-18 DIAGNOSIS — G9389 Other specified disorders of brain: Secondary | ICD-10-CM | POA: Diagnosis not present

## 2021-01-18 DIAGNOSIS — S32059A Unspecified fracture of fifth lumbar vertebra, initial encounter for closed fracture: Secondary | ICD-10-CM | POA: Diagnosis not present

## 2021-01-18 DIAGNOSIS — C711 Malignant neoplasm of frontal lobe: Secondary | ICD-10-CM | POA: Diagnosis not present

## 2021-01-18 DIAGNOSIS — C719 Malignant neoplasm of brain, unspecified: Secondary | ICD-10-CM | POA: Diagnosis not present

## 2021-01-18 DIAGNOSIS — J90 Pleural effusion, not elsewhere classified: Secondary | ICD-10-CM | POA: Diagnosis not present

## 2021-01-18 DIAGNOSIS — R4020 Unspecified coma: Secondary | ICD-10-CM | POA: Diagnosis not present

## 2021-01-18 DIAGNOSIS — S36031A Moderate laceration of spleen, initial encounter: Secondary | ICD-10-CM | POA: Diagnosis not present

## 2021-01-18 DIAGNOSIS — D62 Acute posthemorrhagic anemia: Secondary | ICD-10-CM | POA: Diagnosis not present

## 2021-01-18 DIAGNOSIS — E119 Type 2 diabetes mellitus without complications: Secondary | ICD-10-CM | POA: Diagnosis not present

## 2021-01-18 DIAGNOSIS — S53004A Unspecified dislocation of right radial head, initial encounter: Secondary | ICD-10-CM | POA: Diagnosis not present

## 2021-01-18 DIAGNOSIS — G936 Cerebral edema: Secondary | ICD-10-CM | POA: Diagnosis not present

## 2021-01-18 DIAGNOSIS — M50322 Other cervical disc degeneration at C5-C6 level: Secondary | ICD-10-CM | POA: Diagnosis not present

## 2021-01-18 DIAGNOSIS — Z66 Do not resuscitate: Secondary | ICD-10-CM | POA: Diagnosis not present

## 2021-01-18 DIAGNOSIS — E278 Other specified disorders of adrenal gland: Secondary | ICD-10-CM | POA: Diagnosis not present

## 2021-01-18 DIAGNOSIS — I517 Cardiomegaly: Secondary | ICD-10-CM | POA: Diagnosis not present

## 2021-01-18 DIAGNOSIS — E876 Hypokalemia: Secondary | ICD-10-CM | POA: Diagnosis not present

## 2021-01-18 DIAGNOSIS — I82409 Acute embolism and thrombosis of unspecified deep veins of unspecified lower extremity: Secondary | ICD-10-CM | POA: Diagnosis not present

## 2021-01-18 DIAGNOSIS — S199XXA Unspecified injury of neck, initial encounter: Secondary | ICD-10-CM | POA: Diagnosis not present

## 2021-01-18 DIAGNOSIS — E039 Hypothyroidism, unspecified: Secondary | ICD-10-CM | POA: Diagnosis not present

## 2021-01-18 DIAGNOSIS — S42401A Unspecified fracture of lower end of right humerus, initial encounter for closed fracture: Secondary | ICD-10-CM | POA: Diagnosis not present

## 2021-01-18 DIAGNOSIS — S36039A Unspecified laceration of spleen, initial encounter: Secondary | ICD-10-CM | POA: Diagnosis not present

## 2021-01-18 DIAGNOSIS — R9389 Abnormal findings on diagnostic imaging of other specified body structures: Secondary | ICD-10-CM | POA: Diagnosis not present

## 2021-01-18 DIAGNOSIS — S53124A Posterior dislocation of right ulnohumeral joint, initial encounter: Secondary | ICD-10-CM | POA: Diagnosis not present

## 2021-01-18 DIAGNOSIS — I272 Pulmonary hypertension, unspecified: Secondary | ICD-10-CM | POA: Diagnosis not present

## 2021-01-18 DIAGNOSIS — Z20822 Contact with and (suspected) exposure to covid-19: Secondary | ICD-10-CM | POA: Diagnosis not present

## 2021-01-18 DIAGNOSIS — M4800 Spinal stenosis, site unspecified: Secondary | ICD-10-CM | POA: Diagnosis not present

## 2021-01-18 DIAGNOSIS — S0003XA Contusion of scalp, initial encounter: Secondary | ICD-10-CM | POA: Diagnosis not present

## 2021-01-18 DIAGNOSIS — Z743 Need for continuous supervision: Secondary | ICD-10-CM | POA: Diagnosis not present

## 2021-01-18 DIAGNOSIS — S32028A Other fracture of second lumbar vertebra, initial encounter for closed fracture: Secondary | ICD-10-CM | POA: Diagnosis not present

## 2021-01-18 DIAGNOSIS — M5136 Other intervertebral disc degeneration, lumbar region: Secondary | ICD-10-CM | POA: Diagnosis not present

## 2021-01-18 DIAGNOSIS — M160 Bilateral primary osteoarthritis of hip: Secondary | ICD-10-CM | POA: Diagnosis not present

## 2021-01-18 DIAGNOSIS — Z515 Encounter for palliative care: Secondary | ICD-10-CM | POA: Diagnosis not present

## 2021-01-18 DIAGNOSIS — S2242XA Multiple fractures of ribs, left side, initial encounter for closed fracture: Secondary | ICD-10-CM | POA: Diagnosis not present

## 2021-01-18 DIAGNOSIS — K5730 Diverticulosis of large intestine without perforation or abscess without bleeding: Secondary | ICD-10-CM | POA: Diagnosis not present

## 2021-01-18 DIAGNOSIS — I4891 Unspecified atrial fibrillation: Secondary | ICD-10-CM | POA: Diagnosis not present

## 2021-01-18 DIAGNOSIS — S06360A Traumatic hemorrhage of cerebrum, unspecified, without loss of consciousness, initial encounter: Secondary | ICD-10-CM | POA: Diagnosis not present

## 2021-01-18 DIAGNOSIS — R918 Other nonspecific abnormal finding of lung field: Secondary | ICD-10-CM | POA: Diagnosis not present

## 2021-01-18 DIAGNOSIS — I82403 Acute embolism and thrombosis of unspecified deep veins of lower extremity, bilateral: Secondary | ICD-10-CM | POA: Diagnosis not present

## 2021-01-19 DIAGNOSIS — S53104A Unspecified dislocation of right ulnohumeral joint, initial encounter: Secondary | ICD-10-CM | POA: Diagnosis not present

## 2021-01-19 DIAGNOSIS — S36030A Superficial (capsular) laceration of spleen, initial encounter: Secondary | ICD-10-CM | POA: Diagnosis not present

## 2021-01-19 DIAGNOSIS — S2242XA Multiple fractures of ribs, left side, initial encounter for closed fracture: Secondary | ICD-10-CM | POA: Diagnosis not present

## 2021-01-19 DIAGNOSIS — D62 Acute posthemorrhagic anemia: Secondary | ICD-10-CM | POA: Diagnosis not present

## 2021-01-19 DIAGNOSIS — S29009A Unspecified injury of muscle and tendon of unspecified wall of thorax, initial encounter: Secondary | ICD-10-CM | POA: Diagnosis not present

## 2021-01-19 DIAGNOSIS — M25421 Effusion, right elbow: Secondary | ICD-10-CM | POA: Diagnosis not present

## 2021-01-20 DIAGNOSIS — S2242XA Multiple fractures of ribs, left side, initial encounter for closed fracture: Secondary | ICD-10-CM | POA: Diagnosis not present

## 2021-01-20 DIAGNOSIS — S36030A Superficial (capsular) laceration of spleen, initial encounter: Secondary | ICD-10-CM | POA: Diagnosis not present

## 2021-01-20 DIAGNOSIS — S32028A Other fracture of second lumbar vertebra, initial encounter for closed fracture: Secondary | ICD-10-CM | POA: Diagnosis not present

## 2021-01-20 DIAGNOSIS — G9389 Other specified disorders of brain: Secondary | ICD-10-CM | POA: Diagnosis not present

## 2021-01-21 DIAGNOSIS — J9811 Atelectasis: Secondary | ICD-10-CM | POA: Diagnosis not present

## 2021-01-21 DIAGNOSIS — J90 Pleural effusion, not elsewhere classified: Secondary | ICD-10-CM | POA: Diagnosis not present

## 2021-01-22 DIAGNOSIS — R569 Unspecified convulsions: Secondary | ICD-10-CM | POA: Diagnosis not present

## 2021-01-22 DIAGNOSIS — R52 Pain, unspecified: Secondary | ICD-10-CM | POA: Diagnosis not present

## 2021-01-22 DIAGNOSIS — I82409 Acute embolism and thrombosis of unspecified deep veins of unspecified lower extremity: Secondary | ICD-10-CM | POA: Diagnosis not present

## 2021-01-22 DIAGNOSIS — R1310 Dysphagia, unspecified: Secondary | ICD-10-CM | POA: Diagnosis not present

## 2021-01-22 DIAGNOSIS — Z515 Encounter for palliative care: Secondary | ICD-10-CM | POA: Diagnosis not present

## 2021-01-25 DIAGNOSIS — R1310 Dysphagia, unspecified: Secondary | ICD-10-CM | POA: Diagnosis not present

## 2021-01-25 DIAGNOSIS — R52 Pain, unspecified: Secondary | ICD-10-CM | POA: Diagnosis not present

## 2021-01-25 DIAGNOSIS — Z515 Encounter for palliative care: Secondary | ICD-10-CM | POA: Diagnosis not present

## 2021-02-09 ENCOUNTER — Ambulatory Visit: Payer: Medicare Other | Admitting: Podiatry

## 2021-03-07 DEATH — deceased
